# Patient Record
Sex: Male | Born: 1945
Health system: Southern US, Community
[De-identification: ages and names within clinical notes are randomized; demographics above are authoritative.]

## PROBLEM LIST (undated history)

## (undated) DIAGNOSIS — I10 Essential (primary) hypertension: Principal | ICD-10-CM

## (undated) DIAGNOSIS — E119 Type 2 diabetes mellitus without complications: Secondary | ICD-10-CM

## (undated) DIAGNOSIS — I2510 Atherosclerotic heart disease of native coronary artery without angina pectoris: Secondary | ICD-10-CM

## (undated) DIAGNOSIS — I208 Other forms of angina pectoris: Secondary | ICD-10-CM

## (undated) DIAGNOSIS — Z794 Long term (current) use of insulin: Secondary | ICD-10-CM

## (undated) DIAGNOSIS — Z87442 Personal history of urinary calculi: Secondary | ICD-10-CM

## (undated) DIAGNOSIS — M1990 Unspecified osteoarthritis, unspecified site: Secondary | ICD-10-CM

## (undated) DIAGNOSIS — E785 Hyperlipidemia, unspecified: Secondary | ICD-10-CM

## (undated) HISTORY — PX: CORONARY ANGIOPLASTY: SHX604

## (undated) HISTORY — DX: Long term (current) use of insulin: Z79.4

## (undated) HISTORY — DX: Type 2 diabetes mellitus without complications: E11.9

## (undated) HISTORY — DX: Essential (primary) hypertension: I10

## (undated) HISTORY — DX: Atherosclerotic heart disease of native coronary artery without angina pectoris: I25.10

## (undated) HISTORY — DX: Other forms of angina pectoris: I20.8

## (undated) HISTORY — PX: APPENDECTOMY: SHX54

## (undated) HISTORY — PX: TONSILLECTOMY: SUR1361

---

## 2004-06-29 ENCOUNTER — Encounter: Admission: RE | Admit: 2004-06-29 | Discharge: 2004-09-27 | Payer: Self-pay | Admitting: Family Medicine

## 2009-08-26 ENCOUNTER — Inpatient Hospital Stay (HOSPITAL_COMMUNITY): Admission: EM | Admit: 2009-08-26 | Discharge: 2009-08-29 | Payer: Self-pay | Admitting: Emergency Medicine

## 2010-06-05 LAB — CROSSMATCH: Antibody Screen: NEGATIVE

## 2010-06-05 LAB — DIFFERENTIAL
Basophils Absolute: 0 10*3/uL (ref 0.0–0.1)
Eosinophils Relative: 0 % (ref 0–5)
Lymphocytes Relative: 18 % (ref 12–46)
Lymphocytes Relative: 32 % (ref 12–46)
Lymphs Abs: 1.1 10*3/uL (ref 0.7–4.0)
Monocytes Absolute: 0.3 10*3/uL (ref 0.1–1.0)
Monocytes Absolute: 0.5 10*3/uL (ref 0.1–1.0)
Monocytes Relative: 9 % (ref 3–12)
Neutro Abs: 2 10*3/uL (ref 1.7–7.7)
Neutro Abs: 4.4 10*3/uL (ref 1.7–7.7)

## 2010-06-05 LAB — POCT I-STAT, CHEM 8
BUN: 37 mg/dL — ABNORMAL HIGH (ref 6–23)
BUN: 39 mg/dL — ABNORMAL HIGH (ref 6–23)
Calcium, Ion: 1.07 mmol/L — ABNORMAL LOW (ref 1.12–1.32)
Calcium, Ion: 1.14 mmol/L (ref 1.12–1.32)
Chloride: 106 mEq/L (ref 96–112)
Chloride: 111 mEq/L (ref 96–112)
Creatinine, Ser: 0.8 mg/dL (ref 0.4–1.5)
Glucose, Bld: 241 mg/dL — ABNORMAL HIGH (ref 70–99)
HCT: 20 % — ABNORMAL LOW (ref 39.0–52.0)
Potassium: 3.9 mEq/L (ref 3.5–5.1)
Sodium: 137 mEq/L (ref 135–145)

## 2010-06-05 LAB — PREPARE RBC (CROSSMATCH)

## 2010-06-05 LAB — HEMOGLOBIN A1C
Hgb A1c MFr Bld: 6.1 % — ABNORMAL HIGH (ref <5.7)
Mean Plasma Glucose: 128 mg/dL — ABNORMAL HIGH (ref <117)

## 2010-06-05 LAB — CBC
HCT: 21.5 % — ABNORMAL LOW (ref 39.0–52.0)
HCT: 30.8 % — ABNORMAL LOW (ref 39.0–52.0)
Hemoglobin: 11 g/dL — ABNORMAL LOW (ref 13.0–17.0)
Hemoglobin: 7.7 g/dL — ABNORMAL LOW (ref 13.0–17.0)
Hemoglobin: 7.9 g/dL — ABNORMAL LOW (ref 13.0–17.0)
MCHC: 35.1 g/dL (ref 30.0–36.0)
MCHC: 36 g/dL (ref 30.0–36.0)
MCV: 91.7 fL (ref 78.0–100.0)
RBC: 2.37 MIL/uL — ABNORMAL LOW (ref 4.22–5.81)
RBC: 2.4 MIL/uL — ABNORMAL LOW (ref 4.22–5.81)
RBC: 2.43 MIL/uL — ABNORMAL LOW (ref 4.22–5.81)
RDW: 13.9 % (ref 11.5–15.5)
RDW: 14.3 % (ref 11.5–15.5)
WBC: 6.1 10*3/uL (ref 4.0–10.5)

## 2010-06-05 LAB — TSH: TSH: 1.156 u[IU]/mL (ref 0.350–4.500)

## 2010-06-05 LAB — BASIC METABOLIC PANEL
BUN: 19 mg/dL (ref 6–23)
CO2: 24 mEq/L (ref 19–32)
Chloride: 112 mEq/L (ref 96–112)
Creatinine, Ser: 0.87 mg/dL (ref 0.4–1.5)
GFR calc Af Amer: >60 mL/min (ref >60)
GFR calc non Af Amer: >60 mL/min (ref >60)
Glucose, Bld: 147 mg/dL — ABNORMAL HIGH (ref 70–99)
Glucose, Bld: 164 mg/dL — ABNORMAL HIGH (ref 70–99)
Potassium: 3.5 mEq/L (ref 3.5–5.1)
Sodium: 139 mEq/L (ref 135–145)

## 2010-06-05 LAB — GLUCOSE, CAPILLARY
Glucose-Capillary: 168 mg/dL — ABNORMAL HIGH (ref 70–99)
Glucose-Capillary: 247 mg/dL — ABNORMAL HIGH (ref 70–99)

## 2010-06-05 LAB — TROPONIN I: Troponin I: 0.01 ng/mL (ref 0.00–0.06)

## 2012-09-01 DIAGNOSIS — L570 Actinic keratosis: Secondary | ICD-10-CM | POA: Diagnosis not present

## 2012-09-01 DIAGNOSIS — D239 Other benign neoplasm of skin, unspecified: Secondary | ICD-10-CM | POA: Diagnosis not present

## 2012-09-01 DIAGNOSIS — D485 Neoplasm of uncertain behavior of skin: Secondary | ICD-10-CM | POA: Diagnosis not present

## 2012-09-01 DIAGNOSIS — Z85828 Personal history of other malignant neoplasm of skin: Secondary | ICD-10-CM | POA: Diagnosis not present

## 2012-09-02 DIAGNOSIS — D235 Other benign neoplasm of skin of trunk: Secondary | ICD-10-CM | POA: Diagnosis not present

## 2012-09-02 DIAGNOSIS — D0439 Carcinoma in situ of skin of other parts of face: Secondary | ICD-10-CM | POA: Diagnosis not present

## 2012-12-01 DIAGNOSIS — L570 Actinic keratosis: Secondary | ICD-10-CM | POA: Diagnosis not present

## 2012-12-01 DIAGNOSIS — D0439 Carcinoma in situ of skin of other parts of face: Secondary | ICD-10-CM | POA: Diagnosis not present

## 2013-04-28 DIAGNOSIS — E11319 Type 2 diabetes mellitus with unspecified diabetic retinopathy without macular edema: Secondary | ICD-10-CM | POA: Diagnosis not present

## 2013-04-28 DIAGNOSIS — E119 Type 2 diabetes mellitus without complications: Secondary | ICD-10-CM | POA: Diagnosis not present

## 2013-07-27 DIAGNOSIS — L570 Actinic keratosis: Secondary | ICD-10-CM | POA: Diagnosis not present

## 2013-07-27 DIAGNOSIS — Z85828 Personal history of other malignant neoplasm of skin: Secondary | ICD-10-CM | POA: Diagnosis not present

## 2013-07-27 DIAGNOSIS — D239 Other benign neoplasm of skin, unspecified: Secondary | ICD-10-CM | POA: Diagnosis not present

## 2013-08-19 DIAGNOSIS — L570 Actinic keratosis: Secondary | ICD-10-CM | POA: Diagnosis not present

## 2013-10-26 DIAGNOSIS — L570 Actinic keratosis: Secondary | ICD-10-CM | POA: Diagnosis not present

## 2013-10-26 DIAGNOSIS — Z85828 Personal history of other malignant neoplasm of skin: Secondary | ICD-10-CM | POA: Diagnosis not present

## 2013-10-27 DIAGNOSIS — I10 Essential (primary) hypertension: Secondary | ICD-10-CM | POA: Diagnosis not present

## 2013-10-27 DIAGNOSIS — E119 Type 2 diabetes mellitus without complications: Secondary | ICD-10-CM | POA: Diagnosis not present

## 2013-10-30 DIAGNOSIS — M549 Dorsalgia, unspecified: Secondary | ICD-10-CM | POA: Diagnosis not present

## 2013-10-30 DIAGNOSIS — Z0000 Encounter for general adult medical examination without abnormal findings: Secondary | ICD-10-CM | POA: Diagnosis not present

## 2013-10-30 DIAGNOSIS — Z23 Encounter for immunization: Secondary | ICD-10-CM | POA: Diagnosis not present

## 2013-10-30 DIAGNOSIS — I10 Essential (primary) hypertension: Secondary | ICD-10-CM | POA: Diagnosis not present

## 2013-10-30 DIAGNOSIS — Z86010 Personal history of colonic polyps: Secondary | ICD-10-CM | POA: Diagnosis not present

## 2013-10-30 DIAGNOSIS — E118 Type 2 diabetes mellitus with unspecified complications: Secondary | ICD-10-CM | POA: Diagnosis not present

## 2013-10-30 DIAGNOSIS — R809 Proteinuria, unspecified: Secondary | ICD-10-CM | POA: Diagnosis not present

## 2013-10-30 DIAGNOSIS — E785 Hyperlipidemia, unspecified: Secondary | ICD-10-CM | POA: Diagnosis not present

## 2013-10-30 DIAGNOSIS — L570 Actinic keratosis: Secondary | ICD-10-CM | POA: Diagnosis not present

## 2013-11-12 DIAGNOSIS — L570 Actinic keratosis: Secondary | ICD-10-CM | POA: Diagnosis not present

## 2014-01-13 DIAGNOSIS — L570 Actinic keratosis: Secondary | ICD-10-CM | POA: Diagnosis not present

## 2014-01-13 DIAGNOSIS — E785 Hyperlipidemia, unspecified: Secondary | ICD-10-CM | POA: Diagnosis not present

## 2014-01-13 DIAGNOSIS — E118 Type 2 diabetes mellitus with unspecified complications: Secondary | ICD-10-CM | POA: Diagnosis not present

## 2014-01-13 DIAGNOSIS — R300 Dysuria: Secondary | ICD-10-CM | POA: Diagnosis not present

## 2014-01-13 DIAGNOSIS — Z79899 Other long term (current) drug therapy: Secondary | ICD-10-CM | POA: Diagnosis not present

## 2014-01-21 DIAGNOSIS — S0502XA Injury of conjunctiva and corneal abrasion without foreign body, left eye, initial encounter: Secondary | ICD-10-CM | POA: Diagnosis not present

## 2014-04-15 DIAGNOSIS — E118 Type 2 diabetes mellitus with unspecified complications: Secondary | ICD-10-CM | POA: Diagnosis not present

## 2014-04-15 DIAGNOSIS — E782 Mixed hyperlipidemia: Secondary | ICD-10-CM | POA: Diagnosis not present

## 2014-04-15 DIAGNOSIS — I10 Essential (primary) hypertension: Secondary | ICD-10-CM | POA: Diagnosis not present

## 2014-04-15 DIAGNOSIS — R809 Proteinuria, unspecified: Secondary | ICD-10-CM | POA: Diagnosis not present

## 2014-08-09 DIAGNOSIS — L821 Other seborrheic keratosis: Secondary | ICD-10-CM | POA: Diagnosis not present

## 2014-08-09 DIAGNOSIS — D225 Melanocytic nevi of trunk: Secondary | ICD-10-CM | POA: Diagnosis not present

## 2014-08-09 DIAGNOSIS — L570 Actinic keratosis: Secondary | ICD-10-CM | POA: Diagnosis not present

## 2014-08-09 DIAGNOSIS — Z85828 Personal history of other malignant neoplasm of skin: Secondary | ICD-10-CM | POA: Diagnosis not present

## 2015-01-11 DIAGNOSIS — S61459A Open bite of unspecified hand, initial encounter: Secondary | ICD-10-CM | POA: Diagnosis not present

## 2015-02-03 DIAGNOSIS — Z7984 Long term (current) use of oral hypoglycemic drugs: Secondary | ICD-10-CM | POA: Diagnosis not present

## 2015-02-03 DIAGNOSIS — E119 Type 2 diabetes mellitus without complications: Secondary | ICD-10-CM | POA: Diagnosis not present

## 2015-02-08 DIAGNOSIS — E119 Type 2 diabetes mellitus without complications: Secondary | ICD-10-CM | POA: Diagnosis not present

## 2015-02-08 DIAGNOSIS — Z0000 Encounter for general adult medical examination without abnormal findings: Secondary | ICD-10-CM | POA: Diagnosis not present

## 2015-02-08 DIAGNOSIS — Z125 Encounter for screening for malignant neoplasm of prostate: Secondary | ICD-10-CM | POA: Diagnosis not present

## 2015-02-08 DIAGNOSIS — E1121 Type 2 diabetes mellitus with diabetic nephropathy: Secondary | ICD-10-CM | POA: Diagnosis not present

## 2015-02-08 DIAGNOSIS — D126 Benign neoplasm of colon, unspecified: Secondary | ICD-10-CM | POA: Diagnosis not present

## 2015-02-08 DIAGNOSIS — E785 Hyperlipidemia, unspecified: Secondary | ICD-10-CM | POA: Diagnosis not present

## 2015-02-08 DIAGNOSIS — I10 Essential (primary) hypertension: Secondary | ICD-10-CM | POA: Diagnosis not present

## 2015-02-08 DIAGNOSIS — R809 Proteinuria, unspecified: Secondary | ICD-10-CM | POA: Diagnosis not present

## 2015-02-08 DIAGNOSIS — Z7984 Long term (current) use of oral hypoglycemic drugs: Secondary | ICD-10-CM | POA: Diagnosis not present

## 2015-02-08 DIAGNOSIS — E782 Mixed hyperlipidemia: Secondary | ICD-10-CM | POA: Diagnosis not present

## 2015-04-05 DIAGNOSIS — R972 Elevated prostate specific antigen [PSA]: Secondary | ICD-10-CM | POA: Diagnosis not present

## 2015-05-04 DIAGNOSIS — R972 Elevated prostate specific antigen [PSA]: Secondary | ICD-10-CM | POA: Diagnosis not present

## 2015-06-14 DIAGNOSIS — Z0000 Encounter for general adult medical examination without abnormal findings: Secondary | ICD-10-CM | POA: Diagnosis not present

## 2015-06-14 DIAGNOSIS — R972 Elevated prostate specific antigen [PSA]: Secondary | ICD-10-CM | POA: Diagnosis not present

## 2015-08-08 DIAGNOSIS — E785 Hyperlipidemia, unspecified: Secondary | ICD-10-CM | POA: Diagnosis not present

## 2015-08-08 DIAGNOSIS — Z7984 Long term (current) use of oral hypoglycemic drugs: Secondary | ICD-10-CM | POA: Diagnosis not present

## 2015-08-08 DIAGNOSIS — E118 Type 2 diabetes mellitus with unspecified complications: Secondary | ICD-10-CM | POA: Diagnosis not present

## 2015-08-08 DIAGNOSIS — R809 Proteinuria, unspecified: Secondary | ICD-10-CM | POA: Diagnosis not present

## 2015-08-08 DIAGNOSIS — I10 Essential (primary) hypertension: Secondary | ICD-10-CM | POA: Diagnosis not present

## 2015-08-08 DIAGNOSIS — R972 Elevated prostate specific antigen [PSA]: Secondary | ICD-10-CM | POA: Diagnosis not present

## 2015-08-11 DIAGNOSIS — Z85828 Personal history of other malignant neoplasm of skin: Secondary | ICD-10-CM | POA: Diagnosis not present

## 2015-08-11 DIAGNOSIS — L570 Actinic keratosis: Secondary | ICD-10-CM | POA: Diagnosis not present

## 2015-08-11 DIAGNOSIS — D225 Melanocytic nevi of trunk: Secondary | ICD-10-CM | POA: Diagnosis not present

## 2015-08-11 DIAGNOSIS — L821 Other seborrheic keratosis: Secondary | ICD-10-CM | POA: Diagnosis not present

## 2015-11-01 DIAGNOSIS — R350 Frequency of micturition: Secondary | ICD-10-CM | POA: Diagnosis not present

## 2015-11-01 DIAGNOSIS — N401 Enlarged prostate with lower urinary tract symptoms: Secondary | ICD-10-CM | POA: Diagnosis not present

## 2015-11-02 DIAGNOSIS — L820 Inflamed seborrheic keratosis: Secondary | ICD-10-CM | POA: Diagnosis not present

## 2015-11-02 DIAGNOSIS — L570 Actinic keratosis: Secondary | ICD-10-CM | POA: Diagnosis not present

## 2015-11-02 DIAGNOSIS — D692 Other nonthrombocytopenic purpura: Secondary | ICD-10-CM | POA: Diagnosis not present

## 2015-11-02 DIAGNOSIS — D485 Neoplasm of uncertain behavior of skin: Secondary | ICD-10-CM | POA: Diagnosis not present

## 2015-11-17 DIAGNOSIS — L570 Actinic keratosis: Secondary | ICD-10-CM | POA: Diagnosis not present

## 2015-12-12 DIAGNOSIS — R972 Elevated prostate specific antigen [PSA]: Secondary | ICD-10-CM | POA: Diagnosis not present

## 2016-01-10 DIAGNOSIS — L570 Actinic keratosis: Secondary | ICD-10-CM | POA: Diagnosis not present

## 2016-01-10 DIAGNOSIS — Z23 Encounter for immunization: Secondary | ICD-10-CM | POA: Diagnosis not present

## 2016-02-13 DIAGNOSIS — Z7984 Long term (current) use of oral hypoglycemic drugs: Secondary | ICD-10-CM | POA: Diagnosis not present

## 2016-02-13 DIAGNOSIS — N401 Enlarged prostate with lower urinary tract symptoms: Secondary | ICD-10-CM | POA: Diagnosis not present

## 2016-02-13 DIAGNOSIS — E782 Mixed hyperlipidemia: Secondary | ICD-10-CM | POA: Diagnosis not present

## 2016-02-13 DIAGNOSIS — Z0000 Encounter for general adult medical examination without abnormal findings: Secondary | ICD-10-CM | POA: Diagnosis not present

## 2016-02-13 DIAGNOSIS — Z23 Encounter for immunization: Secondary | ICD-10-CM | POA: Diagnosis not present

## 2016-02-13 DIAGNOSIS — D126 Benign neoplasm of colon, unspecified: Secondary | ICD-10-CM | POA: Diagnosis not present

## 2016-02-13 DIAGNOSIS — R972 Elevated prostate specific antigen [PSA]: Secondary | ICD-10-CM | POA: Diagnosis not present

## 2016-02-13 DIAGNOSIS — I10 Essential (primary) hypertension: Secondary | ICD-10-CM | POA: Diagnosis not present

## 2016-02-13 DIAGNOSIS — E1121 Type 2 diabetes mellitus with diabetic nephropathy: Secondary | ICD-10-CM | POA: Diagnosis not present

## 2016-02-13 DIAGNOSIS — R809 Proteinuria, unspecified: Secondary | ICD-10-CM | POA: Diagnosis not present

## 2016-04-02 DIAGNOSIS — N401 Enlarged prostate with lower urinary tract symptoms: Secondary | ICD-10-CM | POA: Diagnosis not present

## 2016-04-02 DIAGNOSIS — E1121 Type 2 diabetes mellitus with diabetic nephropathy: Secondary | ICD-10-CM | POA: Diagnosis not present

## 2016-04-02 DIAGNOSIS — Z125 Encounter for screening for malignant neoplasm of prostate: Secondary | ICD-10-CM | POA: Diagnosis not present

## 2016-04-02 DIAGNOSIS — I10 Essential (primary) hypertension: Secondary | ICD-10-CM | POA: Diagnosis not present

## 2016-04-02 DIAGNOSIS — E782 Mixed hyperlipidemia: Secondary | ICD-10-CM | POA: Diagnosis not present

## 2016-04-02 DIAGNOSIS — Z7984 Long term (current) use of oral hypoglycemic drugs: Secondary | ICD-10-CM | POA: Diagnosis not present

## 2016-04-02 DIAGNOSIS — R809 Proteinuria, unspecified: Secondary | ICD-10-CM | POA: Diagnosis not present

## 2016-07-05 DIAGNOSIS — Z7984 Long term (current) use of oral hypoglycemic drugs: Secondary | ICD-10-CM | POA: Diagnosis not present

## 2016-07-05 DIAGNOSIS — E119 Type 2 diabetes mellitus without complications: Secondary | ICD-10-CM | POA: Diagnosis not present

## 2016-07-05 DIAGNOSIS — Z125 Encounter for screening for malignant neoplasm of prostate: Secondary | ICD-10-CM | POA: Diagnosis not present

## 2016-07-05 DIAGNOSIS — E782 Mixed hyperlipidemia: Secondary | ICD-10-CM | POA: Diagnosis not present

## 2016-07-05 DIAGNOSIS — I10 Essential (primary) hypertension: Secondary | ICD-10-CM | POA: Diagnosis not present

## 2016-07-05 DIAGNOSIS — M542 Cervicalgia: Secondary | ICD-10-CM | POA: Diagnosis not present

## 2016-07-05 DIAGNOSIS — R809 Proteinuria, unspecified: Secondary | ICD-10-CM | POA: Diagnosis not present

## 2016-07-05 DIAGNOSIS — E1121 Type 2 diabetes mellitus with diabetic nephropathy: Secondary | ICD-10-CM | POA: Diagnosis not present

## 2016-07-05 DIAGNOSIS — M4692 Unspecified inflammatory spondylopathy, cervical region: Secondary | ICD-10-CM | POA: Diagnosis not present

## 2016-08-27 DIAGNOSIS — E1121 Type 2 diabetes mellitus with diabetic nephropathy: Secondary | ICD-10-CM | POA: Diagnosis not present

## 2016-08-27 DIAGNOSIS — Z7984 Long term (current) use of oral hypoglycemic drugs: Secondary | ICD-10-CM | POA: Diagnosis not present

## 2016-08-27 DIAGNOSIS — I10 Essential (primary) hypertension: Secondary | ICD-10-CM | POA: Diagnosis not present

## 2016-08-27 DIAGNOSIS — E782 Mixed hyperlipidemia: Secondary | ICD-10-CM | POA: Diagnosis not present

## 2016-08-27 DIAGNOSIS — M542 Cervicalgia: Secondary | ICD-10-CM | POA: Diagnosis not present

## 2016-08-27 DIAGNOSIS — M4692 Unspecified inflammatory spondylopathy, cervical region: Secondary | ICD-10-CM | POA: Diagnosis not present

## 2016-08-27 DIAGNOSIS — E119 Type 2 diabetes mellitus without complications: Secondary | ICD-10-CM | POA: Diagnosis not present

## 2016-08-28 DIAGNOSIS — L570 Actinic keratosis: Secondary | ICD-10-CM | POA: Diagnosis not present

## 2016-08-28 DIAGNOSIS — Z808 Family history of malignant neoplasm of other organs or systems: Secondary | ICD-10-CM | POA: Diagnosis not present

## 2016-08-28 DIAGNOSIS — R58 Hemorrhage, not elsewhere classified: Secondary | ICD-10-CM | POA: Diagnosis not present

## 2016-08-28 DIAGNOSIS — Z85828 Personal history of other malignant neoplasm of skin: Secondary | ICD-10-CM | POA: Diagnosis not present

## 2016-08-30 DIAGNOSIS — M7741 Metatarsalgia, right foot: Secondary | ICD-10-CM | POA: Diagnosis not present

## 2016-09-05 DIAGNOSIS — M5412 Radiculopathy, cervical region: Secondary | ICD-10-CM | POA: Diagnosis not present

## 2016-09-20 ENCOUNTER — Ambulatory Visit (INDEPENDENT_AMBULATORY_CARE_PROVIDER_SITE_OTHER): Payer: Medicare Other

## 2016-09-20 ENCOUNTER — Ambulatory Visit (INDEPENDENT_AMBULATORY_CARE_PROVIDER_SITE_OTHER): Payer: Medicare Other | Admitting: Podiatry

## 2016-09-20 VITALS — BP 148/91 | HR 73

## 2016-09-20 DIAGNOSIS — M779 Enthesopathy, unspecified: Principal | ICD-10-CM

## 2016-09-20 DIAGNOSIS — M2041 Other hammer toe(s) (acquired), right foot: Secondary | ICD-10-CM

## 2016-09-20 DIAGNOSIS — M7751 Other enthesopathy of right foot: Secondary | ICD-10-CM | POA: Diagnosis not present

## 2016-09-20 DIAGNOSIS — M79671 Pain in right foot: Secondary | ICD-10-CM

## 2016-09-20 DIAGNOSIS — M778 Other enthesopathies, not elsewhere classified: Secondary | ICD-10-CM

## 2016-09-20 NOTE — Progress Notes (Signed)
He presents today with a chief complaint of pain to the forefoot right. He states has been bothering for about a year states that recently he cut the bottom out of the second metatarsal area of his right shoe. He says the last of all kidneys have pain in the second metatarsophalangeal joint for quite some time.  Objective: I have reviewed his past medical history medications allergy surgery social history. Vital signs are stable slightly elevated diastolic blood pressure. Neurologic sensorium is intact. Deep tendon reflexes are intact. Muscle strength normal symmetrical bilateral. Orthopedic evaluation demonstrates mildly contracted second metatarsophalangeal joint with medial deviation of the toes bilaterally right greater than left. He has pain on palpation of the second metatarsophalangeal joint.  Assessment: Capsulitis hammertoe deformity second right.  Plan: Placed padding to the plantar aspect of his insoles. Discussed injection therapy he will follow-up with me on an as-needed basis.

## 2016-10-22 DIAGNOSIS — M5412 Radiculopathy, cervical region: Secondary | ICD-10-CM | POA: Diagnosis not present

## 2016-10-23 DIAGNOSIS — M7751 Other enthesopathy of right foot: Secondary | ICD-10-CM | POA: Diagnosis not present

## 2016-10-23 DIAGNOSIS — G5761 Lesion of plantar nerve, right lower limb: Secondary | ICD-10-CM | POA: Diagnosis not present

## 2016-10-30 DIAGNOSIS — M7751 Other enthesopathy of right foot: Secondary | ICD-10-CM | POA: Diagnosis not present

## 2016-10-30 DIAGNOSIS — M542 Cervicalgia: Secondary | ICD-10-CM | POA: Diagnosis not present

## 2016-10-30 DIAGNOSIS — M4722 Other spondylosis with radiculopathy, cervical region: Secondary | ICD-10-CM | POA: Diagnosis not present

## 2016-10-30 DIAGNOSIS — G5761 Lesion of plantar nerve, right lower limb: Secondary | ICD-10-CM | POA: Diagnosis not present

## 2016-11-02 DIAGNOSIS — M542 Cervicalgia: Secondary | ICD-10-CM | POA: Diagnosis not present

## 2016-11-02 DIAGNOSIS — M4722 Other spondylosis with radiculopathy, cervical region: Secondary | ICD-10-CM | POA: Diagnosis not present

## 2016-11-06 DIAGNOSIS — M542 Cervicalgia: Secondary | ICD-10-CM | POA: Diagnosis not present

## 2016-11-06 DIAGNOSIS — M4722 Other spondylosis with radiculopathy, cervical region: Secondary | ICD-10-CM | POA: Diagnosis not present

## 2016-11-07 DIAGNOSIS — M7751 Other enthesopathy of right foot: Secondary | ICD-10-CM | POA: Diagnosis not present

## 2016-11-07 DIAGNOSIS — G5761 Lesion of plantar nerve, right lower limb: Secondary | ICD-10-CM | POA: Diagnosis not present

## 2016-11-09 DIAGNOSIS — M542 Cervicalgia: Secondary | ICD-10-CM | POA: Diagnosis not present

## 2016-11-09 DIAGNOSIS — M4722 Other spondylosis with radiculopathy, cervical region: Secondary | ICD-10-CM | POA: Diagnosis not present

## 2016-11-26 DIAGNOSIS — M542 Cervicalgia: Secondary | ICD-10-CM | POA: Diagnosis not present

## 2016-11-26 DIAGNOSIS — M4722 Other spondylosis with radiculopathy, cervical region: Secondary | ICD-10-CM | POA: Diagnosis not present

## 2016-11-27 DIAGNOSIS — E782 Mixed hyperlipidemia: Secondary | ICD-10-CM | POA: Diagnosis not present

## 2016-11-27 DIAGNOSIS — M4692 Unspecified inflammatory spondylopathy, cervical region: Secondary | ICD-10-CM | POA: Diagnosis not present

## 2016-11-27 DIAGNOSIS — Z7984 Long term (current) use of oral hypoglycemic drugs: Secondary | ICD-10-CM | POA: Diagnosis not present

## 2016-11-27 DIAGNOSIS — E1121 Type 2 diabetes mellitus with diabetic nephropathy: Secondary | ICD-10-CM | POA: Diagnosis not present

## 2016-11-27 DIAGNOSIS — I10 Essential (primary) hypertension: Secondary | ICD-10-CM | POA: Diagnosis not present

## 2016-11-27 DIAGNOSIS — F439 Reaction to severe stress, unspecified: Secondary | ICD-10-CM | POA: Diagnosis not present

## 2016-12-03 DIAGNOSIS — R350 Frequency of micturition: Secondary | ICD-10-CM | POA: Diagnosis not present

## 2016-12-04 ENCOUNTER — Other Ambulatory Visit: Payer: Self-pay | Admitting: Orthopedic Surgery

## 2016-12-04 DIAGNOSIS — M542 Cervicalgia: Secondary | ICD-10-CM

## 2016-12-12 DIAGNOSIS — M7751 Other enthesopathy of right foot: Secondary | ICD-10-CM | POA: Diagnosis not present

## 2016-12-12 DIAGNOSIS — G5761 Lesion of plantar nerve, right lower limb: Secondary | ICD-10-CM | POA: Diagnosis not present

## 2016-12-13 DIAGNOSIS — E119 Type 2 diabetes mellitus without complications: Secondary | ICD-10-CM | POA: Diagnosis not present

## 2016-12-13 DIAGNOSIS — R972 Elevated prostate specific antigen [PSA]: Secondary | ICD-10-CM | POA: Diagnosis not present

## 2016-12-13 DIAGNOSIS — T733XXA Exhaustion due to excessive exertion, initial encounter: Secondary | ICD-10-CM | POA: Diagnosis not present

## 2016-12-13 DIAGNOSIS — Z7984 Long term (current) use of oral hypoglycemic drugs: Secondary | ICD-10-CM | POA: Diagnosis not present

## 2016-12-13 DIAGNOSIS — Z23 Encounter for immunization: Secondary | ICD-10-CM | POA: Diagnosis not present

## 2016-12-13 DIAGNOSIS — I10 Essential (primary) hypertension: Secondary | ICD-10-CM | POA: Diagnosis not present

## 2016-12-13 DIAGNOSIS — R42 Dizziness and giddiness: Secondary | ICD-10-CM | POA: Diagnosis not present

## 2016-12-13 DIAGNOSIS — E782 Mixed hyperlipidemia: Secondary | ICD-10-CM | POA: Diagnosis not present

## 2016-12-13 DIAGNOSIS — E1121 Type 2 diabetes mellitus with diabetic nephropathy: Secondary | ICD-10-CM | POA: Diagnosis not present

## 2016-12-16 ENCOUNTER — Other Ambulatory Visit: Payer: Self-pay

## 2016-12-16 ENCOUNTER — Ambulatory Visit
Admission: RE | Admit: 2016-12-16 | Discharge: 2016-12-16 | Disposition: A | Discharge status: Home / Self Care | Payer: Self-pay | Source: Ambulatory Visit | Attending: Orthopedic Surgery | Admitting: Orthopedic Surgery

## 2016-12-16 DIAGNOSIS — M4802 Spinal stenosis, cervical region: Secondary | ICD-10-CM | POA: Diagnosis not present

## 2016-12-16 DIAGNOSIS — M542 Cervicalgia: Secondary | ICD-10-CM

## 2016-12-24 DIAGNOSIS — R6889 Other general symptoms and signs: Secondary | ICD-10-CM | POA: Diagnosis not present

## 2016-12-24 DIAGNOSIS — I10 Essential (primary) hypertension: Secondary | ICD-10-CM | POA: Diagnosis not present

## 2016-12-24 DIAGNOSIS — E119 Type 2 diabetes mellitus without complications: Secondary | ICD-10-CM | POA: Diagnosis not present

## 2016-12-24 DIAGNOSIS — Z794 Long term (current) use of insulin: Secondary | ICD-10-CM | POA: Diagnosis not present

## 2016-12-25 DIAGNOSIS — I208 Other forms of angina pectoris: Secondary | ICD-10-CM | POA: Diagnosis not present

## 2016-12-31 DIAGNOSIS — I208 Other forms of angina pectoris: Secondary | ICD-10-CM | POA: Diagnosis not present

## 2017-01-16 DIAGNOSIS — M47812 Spondylosis without myelopathy or radiculopathy, cervical region: Secondary | ICD-10-CM | POA: Diagnosis not present

## 2017-01-16 DIAGNOSIS — E119 Type 2 diabetes mellitus without complications: Secondary | ICD-10-CM | POA: Diagnosis not present

## 2017-01-16 DIAGNOSIS — M542 Cervicalgia: Secondary | ICD-10-CM | POA: Diagnosis not present

## 2017-01-16 DIAGNOSIS — M4722 Other spondylosis with radiculopathy, cervical region: Secondary | ICD-10-CM | POA: Diagnosis not present

## 2017-01-16 DIAGNOSIS — Z794 Long term (current) use of insulin: Secondary | ICD-10-CM | POA: Diagnosis not present

## 2017-01-16 DIAGNOSIS — I208 Other forms of angina pectoris: Secondary | ICD-10-CM | POA: Diagnosis not present

## 2017-01-16 DIAGNOSIS — R6889 Other general symptoms and signs: Secondary | ICD-10-CM | POA: Diagnosis not present

## 2017-01-21 DIAGNOSIS — G5761 Lesion of plantar nerve, right lower limb: Secondary | ICD-10-CM | POA: Diagnosis not present

## 2017-01-21 DIAGNOSIS — M7751 Other enthesopathy of right foot: Secondary | ICD-10-CM | POA: Diagnosis not present

## 2017-01-21 DIAGNOSIS — N401 Enlarged prostate with lower urinary tract symptoms: Secondary | ICD-10-CM | POA: Diagnosis not present

## 2017-01-21 DIAGNOSIS — R972 Elevated prostate specific antigen [PSA]: Secondary | ICD-10-CM | POA: Diagnosis not present

## 2017-01-21 DIAGNOSIS — I208 Other forms of angina pectoris: Secondary | ICD-10-CM | POA: Diagnosis not present

## 2017-01-21 DIAGNOSIS — R3912 Poor urinary stream: Secondary | ICD-10-CM | POA: Diagnosis not present

## 2017-01-23 DIAGNOSIS — R9439 Abnormal result of other cardiovascular function study: Secondary | ICD-10-CM

## 2017-01-23 NOTE — Progress Notes (Signed)
Labs 01/21/2017: BUN/creatinine 21/1.09.  EGFR 69.  Glucose 278 elevated.  Sodium 142, potassium 4.4. H/H 13.6/39.8.  MCV 93.  Platelets 202.  INR 1.1.

## 2017-01-23 NOTE — Progress Notes (Signed)
[name] 2016-12-30 8:41 AM Location: [facility] Patient #: [id number] DOB: [birth year] Divorced / Language: Cleophus Molt / Race: White Male   History of Present Illness Joya Gaskins Patwardhan MD; 12/30/16 2:06 PM) Patient words: NP EVAL for fatigue due to excessive exertion.  The patient is a 71 year old male who presents with fatigue. Patient referred by Yaakov Guthrie, M.D. for evaluation of fatigue and decreased exercise tolerance.  Mr. Nouri is a delightful 72 year old Caucasian male with new diagnosis hypertension, controlled type II diabetes mellitus, hyperlipidemia. 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 For the last few months, he has noticed markedly decreased exercise tolerance. He is taking longer to the same amount of exercise on ellipticals, and is requiring to break his crunches and 2-3 sessions every day. Few weeks ago, he was in [place] lifting heavy cases of antiques something which is done for many years. He noticed that he was getting tired easily. He was recently started on lisinopril 20 mg by Dr. Jacelyn Grip. Since then his blood pressure is better control. He does notice decreased appetite and brief episodes of lightheadedness since starting on lisinopril but is getting better. He denies any angina since this. He does endorse flashes of chest pain as he describes lasting for a few seconds , but nothing that is brought on by exertion or relieved with rest. He does not have any obvious shortness of breath. He denies any leg swelling or thought, PND, syncope.    Problem List/Past Medical Anderson Malta Sergeant; December 30, 2016 1:00 PM) Laboratory examination (Z01.89)  Benign essential hypertension (I10)  Controlled type 2  diabetes mellitus without complication, with long-term current use of insulin (E11.9)  Hyperlipidemia (E78.5)   Allergies Anderson Malta Sergeant; 12-30-16 1:00 PM) No Known Drug Allergies [12-30-16]:  Family History Anderson Malta Sergeant; December 30, 2016 1:01 PM) Mother  Deceased. at age 59 from Bladder cancer, No heart issues Father  Deceased. at age 97, cancer, no heart issues Brother 2  Younger, no heart issues  Social History Anderson Malta Sergeant; 30-Dec-2016 1:03 PM) Current tobacco use  Never smoker. Alcohol Use  Occasional alcohol use. Marital status  Single. Living Situation  Lives alone. Number of Children  0.  Past Surgical History Anderson Malta Sergeant; Dec 30, 2016 1:04 PM) Appendectomy [2008]: Tonsillectomy [1960]:  Medication History Anderson Malta Sergeant; 12/30/16 1:07 PM) Lisinopril (20MG  Tablet, 1 Oral daily) Active. MetFORMIN HCl (1000MG  Tablet, 1 Oral two times daily) Active. Aspirin (325MG  Tablet DR, 1 Oral daily) Active. Medications Reconciled (meds present)  Diagnostic Studies History Anderson Malta Sergeant; 30-Dec-2016 1:07 PM) Colonoscopy [2016]: removed polyps Endoscopy [2014]: ulcer Prostate testing [2016]:    Review of Systems Joya Gaskins Patwardhan MD; 2016/12/30 2:07 PM) General Not Present- Appetite Loss and Weight Gain. Respiratory Not Present- Chronic Cough and Wakes up from Sleep Wheezing or Short of Breath. Cardiovascular Present- Elevated Blood Pressure. Not Present- Chest Pain, Claudications, Difficulty Breathing Lying Down, Difficulty Breathing On Exertion, Edema and Orthopnea. Note: markedly decreased exercise tolerance Gastrointestinal Not Present- Black, Tarry Stool and Difficulty Swallowing. Musculoskeletal Present- Back Pain. Not Present- Decreased Range of Motion and Muscle Atrophy. Neurological Not Present- Attention Deficit. Note: right foot pain, likely neuroma. Working with a podiatrist. Psychiatric Not Present- Personality Changes and  Suicidal Ideation. Endocrine Not Present- Cold Intolerance  and Heat Intolerance. Hematology Not Present- Abnormal Bleeding. All other systems negative  Vitals Anderson Malta Sergeant; 12/24/2016 1:15 PM) 12/24/2016 12:58 PM Weight: 244.25 lb Height: 77in Body Surface Area: 2.43 m Body Mass Index: 28.96 kg/m  Pulse: 84 (Regular)  P.OX: 98% (Room air) BP: 125/76 (Sitting, Left Arm, Standard)       Physical Exam Joya Gaskins Patwardhan MD; 12/24/2016 2:08 PM) General Mental Status-Alert. General Appearance-Cooperative and Appears stated age. Build & Nutrition-Well built and Morbidly obese(well-built, 6\' 5"  tall).  Head and Neck Thyroid Gland Characteristics - normal size and consistency and no palpable nodules.  Chest and Lung Exam Chest and lung exam reveals -quiet, even and easy respiratory effort with no use of accessory muscles, non-tender and on auscultation, normal breath sounds, no adventitious sounds.  Cardiovascular Cardiovascular examination reveals -normal heart sounds, regular rate and rhythm with no murmurs, carotid auscultation reveals no bruits, abdominal aorta auscultation reveals no bruits and no prominent pulsation and femoral artery auscultation bilaterally reveals normal pulses, no bruits, no thrills.  Abdomen Palpation/Percussion Palpation and Percussion of the abdomen reveal - Non Tender and No hepatosplenomegaly.  Peripheral Vascular Lower Extremity Inspection - Bilateral - Inspection Normal. Palpation - Dorsalis pedis pulse - Bilateral - 2+. Posterior tibial pulse - Bilateral - 2+. Carotid arteries - Bilateral-No Carotid bruit.  Neurologic Neurologic evaluation reveals -alert and oriented x 3 with no impairment of recent or remote memory. Motor-Grossly intact without any focal deficits.  Musculoskeletal Global Assessment Left Lower Extremity - no deformities, masses or tenderness, no known fractures. Right Lower Extremity - no  deformities, masses or tenderness, no known fractures.    Assessment & Plan Joya Gaskins Patwardhan MD; 12/24/2016 2:14 PM) Decreased exercise tolerance (R68.89) Story: Possible angina equivalent in diabetic patient Current Plans Started Aspirin 81 81MG , 2 (two) Tablet daily, #180, 90 days starting 12/24/2016, Ref. x2. Controlled type 2 diabetes mellitus without complication, with long-term current use of insulin (E11.9) Benign essential hypertension (I10) Story: EKG 12/25/2016: Sinus arrhythmia 72 bpm. Normal axis. Borderline first-degree AV block. Otherwise normal conduction. Current Plans Complete electrocardiogram (93000) Mixed hyperlipidemia (E78.2)  Note:. Recommendations:  Markedly decreased exercise tolerance and an otherwise active 71 year old man with hypertension, hyperlipidemia, and type II diabetes mellitus. I'm concerned that this may be an angina equivalent. Recommend echocardiogram and exercise stress test with nuclear imaging. His blood pressure is well controlled on lisinopril 20 mg. I have asked him to decrease aspirin to 162 mg set of 325 mg to reduce bleeding risk. As long as no liver dysfunction, he can take Tylenol for his arthritis pain. Continue metformin for diabetes. Given his diabetes, and hyperlipidemia, his 10 year ASCVD risk is 40%. I recommend starting high intensity statin, but he is reluctant to start new medications at this time. We have mutually agreed to wait for the stress test and readdress the question of lipid lowering therapy. He is going to try and make changes to his diet.  He is planning to go to [place] on her trip in 2 weeks from now. He will get the ball testing done after that. I will see him after the test.  Cc Yaakov Guthrie, MD  Signed electronically by Vernell Leep, MD (12/24/2016 2:15 PM)

## 2017-01-28 ENCOUNTER — Encounter (HOSPITAL_COMMUNITY)
Admission: RE | Disposition: A | Discharge status: Home / Self Care | Payer: Self-pay | Source: Ambulatory Visit | Attending: Cardiology

## 2017-01-28 ENCOUNTER — Observation Stay (HOSPITAL_COMMUNITY)
Admission: RE | Admit: 2017-01-28 | Discharge: 2017-01-29 | Disposition: A | Discharge status: Home / Self Care | Payer: Medicare Other | Source: Ambulatory Visit | Attending: Cardiology | Admitting: Cardiology

## 2017-01-28 ENCOUNTER — Encounter (HOSPITAL_COMMUNITY): Payer: Self-pay | Admitting: Cardiology

## 2017-01-28 DIAGNOSIS — R9439 Abnormal result of other cardiovascular function study: Secondary | ICD-10-CM | POA: Diagnosis not present

## 2017-01-28 DIAGNOSIS — E119 Type 2 diabetes mellitus without complications: Secondary | ICD-10-CM | POA: Diagnosis not present

## 2017-01-28 DIAGNOSIS — E782 Mixed hyperlipidemia: Secondary | ICD-10-CM | POA: Insufficient documentation

## 2017-01-28 DIAGNOSIS — Z7982 Long term (current) use of aspirin: Secondary | ICD-10-CM | POA: Insufficient documentation

## 2017-01-28 DIAGNOSIS — Z9861 Coronary angioplasty status: Secondary | ICD-10-CM

## 2017-01-28 DIAGNOSIS — I208 Other forms of angina pectoris: Secondary | ICD-10-CM | POA: Diagnosis present

## 2017-01-28 DIAGNOSIS — I10 Essential (primary) hypertension: Secondary | ICD-10-CM | POA: Insufficient documentation

## 2017-01-28 DIAGNOSIS — Z955 Presence of coronary angioplasty implant and graft: Secondary | ICD-10-CM

## 2017-01-28 DIAGNOSIS — Z794 Long term (current) use of insulin: Secondary | ICD-10-CM | POA: Insufficient documentation

## 2017-01-28 DIAGNOSIS — I25118 Atherosclerotic heart disease of native coronary artery with other forms of angina pectoris: Secondary | ICD-10-CM | POA: Diagnosis not present

## 2017-01-28 DIAGNOSIS — R0609 Other forms of dyspnea: Secondary | ICD-10-CM | POA: Diagnosis not present

## 2017-01-28 HISTORY — PX: ULTRASOUND GUIDANCE FOR VASCULAR ACCESS: SHX6516

## 2017-01-28 HISTORY — DX: Hyperlipidemia, unspecified: E78.5

## 2017-01-28 HISTORY — DX: Personal history of urinary calculi: Z87.442

## 2017-01-28 HISTORY — PX: CORONARY STENT INTERVENTION: CATH118234

## 2017-01-28 HISTORY — DX: Essential (primary) hypertension: I10

## 2017-01-28 HISTORY — DX: Atherosclerotic heart disease of native coronary artery without angina pectoris: I25.10

## 2017-01-28 HISTORY — DX: Unspecified osteoarthritis, unspecified site: M19.90

## 2017-01-28 HISTORY — DX: Type 2 diabetes mellitus without complications: E11.9

## 2017-01-28 HISTORY — PX: LEFT HEART CATH AND CORONARY ANGIOGRAPHY: CATH118249

## 2017-01-28 LAB — GLUCOSE, CAPILLARY
GLUCOSE-CAPILLARY: 162 mg/dL — AB (ref 65–99)
GLUCOSE-CAPILLARY: 205 mg/dL — AB (ref 65–99)
Glucose-Capillary: 155 mg/dL — ABNORMAL HIGH (ref 65–99)
Glucose-Capillary: 201 mg/dL — ABNORMAL HIGH (ref 65–99)

## 2017-01-28 LAB — POCT ACTIVATED CLOTTING TIME
Activated Clotting Time: 230 seconds
Activated Clotting Time: 488 seconds
Activated Clotting Time: 731 seconds

## 2017-01-28 SURGERY — LEFT HEART CATH AND CORONARY ANGIOGRAPHY
Anesthesia: LOCAL

## 2017-01-28 MED ORDER — LABETALOL HCL 5 MG/ML IV SOLN
INTRAVENOUS | Status: DC | PRN
  Administered 2017-01-28: 10 mg via INTRAVENOUS

## 2017-01-28 MED ORDER — HYDRALAZINE HCL 20 MG/ML IJ SOLN
INTRAMUSCULAR | Status: AC
  Administered 2017-01-28: 10 mg via INTRAVENOUS
  Filled 2017-01-28: qty 1

## 2017-01-28 MED ORDER — HEPARIN SODIUM (PORCINE) 1000 UNIT/ML IJ SOLN
INTRAMUSCULAR | Status: AC
  Filled 2017-01-28: qty 1

## 2017-01-28 MED ORDER — SODIUM BICARBONATE 650 MG PO TABS
325.0000 mg | ORAL_TABLET | Freq: Every day | ORAL | Status: DC | PRN
  Filled 2017-01-28: qty 0.5

## 2017-01-28 MED ORDER — MIDAZOLAM HCL 2 MG/2ML IJ SOLN
INTRAMUSCULAR | Status: AC
  Filled 2017-01-28: qty 2

## 2017-01-28 MED ORDER — LISINOPRIL 10 MG PO TABS
20.0000 mg | ORAL_TABLET | Freq: Every day | ORAL | Status: DC
  Administered 2017-01-28 – 2017-01-29 (×2): 20 mg via ORAL
  Filled 2017-01-28: qty 1
  Filled 2017-01-28: qty 2

## 2017-01-28 MED ORDER — HEPARIN (PORCINE) IN NACL 2-0.9 UNIT/ML-% IJ SOLN
INTRAMUSCULAR | Status: AC
  Filled 2017-01-28: qty 1000

## 2017-01-28 MED ORDER — HEPARIN (PORCINE) IN NACL 2-0.9 UNIT/ML-% IJ SOLN
INTRAMUSCULAR | Status: AC | PRN
  Administered 2017-01-28: 1000 mL

## 2017-01-28 MED ORDER — SODIUM CHLORIDE 0.9 % IV SOLN: 250.0000 mL | INTRAVENOUS | Status: DC | PRN

## 2017-01-28 MED ORDER — VERAPAMIL HCL 2.5 MG/ML IV SOLN
INTRAVENOUS | Status: DC | PRN
  Administered 2017-01-28: 5 mL via INTRA_ARTERIAL

## 2017-01-28 MED ORDER — ANGIOPLASTY BOOK
Freq: Once | Status: AC
  Administered 2017-01-28: 1
  Filled 2017-01-28: qty 1

## 2017-01-28 MED ORDER — SODIUM CHLORIDE 0.9% FLUSH: 3.0000 mL | Freq: Two times a day (BID) | INTRAVENOUS | Status: DC

## 2017-01-28 MED ORDER — IOPAMIDOL (ISOVUE-370) INJECTION 76%
INTRAVENOUS | Status: AC
  Filled 2017-01-28: qty 100

## 2017-01-28 MED ORDER — IOPAMIDOL (ISOVUE-370) INJECTION 76%
INTRAVENOUS | Status: AC
  Filled 2017-01-28: qty 50

## 2017-01-28 MED ORDER — LABETALOL HCL 5 MG/ML IV SOLN
INTRAVENOUS | Status: AC
  Filled 2017-01-28: qty 4

## 2017-01-28 MED ORDER — MIDAZOLAM HCL 2 MG/2ML IJ SOLN
INTRAMUSCULAR | Status: DC | PRN
  Administered 2017-01-28 (×3): 1 mg via INTRAVENOUS

## 2017-01-28 MED ORDER — HYDRALAZINE HCL 20 MG/ML IJ SOLN
10.0000 mg | Freq: Once | INTRAMUSCULAR | Status: AC
  Administered 2017-01-28: 10 mg via INTRAVENOUS

## 2017-01-28 MED ORDER — CLOPIDOGREL BISULFATE 300 MG PO TABS
ORAL_TABLET | ORAL | Status: AC
  Filled 2017-01-28: qty 2

## 2017-01-28 MED ORDER — INSULIN ASPART 100 UNIT/ML ~~LOC~~ SOLN: 0.0000 [IU] | Freq: Every day | SUBCUTANEOUS | Status: DC

## 2017-01-28 MED ORDER — IOPAMIDOL (ISOVUE-370) INJECTION 76%
INTRAVENOUS | Status: AC
Start: 2017-01-28 — End: 2017-01-28
  Filled 2017-01-28: qty 100

## 2017-01-28 MED ORDER — METOPROLOL SUCCINATE ER 25 MG PO TB24: 25.0000 mg | ORAL_TABLET | Freq: Every day | ORAL | 3 refills | Status: DC

## 2017-01-28 MED ORDER — CLOPIDOGREL BISULFATE 75 MG PO TABS
75.0000 mg | ORAL_TABLET | Freq: Every day | ORAL | Status: DC
  Administered 2017-01-29: 09:00:00 75 mg via ORAL
  Filled 2017-01-28: qty 1

## 2017-01-28 MED ORDER — AMLODIPINE BESYLATE 5 MG PO TABS
5.0000 mg | ORAL_TABLET | Freq: Once | ORAL | Status: AC
  Administered 2017-01-28: 5 mg via ORAL
  Filled 2017-01-28: qty 1

## 2017-01-28 MED ORDER — AMLODIPINE BESYLATE 10 MG PO TABS
10.0000 mg | ORAL_TABLET | Freq: Every day | ORAL | Status: DC
  Administered 2017-01-29: 07:00:00 10 mg via ORAL
  Filled 2017-01-28 (×2): qty 1

## 2017-01-28 MED ORDER — LIDOCAINE HCL (PF) 1 % IJ SOLN
INTRAMUSCULAR | Status: AC
  Filled 2017-01-28: qty 30

## 2017-01-28 MED ORDER — CLOPIDOGREL BISULFATE 75 MG PO TABS: 75.0000 mg | ORAL_TABLET | Freq: Every day | ORAL | 3 refills | Status: DC

## 2017-01-28 MED ORDER — INSULIN ASPART 100 UNIT/ML ~~LOC~~ SOLN
0.0000 [IU] | Freq: Three times a day (TID) | SUBCUTANEOUS | Status: DC
  Administered 2017-01-29: 2 [IU] via SUBCUTANEOUS

## 2017-01-28 MED ORDER — ASPIRIN 81 MG PO CHEW
81.0000 mg | CHEWABLE_TABLET | ORAL | Status: AC
  Administered 2017-01-28: 81 mg via ORAL

## 2017-01-28 MED ORDER — SODIUM CHLORIDE 0.9% FLUSH: 3.0000 mL | INTRAVENOUS | Status: DC | PRN

## 2017-01-28 MED ORDER — HEPARIN SODIUM (PORCINE) 1000 UNIT/ML IJ SOLN
INTRAMUSCULAR | Status: DC | PRN
  Administered 2017-01-28 (×2): 3000 [IU] via INTRAVENOUS
  Administered 2017-01-28: 6000 [IU] via INTRAVENOUS
  Administered 2017-01-28: 2000 [IU] via INTRAVENOUS

## 2017-01-28 MED ORDER — METOPROLOL SUCCINATE ER 25 MG PO TB24
25.0000 mg | ORAL_TABLET | Freq: Every day | ORAL | Status: DC
  Administered 2017-01-28 – 2017-01-29 (×2): 25 mg via ORAL
  Filled 2017-01-28 (×2): qty 1

## 2017-01-28 MED ORDER — ASPIRIN 81 MG PO CHEW
CHEWABLE_TABLET | ORAL | Status: AC
  Administered 2017-01-28: 81 mg via ORAL
  Filled 2017-01-28: qty 1

## 2017-01-28 MED ORDER — CLOPIDOGREL BISULFATE 300 MG PO TABS
ORAL_TABLET | ORAL | Status: DC | PRN
  Administered 2017-01-28: 600 mg via ORAL

## 2017-01-28 MED ORDER — LABETALOL HCL 5 MG/ML IV SOLN
10.0000 mg | INTRAVENOUS | Status: AC | PRN
  Filled 2017-01-28: qty 4

## 2017-01-28 MED ORDER — ACETAMINOPHEN 325 MG PO TABS: 650.0000 mg | ORAL_TABLET | ORAL | Status: DC | PRN

## 2017-01-28 MED ORDER — LIDOCAINE HCL (PF) 1 % IJ SOLN
INTRAMUSCULAR | Status: DC | PRN
  Administered 2017-01-28: 2 mL

## 2017-01-28 MED ORDER — SODIUM CHLORIDE 0.9% FLUSH
3.0000 mL | Freq: Two times a day (BID) | INTRAVENOUS | Status: DC
  Administered 2017-01-28: 18:00:00 3 mL via INTRAVENOUS

## 2017-01-28 MED ORDER — ONDANSETRON HCL 4 MG/2ML IJ SOLN: 4.0000 mg | Freq: Four times a day (QID) | INTRAMUSCULAR | Status: DC | PRN

## 2017-01-28 MED ORDER — TAMSULOSIN HCL 0.4 MG PO CAPS
0.4000 mg | ORAL_CAPSULE | Freq: Every day | ORAL | Status: DC
  Administered 2017-01-28: 0.4 mg via ORAL
  Filled 2017-01-28: qty 1

## 2017-01-28 MED ORDER — IOPAMIDOL (ISOVUE-370) INJECTION 76%
INTRAVENOUS | Status: DC | PRN
  Administered 2017-01-28: 310 mL via INTRAVENOUS

## 2017-01-28 MED ORDER — SODIUM CHLORIDE 0.9 % IV SOLN: INTRAVENOUS | Status: AC

## 2017-01-28 MED ORDER — NICARDIPINE HCL IN NACL 20-0.86 MG/200ML-% IV SOLN
5.0000 mg/h | INTRAVENOUS | Status: DC
  Administered 2017-01-28: 2.5 mg/h via INTRAVENOUS
  Administered 2017-01-28: 17:00:00 5 mg/h via INTRAVENOUS
  Filled 2017-01-28 (×2): qty 200

## 2017-01-28 MED ORDER — FENTANYL CITRATE (PF) 100 MCG/2ML IJ SOLN
INTRAMUSCULAR | Status: DC | PRN
  Administered 2017-01-28 (×3): 25 ug via INTRAVENOUS

## 2017-01-28 MED ORDER — SODIUM CHLORIDE 0.9 % IV SOLN
INTRAVENOUS | Status: DC
  Administered 2017-01-28: 06:00:00 via INTRAVENOUS

## 2017-01-28 MED ORDER — VERAPAMIL HCL 2.5 MG/ML IV SOLN
INTRAVENOUS | Status: AC
  Filled 2017-01-28: qty 2

## 2017-01-28 MED ORDER — FENTANYL CITRATE (PF) 100 MCG/2ML IJ SOLN
INTRAMUSCULAR | Status: AC
  Filled 2017-01-28: qty 2

## 2017-01-28 SURGICAL SUPPLY — 26 items
BALLN EUPHORA RX 2.5X12 (BALLOONS) ×3
BALLN MINITREK OTW 1.5X6 (BALLOONS) ×3
BALLN ~~LOC~~ EUPHORA RX 4.5X12 (BALLOONS) ×3
BALLOON EUPHORA RX 2.5X12 (BALLOONS) IMPLANT
BALLOON MINITREK OTW 1.5X6 (BALLOONS) IMPLANT
BALLOON ~~LOC~~ EUPHORA RX 4.5X12 (BALLOONS) IMPLANT
CATH EXPO 5F FL3.5 (CATHETERS) ×1 IMPLANT
CATH INFINITI 5F JL4 125CM (CATHETERS) ×1 IMPLANT
CATH INFINITI 5FR JR4 125CM (CATHETERS) ×1 IMPLANT
CATH VISTA GUIDE 6FR JR4 (CATHETERS) ×1 IMPLANT
COVER PRB 48X5XTLSCP FOLD TPE (BAG) IMPLANT
COVER PROBE 5X48 (BAG) ×3
DEVICE RAD COMP TR BAND LRG (VASCULAR PRODUCTS) ×1 IMPLANT
GLIDESHEATH SLEND A-KIT 6F 20G (SHEATH) ×1 IMPLANT
GUIDEWIRE INQWIRE 1.5J.035X260 (WIRE) IMPLANT
INQWIRE 1.5J .035X260CM (WIRE) ×3
KIT ENCORE 26 ADVANTAGE (KITS) ×1 IMPLANT
KIT HEART LEFT (KITS) ×3 IMPLANT
PACK CARDIAC CATHETERIZATION (CUSTOM PROCEDURE TRAY) ×3 IMPLANT
STENT RESOLUTE ONYX 2.25X12 (Permanent Stent) ×1 IMPLANT
STENT RESOLUTE ONYX 4.0X30 (Permanent Stent) ×1 IMPLANT
TRANSDUCER W/STOPCOCK (MISCELLANEOUS) ×3 IMPLANT
TUBING CIL FLEX 10 FLL-RA (TUBING) ×3 IMPLANT
VALVE GUARDIAN II ~~LOC~~ HEMO (MISCELLANEOUS) ×1 IMPLANT
WIRE RUNTHROUGH .014X180CM (WIRE) ×1 IMPLANT
WIRE RUNTHROUGH .014X300CM (WIRE) ×1 IMPLANT

## 2017-01-28 NOTE — Progress Notes (Signed)
Per Dr Virgina Jock discontinued IV fluids

## 2017-01-28 NOTE — Progress Notes (Signed)
Report called to [facility] for 6-c-07 and client transferred via w/c with cardiac monitor

## 2017-01-28 NOTE — H&P (Signed)
[name] 04-Jan-2017 8:41 AM Location: [facility] Patient #: [id number] DOB: Oct 22, 1945 Divorced / Language: [name] / Race: White Male   History of Present Illness [name] Brandon Roa MD; 01/04/2017 2:06 PM) Patient words: NP EVAL for fatigue due to excessive exertion.  The patient is a 71 year old male who presents with fatigue. Patient referred by [name], M.D. for evaluation of fatigue and decreased exercise tolerance.  [name] is a delightful 71 year old Caucasian male with new diagnosis hypertension, controlled type II diabetes mellitus, hyperlipidemia. 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 For the last few months, he has noticed markedly decreased exercise tolerance. He is taking longer to the same amount of exercise on ellipticals, and is requiring to break his crunches and 2-3 sessions every day. Few weeks ago, he was in [place] lifting heavy cases of antiques something which is done for many years. He noticed that he was getting tired easily. He was recently started on lisinopril 20 mg by Dr. Jacelyn Barajas. Since then his blood pressure is better control. He does notice decreased appetite and brief episodes of lightheadedness since starting on lisinopril but is getting better. He denies any angina since this. He does endorse flashes of chest pain as he describes lasting for a few seconds , but nothing that is brought on by exertion or relieved with rest. He does not have any obvious shortness of breath. He denies any leg swelling or thought, PND, syncope.    Problem List/Past Medical [name]; January 04, 2017 1:00 PM) Laboratory examination (Z01.89)  Benign essential hypertension (I10)  Controlled  type 2 diabetes mellitus without complication, with long-term current use of insulin (E11.9)  Hyperlipidemia (E78.5)   Allergies [name]; January 04, 2017 1:00 PM) No Known Drug Allergies [01/04/17]:  Family History [name]; Jan 04, 2017 1:01 PM) Mother  Deceased. at age 35 from Bladder cancer, No heart issues Father  Deceased. at age 43, cancer, no heart issues Brother 2  Younger, no heart issues  Social History [name]; 2017-01-04 1:03 PM) Current tobacco use  Never smoker. Alcohol Use  Occasional alcohol use. Marital status  Single. Living Situation  Lives alone. Number of Children  0.  Past Surgical History [name]; 2017-01-04 1:04 PM) Appendectomy [2008]: Tonsillectomy [1960]:  Medication History [name]; 01/04/2017 1:07 PM) Lisinopril (20MG  Tablet, 1 Oral daily) Active. MetFORMIN HCl (1000MG  Tablet, 1 Oral two times daily) Active. Aspirin (325MG  Tablet DR, 1 Oral daily) Active. Medications Reconciled (meds present)  Diagnostic Studies History [name]; 01-04-17 1:07 PM) Colonoscopy [2016]: removed polyps Endoscopy [2014]: ulcer Prostate testing [2016]:    Review of Systems [name] Brandon Cohill MD; January 04, 2017 2:07 PM) General Not Present- Appetite Loss and Weight Gain. Respiratory Not Present- Chronic Cough and Wakes up from Sleep Wheezing or Short of Breath. Cardiovascular Present- Elevated Blood Pressure. Not Present- Chest Pain, Claudications, Difficulty Breathing Lying Down, Difficulty Breathing On Exertion, Edema and Orthopnea. Note: markedly decreased exercise tolerance Gastrointestinal Not Present- Black, Tarry Stool and Difficulty Swallowing. Musculoskeletal Present- Back Pain. Not Present- Decreased Range of Motion and Muscle Atrophy. Neurological Not Present- Attention Deficit. Note: right foot pain, likely neuroma. Working with a podiatrist. Psychiatric Not Present- Personality  Changes and Suicidal Ideation. Endocrine Not Present- Cold Intolerance  and Heat Intolerance. Hematology Not Present- Abnormal Bleeding. All other systems negative  Vitals [name]; 12/24/2016 1:15 PM) 12/24/2016 12:58 PM Weight: 244.25 lb Height: 77in Body Surface Area: 2.43 m Body Mass Index: 28.96 kg/m  Pulse: 84 (Regular)  P.OX: 98% (Room air) BP: 125/76 (Sitting, Left Arm, Standard)       Physical Exam [name] Brandon Audi MD; 12/24/2016 2:08 PM) General Mental Status-Alert. General Appearance-Cooperative and Appears stated age. Build & Nutrition-Well built and Morbidly obese(well-built, 6\' 5"  tall).  Head and Neck Thyroid Gland Characteristics - normal size and consistency and no palpable nodules.  Chest and Lung Exam Chest and lung exam reveals -quiet, even and easy respiratory effort with no use of accessory muscles, non-tender and on auscultation, normal breath sounds, no adventitious sounds.  Cardiovascular Cardiovascular examination reveals -normal heart sounds, regular rate and rhythm with no murmurs, carotid auscultation reveals no bruits, abdominal aorta auscultation reveals no bruits and no prominent pulsation and femoral artery auscultation bilaterally reveals normal pulses, no bruits, no thrills.  Abdomen Palpation/Percussion Palpation and Percussion of the abdomen reveal - Non Tender and No hepatosplenomegaly.  Peripheral Vascular Lower Extremity Inspection - Bilateral - Inspection Normal. Palpation - Dorsalis pedis pulse - Bilateral - 2+. Posterior tibial pulse - Bilateral - 2+. Carotid arteries - Bilateral-No Carotid bruit.  Neurologic Neurologic evaluation reveals -alert and oriented x 3 with no impairment of recent or remote memory. Motor-Grossly intact without any focal deficits.  Musculoskeletal Global Assessment Left Lower Extremity - no deformities, masses or tenderness, no known fractures. Right  Lower Extremity - no deformities, masses or tenderness, no known fractures.    Assessment & Plan [name] Brandon Shearn MD; 12/24/2016 2:14 PM) Decreased exercise tolerance (R68.89) Story: Possible angina equivalent in diabetic patient Current Plans Started Aspirin 81 81MG , 2 (two) Tablet daily, #180, 90 days starting 12/24/2016, Ref. x2. Controlled type 2 diabetes mellitus without complication, with long-term current use of insulin (E11.9) Benign essential hypertension (I10) Story: EKG 12/25/2016: Sinus arrhythmia 72 bpm. Normal axis. Borderline first-degree AV block. Otherwise normal conduction. Current Plans Complete electrocardiogram (93000) Mixed hyperlipidemia (E78.2)  Note:. Recommendations:  Markedly decreased exercise tolerance and an otherwise active 71 year old man with hypertension, hyperlipidemia, and type II diabetes mellitus. I'm concerned that this may be an angina equivalent. Recommend echocardiogram and exercise stress test with nuclear imaging. His blood pressure is well controlled on lisinopril 20 mg. I have asked him to decrease aspirin to 162 mg set of 325 mg to reduce bleeding risk. As long as no liver dysfunction, he can take Tylenol for his arthritis pain. Continue metformin for diabetes. Given his diabetes, and hyperlipidemia, his 10 year ASCVD risk is 40%. I recommend starting high intensity statin, but he is reluctant to start new medications at this time. We have mutually agreed to wait for the stress test and readdress the question of lipid lowering therapy. He is going to try and make changes to his diet.  He is planning to go to [place] on her trip in 2 weeks from now. He will get the ball testing done after that. I will see him after the test.  Cc Brandon Guthrie, MD  Signed electronically by Vernell Leep, MD (12/24/2016 2:15 PM)

## 2017-01-28 NOTE — Interval H&P Note (Signed)
History and Physical Interval Note:  01/28/2017 7:29 AM  [name]  has presented today for surgery, with the diagnosis of unstable angina - abnormal stress  The various methods of treatment have been discussed with the patient and family. After consideration of risks, benefits and other options for treatment, the patient has consented to  Procedure(s): LEFT HEART CATH AND CORONARY ANGIOGRAPHY (N/A) as a surgical intervention .  The patient's history has been reviewed, patient examined, no change in status, stable for surgery.  I have reviewed the patient's chart and labs.  Questions were answered to the patient's satisfaction.    2016/2017 Appropriate Use Criteria for Coronary Revascularization Clinical Presentation: Diabetes Mellitus? Symptom Status? S/P CABG? Antianginal Therapy (# of long-acting drugs)? Results of Non-invasive testing? FFR/iFR results in all diseased vessels? Patient undergoing renal transplant? Patient undergoing percutaneous valve procedure (TAVR, MitraClip, Others)? Symptom Status:  Ischemic Symptoms  Non-invasive Testing:  Intermediate Risk  If no or indeterminate stress test, FFR/iFR results in all diseased vessels:  N/A  Diabetes Mellitus:  Yes  S/P CABG:  No  Antianginal therapy (number of long-acting drugs):  0  Patient undergoing renal transplant:  No  Patient undergoing percutaneous valve procedure:  No    newline 1 Vessel Disease PCI CABG  No proximal LAD involvement, No proximal left dominant LCX involvement M (5); Indication 2 M (4); Indication 2   Proximal left dominant LCX involvement M (6); Indication 5 M (6); Indication 5   Proximal LAD involvement M (6); Indication 5 M (6); Indication 5   newline 2 Vessel Disease  No proximal LAD involvement M (6); Indication 8 M (5); Indication 8   Proximal LAD involvement M (6); Indication 14 A (7); Indication 14   newline 3 Vessel Disease  Low disease complexity (e.g., focal stenoses, SYNTAX <=22) M  (6); Indication 19 A (8); Indication 19   Intermediate or high disease complexity (e.g., SYNTAX >=23) M (5); Indication 23 A (8); Indication 23   newline Left Main Disease  Isolated LMCA disease: ostial or midshaft A (7); Indication 24 A (8); Indication 24   Isolated LMCA disease: bifurcation involvement M (5); Indication 25 A (8); Indication 25   LMCA ostial or midshaft, concurrent low disease burden multivessel disease (e.g., 1-2 additional focal stenoses, SYNTAX <=22) M (6); Indication 26 A (9); Indication 26   LMCA ostial or midshaft, concurrent intermediate or high disease burden multivessel disease (e.g., 1-2 additional bifurcation stenoses, long stenoses, SYNTAX >=23) M (4); Indication 27 A (9); Indication 27   LMCA bifurcation involvement, concurrent low disease burden multivessel disease (e.g., 1-2 additional focal stenoses, SYNTAX <=22) M (5); Indication 28 A (8); Indication 28   LMCA bifurcation involvement, concurrent intermediate or high disease burden multivessel disease (e.g., 1-2 additional bifurcation stenoses, long stenoses, SYNTAX >=23) R (3); Indication 29 A (9); Indication [place]

## 2017-01-28 NOTE — Progress Notes (Signed)
CARDIAC REHAB PHASE I  Pt post PCI, for same day discharge. Completed PCI/stent education with pt and significant other at bedside.  Reviewed risk factors, anti-platelet therapy, stent card, activity restrictions, ntg, exercise, heart healthy and diabetes diet handouts and phase 2 cardiac rehab. Pt verbalized understanding, asked appropriate questions. Pt agrees to phase 2 cardiac rehab referral, very interested in program, will send referral to [facility]. Pt in bed, call bell within reach.    [phone] Lenna Sciara, RN, BSN 01/28/2017 12:09 PM

## 2017-01-28 NOTE — Progress Notes (Signed)
Dr Virgina Jock notified of B/P and order noted

## 2017-01-29 ENCOUNTER — Encounter (HOSPITAL_COMMUNITY): Payer: Self-pay | Admitting: General Practice

## 2017-01-29 ENCOUNTER — Other Ambulatory Visit: Payer: Self-pay

## 2017-01-29 DIAGNOSIS — I2510 Atherosclerotic heart disease of native coronary artery without angina pectoris: Secondary | ICD-10-CM | POA: Diagnosis not present

## 2017-01-29 DIAGNOSIS — E782 Mixed hyperlipidemia: Secondary | ICD-10-CM | POA: Diagnosis not present

## 2017-01-29 DIAGNOSIS — Z794 Long term (current) use of insulin: Secondary | ICD-10-CM | POA: Diagnosis not present

## 2017-01-29 DIAGNOSIS — I25118 Atherosclerotic heart disease of native coronary artery with other forms of angina pectoris: Secondary | ICD-10-CM | POA: Diagnosis not present

## 2017-01-29 DIAGNOSIS — Z7982 Long term (current) use of aspirin: Secondary | ICD-10-CM | POA: Diagnosis not present

## 2017-01-29 DIAGNOSIS — E119 Type 2 diabetes mellitus without complications: Secondary | ICD-10-CM | POA: Diagnosis not present

## 2017-01-29 DIAGNOSIS — I10 Essential (primary) hypertension: Secondary | ICD-10-CM | POA: Diagnosis not present

## 2017-01-29 LAB — BASIC METABOLIC PANEL
Anion gap: 9 (ref 5–15)
BUN: 13 mg/dL (ref 6–20)
CALCIUM: 8.9 mg/dL (ref 8.9–10.3)
CHLORIDE: 104 mmol/L (ref 101–111)
CO2: 24 mmol/L (ref 22–32)
CREATININE: 0.94 mg/dL (ref 0.61–1.24)
GFR calc Af Amer: >60 mL/min (ref >60)
GFR calc non Af Amer: >60 mL/min (ref >60)
GLUCOSE: 170 mg/dL — AB (ref 65–99)
Potassium: 3.6 mmol/L (ref 3.5–5.1)
Sodium: 137 mmol/L (ref 135–145)

## 2017-01-29 LAB — GLUCOSE, CAPILLARY: GLUCOSE-CAPILLARY: 157 mg/dL — AB (ref 65–99)

## 2017-01-29 LAB — CBC
HCT: 36.9 % — ABNORMAL LOW (ref 39.0–52.0)
Hemoglobin: 13 g/dL (ref 13.0–17.0)
MCH: 31.4 pg (ref 26.0–34.0)
MCHC: 35.2 g/dL (ref 30.0–36.0)
MCV: 89.1 fL (ref 78.0–100.0)
PLATELETS: 151 10*3/uL (ref 150–400)
RBC: 4.14 MIL/uL — AB (ref 4.22–5.81)
RDW: 13.7 % (ref 11.5–15.5)
WBC: 5.5 10*3/uL (ref 4.0–10.5)

## 2017-01-29 MED ORDER — AMLODIPINE BESYLATE 10 MG PO TABS: 10.0000 mg | ORAL_TABLET | Freq: Every day | ORAL | 3 refills | Status: DC

## 2017-01-29 MED ORDER — ASPIRIN 81 MG PO CHEW: 81.0000 mg | CHEWABLE_TABLET | Freq: Every day | ORAL | 3 refills | Status: DC

## 2017-01-29 MED ORDER — ASPIRIN 81 MG PO CHEW: 81.0000 mg | CHEWABLE_TABLET | Freq: Every day | ORAL | Status: DC

## 2017-01-29 NOTE — Care Management Obs Status (Signed)
[place] NOTIFICATION   Patient Details  Name: HERMON ZEA MRN: [id number] Date of Birth: 1945/11/13   Medicare Observation Status Notification Given:  Yes    Zenon Mayo, RN 01/29/2017, 9:42 AM

## 2017-01-29 NOTE — Discharge Summary (Addendum)
Physician Discharge Summary  Patient ID: [name] MRN: [id number] DOB/AGE: Sep 30, 1945 71 y.o.  Admit date: 01/28/2017 Discharge date: 01/29/2017  Admission Diagnoses: Exertional dyspnea/angina equivalent Abnormal stress test Hypertension Type 2 Diabetes melitus  Discharge Diagnoses:  CAD s/p PCI prox-mid RCA, PDA Hypertension Type 2 Diabetes melitus  Discharged Condition: good  Hospital Course:  [name] is a 71 y.o. Male With hypertension, type 2 diabetes mellitus, with exertional dyspnea, underwent exercise nuclear stress test. He achieved 7.5 minutes. Electrocardiographic and scintigraphic positive stress test with medium scar of inferolateral segment with mild residual ischemia. EF 54%.  Cath showed severe prox-mid RCA and PDA stenoses, consistent with abnormalities seen on stress test. Small vessel LCx/OM have moderate disease. He underwent successful PCI as below. During and after the procedure, his blood pressure was noted to be significantly elevated to 200/100 mmHg. This is unusual for him, as his blood pressure was 120s/70s on lisinopril 40 mg prior to this. He was started on nicardipine drip, which was weaned off 11/13 morning. He was started on amlodipine 10 mg and metoprolol succinate 25 mg. He was also started on plavix 75 mg. He should continue ASA/lisinopril. He should resume metformin on 01/31/17. He was seen by cardiac rehab. I will see him in clinic for f/u on 02/05/17.  Consults: Cardiac rehab  Significant Diagnostic Studies: labs:  Results for ADONNIS, SALCEDA (MRN [id number]) as of 01/29/2017 08:31  Ref. Range 01/29/2017 38:25  BASIC METABOLIC PANEL Unknown Rpt (A)  Sodium Latest Ref Range: 135 - 145 mmol/L 137  Potassium Latest Ref Range: 3.5 - 5.1 mmol/L 3.6  Chloride Latest Ref Range: 101 - 111 mmol/L 104  CO2 Latest Ref Range: 22 - 32 mmol/L 24  Glucose Latest Ref Range: 65 - 99 mg/dL 170 (H)  BUN Latest Ref Range: 6 - 20 mg/dL 13  Creatinine  Latest Ref Range: 0.61 - 1.24 mg/dL 0.94  Calcium Latest Ref Range: 8.9 - 10.3 mg/dL 8.9  Anion gap Latest Ref Range: 5 - 15  9  GFR, Est Non African American Latest Ref Range: >60 mL/min >60  GFR, Est African American Latest Ref Range: >60 mL/min >60   Results for ESLI, JERNIGAN (MRN [id number]) as of 01/29/2017 08:31  Ref. Range 01/29/2017 03:08  WBC Latest Ref Range: 4.0 - 10.5 K/uL 5.5  RBC Latest Ref Range: 4.22 - 5.81 MIL/uL 4.14 (L)  Hemoglobin Latest Ref Range: 13.0 - 17.0 g/dL 13.0  HCT Latest Ref Range: 39.0 - 52.0 % 36.9 (L)  MCV Latest Ref Range: 78.0 - 100.0 fL 89.1  MCH Latest Ref Range: 26.0 - 34.0 pg 31.4  MCHC Latest Ref Range: 30.0 - 36.0 g/dL 35.2  RDW Latest Ref Range: 11.5 - 15.5 % 13.7  Platelets Latest Ref Range: 150 - 400 K/uL 151    Treatments: cardiac meds: As above Procedures: 1. Ultrasound guided radial artery access 2. Selective left and right coronary angiography 3. Left heart catheterization 4. Successful percutaneous coronary intervention  Direct stenting Resolute 2.25 x 12 mm PDA PTCA and drug-eluting stent stent placement Resolute 4.0 x 30 mm proximal RCA 5. Conscious sedation monitoring 111 min  Discharge Exam: Blood pressure (!) 146/78, pulse (!) 54, temperature 97.7 F (36.5 C), temperature source Oral, resp. rate 10, height 6\' 5"  (1.956 m), weight 107.8 kg (237 lb 10.5 oz), SpO2 98 %. General Mental Status-Alert. General Appearance-Cooperative and Appears stated age. Build & Nutrition-Well built and Morbidly obese(well-built, 6\' 5"  tall).  Head and  Neck Thyroid Gland Characteristics - normal size and consistency and no palpable nodules.  Chest and Lung Exam Chest and lung exam reveals -quiet, even and easy respiratory effort with no use of accessory muscles, non-tender and on auscultation, normal breath sounds, no adventitious sounds.  Cardiovascular Cardiovascular examination reveals -normal heart sounds, regular rate  and rhythm with no murmurs, carotid auscultation reveals no bruits, abdominal aorta auscultation reveals no bruits and no prominent pulsation and femoral artery auscultation bilaterally reveals normal pulses, no bruits, no thrills. Right radial site with no bleeding, hematoma. Good capillary refill.   Abdomen Palpation/Percussion Palpation and Percussion of the abdomen reveal - Non Tender and No hepatosplenomegaly.  Peripheral Vascular Lower Extremity Inspection - Bilateral - Inspection Normal. Palpation - Dorsalis pedis pulse - Bilateral - 2+. Posterior tibial pulse - Bilateral - 2+. Carotid arteries - Bilateral-No Carotid bruit.  Neurologic Neurologic evaluation reveals -alert and oriented x 3 with no impairment of recent or remote memory. Motor-Grossly intact without any focal deficits.  Musculoskeletal Global Assessment Left Lower Extremity - no deformities, masses or tenderness, no known fractures. Right Lower Extremity - no deformities, masses or tenderness, no known fractures.    Disposition:   Discharge Instructions    AMB Referral to Cardiac Rehabilitation - Phase II   Complete by:  As directed    Diagnosis:  Coronary Stents   Amb Referral to Cardiac Rehabilitation   Complete by:  As directed    Diagnosis:  Coronary Stents   Diet - low sodium heart healthy   Complete by:  As directed    Increase activity slowly   Complete by:  As directed      Allergies as of 01/29/2017   No Known Allergies     Medication List    TAKE these medications   amLODipine 10 MG tablet Commonly known as:  NORVASC Take 1 tablet (10 mg total) daily by mouth.   aspirin 81 MG chewable tablet Chew 1 tablet (81 mg total) daily by mouth.   clopidogrel 75 MG tablet Commonly known as:  PLAVIX Take 1 tablet (75 mg total) daily with breakfast by mouth.   lisinopril 20 MG tablet Commonly known as:  PRINIVIL,ZESTRIL Take 20 mg daily by mouth.   metFORMIN 1000 MG  tablet Commonly known as:  GLUCOPHAGE Take 1,000 mg 2 (two) times daily by mouth.   metoprolol succinate 25 MG 24 hr tablet Commonly known as:  TOPROL-XL Take 1 tablet (25 mg total) daily by mouth.   SODIUM BICARBONATE PO Take 1 tablet daily as needed by mouth (acid reflux).      Follow-up Information    Nigel Mormon, MD Follow up on 02/05/2017.   Specialty:  Cardiology Why:  4:00 PM Contact information: [address] [place] [address] [phone]           Signed: Nigel Mormon 01/29/2017, 8:27 AM   Nigel Mormon, MD [facility] Pager: [phone] Office: [phone] If no answer Cell [phone]

## 2017-01-29 NOTE — Care Management Note (Signed)
Case Management Note  Patient Details  Name: [name] MRN: [id number] Date of Birth: 15-May-1945  Subjective/Objective:    From home, presents with abn stress test, s/p coronary stent intervention, will be on plavix.                 Action/Plan: NCM will follow for dc needs.  Expected Discharge Date:  01/29/17               Expected Discharge Plan:  Home/Self Care  In-House Referral:     Discharge planning Services  CM Consult  Post Acute Care Choice:    Choice offered to:     DME Arranged:    DME Agency:     HH Arranged:    HH Agency:     Status of Service:  Completed, signed off  If discussed at [employer] of Stay Meetings, dates discussed:    Additional Comments:  Zenon Mayo, RN 01/29/2017, 9:17 AM

## 2017-01-29 NOTE — Progress Notes (Signed)
CARDIAC REHAB PHASE I   PRE:  Rate/Rhythm: 68 SR  BP:  Sitting: 131/80        SaO2: 98 RA  MODE:  Ambulation: 1000 ft   POST:  Rate/Rhythm: 93 SR  BP:  Sitting: 130/65         SaO2: 100 RA  Pt ambulated 1000 ft on RA, independent, steady gait, tolerated well. Pt c/o mild "lightheadedness," denies any other complaints, declined rest stop. BP better controlled. Pt states he has no questions regarding education at this time. Pt to edge of bed per pt request after walk, call bell within reach.   [place], RN, BSN 01/29/2017 8:42 AM

## 2017-01-31 ENCOUNTER — Telehealth (HOSPITAL_COMMUNITY): Payer: Self-pay

## 2017-01-31 NOTE — Telephone Encounter (Signed)
Patients insurance is active and benefits verified through Medicare Part A & B - No co-pay, deductible amount of $183.00/$183.00 has been met, 20% co-insurance, and no pre-authorization is required. Passport/reference [phone]  Patients insurance is active and benefits verified through [facility] - No co-pay, no deductible, no out of pocket, no co-insurance, and no pre-authorization is required. Passport/reference [phone]

## 2017-02-04 DIAGNOSIS — Z794 Long term (current) use of insulin: Secondary | ICD-10-CM | POA: Diagnosis not present

## 2017-02-04 DIAGNOSIS — E119 Type 2 diabetes mellitus without complications: Secondary | ICD-10-CM | POA: Diagnosis not present

## 2017-02-04 DIAGNOSIS — I10 Essential (primary) hypertension: Secondary | ICD-10-CM | POA: Diagnosis not present

## 2017-02-04 DIAGNOSIS — E782 Mixed hyperlipidemia: Secondary | ICD-10-CM | POA: Diagnosis not present

## 2017-02-05 ENCOUNTER — Telehealth (HOSPITAL_COMMUNITY): Payer: Self-pay

## 2017-02-05 NOTE — Telephone Encounter (Signed)
Called and spoke with patient in regards to Cardiac Rehab - Patient is interested. Scheduled orientation on 03/05/2017 at 1:30pm. Patient will attend the 2:45pm exc class.

## 2017-02-06 DIAGNOSIS — G5761 Lesion of plantar nerve, right lower limb: Secondary | ICD-10-CM | POA: Diagnosis not present

## 2017-02-18 DIAGNOSIS — Z0000 Encounter for general adult medical examination without abnormal findings: Secondary | ICD-10-CM | POA: Diagnosis not present

## 2017-02-18 DIAGNOSIS — R972 Elevated prostate specific antigen [PSA]: Secondary | ICD-10-CM | POA: Diagnosis not present

## 2017-02-18 DIAGNOSIS — M4692 Unspecified inflammatory spondylopathy, cervical region: Secondary | ICD-10-CM | POA: Diagnosis not present

## 2017-02-18 DIAGNOSIS — E785 Hyperlipidemia, unspecified: Secondary | ICD-10-CM | POA: Diagnosis not present

## 2017-02-18 DIAGNOSIS — N401 Enlarged prostate with lower urinary tract symptoms: Secondary | ICD-10-CM | POA: Diagnosis not present

## 2017-02-18 DIAGNOSIS — Z7984 Long term (current) use of oral hypoglycemic drugs: Secondary | ICD-10-CM | POA: Diagnosis not present

## 2017-02-18 DIAGNOSIS — R809 Proteinuria, unspecified: Secondary | ICD-10-CM | POA: Diagnosis not present

## 2017-02-18 DIAGNOSIS — I10 Essential (primary) hypertension: Secondary | ICD-10-CM | POA: Diagnosis not present

## 2017-02-18 DIAGNOSIS — E118 Type 2 diabetes mellitus with unspecified complications: Secondary | ICD-10-CM | POA: Diagnosis not present

## 2017-02-27 DIAGNOSIS — L309 Dermatitis, unspecified: Secondary | ICD-10-CM | POA: Diagnosis not present

## 2017-02-28 ENCOUNTER — Telehealth (HOSPITAL_COMMUNITY): Payer: Self-pay | Admitting: Pharmacist

## 2017-03-01 NOTE — Telephone Encounter (Signed)
Cardiac Rehab Medication Review by a Pharmacist  Does the patient feel that his/her medications are working for him/her?  yes  Has the patient been experiencing any side effects to the medications prescribed?  yes; muscle cramps, low energy, weakness, needs more sleep than usual   Does the patient measure his/her own blood pressure or blood glucose at home?  no   Does the patient have any problems obtaining medications due to transportation or finances?   no  Understanding of regimen: fair Understanding of indications: fair Potential of compliance: fair  Pharmacist comments: Patient presents for cardiac rehab orientation. He collected his bottles in order to confirm or deny each medication when prompted. Of note, he is not taking lisinopril and says he only took this medication for 1 or 2 weeks. However, he did bring all of the medications he was taking to his doctor and was told that he is on everything he needs to be - unsure if discontinuation of lisinopril was intentional or an oversight. Patient reports having several side effects. He is most concerned with his low energy level and thought this would be improved by now. He wants to be able to come off of many of these medications in the future.  Mila Merry Gerarda Fraction, PharmD PGY1 Pharmacy Resident Pager: [phone] 03/01/2017 2:48 PM

## 2017-03-05 ENCOUNTER — Encounter (HOSPITAL_COMMUNITY): Payer: Self-pay

## 2017-03-05 ENCOUNTER — Encounter (HOSPITAL_COMMUNITY)
Admission: RE | Admit: 2017-03-05 | Discharge: 2017-03-05 | Disposition: A | Discharge status: Home / Self Care | Payer: Medicare Other | Source: Ambulatory Visit | Attending: Cardiology | Admitting: Cardiology

## 2017-03-05 VITALS — BP 142/74 | HR 78 | Ht 77.0 in | Wt 242.9 lb

## 2017-03-05 DIAGNOSIS — Z955 Presence of coronary angioplasty implant and graft: Secondary | ICD-10-CM | POA: Insufficient documentation

## 2017-03-05 NOTE — Progress Notes (Signed)
Cardiac Individual Treatment Plan  Patient Details  Name: [name] MRN: [id number] Date of Birth: [birth year] Referring Provider:     CARDIAC REHAB PHASE II ORIENTATION from 03/05/2017 in [facility]  Referring Provider  Patwardhan, Reynold Bowen, MD      Initial Encounter Date:    CARDIAC REHAB PHASE II ORIENTATION from 03/05/2017 in [facility]  Date  03/05/17  Referring Provider  Nigel Mormon, MD      Visit Diagnosis: Status post coronary artery stent placement 01/28/17 DES RCA, PDA  Patient's Home Medications on Admission:  Current Outpatient Medications:  .  amLODipine (NORVASC) 10 MG tablet, Take 1 tablet (10 mg total) daily by mouth., Disp: 30 tablet, Rfl: 3 .  aspirin 81 MG chewable tablet, Chew 1 tablet (81 mg total) daily by mouth., Disp: 90 tablet, Rfl: 3 .  clopidogrel (PLAVIX) 75 MG tablet, Take 1 tablet (75 mg total) daily with breakfast by mouth., Disp: 60 tablet, Rfl: 3 .  lisinopril (PRINIVIL,ZESTRIL) 20 MG tablet, Take 20 mg daily by mouth., Disp: , Rfl: 1 .  metFORMIN (GLUCOPHAGE) 1000 MG tablet, Take 1,000 mg 2 (two) times daily by mouth. , Disp: , Rfl: 0 .  metoprolol succinate (TOPROL-XL) 25 MG 24 hr tablet, Take 1 tablet (25 mg total) daily by mouth., Disp: 30 tablet, Rfl: 3  Past Medical History: Past Medical History:  Diagnosis Date  . Arthritis   . Coronary artery disease   . Diabetes mellitus without complication ([place])    type2  . History of kidney stones   . HLD (hyperlipidemia)   . Hypertension     Tobacco Use: Social History   Tobacco Use  Smoking Status Never Smoker  Smokeless Tobacco Never Used    Labs: Recent Review Scientist, physiological    Labs for ITP Cardiac and Pulmonary Rehab Latest Ref Rng & Units 08/26/2009 08/26/2009 08/26/2009   Hemoglobin A1c <5.7 % - - 6.1 (NOTE)                                                                       According to the ADA Clinical  Practice Recommendations for 2011, when HbA1c is used as a screening test:   >=6.5%   Diagnostic of Diabetes Mellitus           (if abnormal result is confirmed)  5.7-6.4%   Increased risk of developing Diabetes Mellitus  References:Diagnosis and Classification of Diabetes Mellitus,Diabetes KWIO,9735,32(DJMEQ 1):S62-S69 and Standards of Medical Care in         Diabetes - 2011,Diabetes Care,2011,34 (Suppl 1):S11-S61.(H)   TCO2 0 - 100 mmol/L 21 20 -      Capillary Blood Glucose: Lab Results  Component Value Date   GLUCAP 157 (H) 01/29/2017   GLUCAP 205 (H) 01/28/2017   GLUCAP 201 (H) 01/28/2017   GLUCAP 162 (H) 01/28/2017   GLUCAP 155 (H) 01/28/2017     Exercise Target Goals: Date: 03/05/17  Exercise Program Goal: Individual exercise prescription set with THRR, safety & activity barriers. Participant demonstrates ability to understand and report RPE using BORG scale, to self-measure pulse accurately, and to acknowledge the importance of the exercise prescription.  Exercise Prescription Goal: Starting with  aerobic activity 30 plus minutes a day, 3 days per week for initial exercise prescription. Provide home exercise prescription and guidelines that participant acknowledges understanding prior to discharge.  Activity Barriers & Risk Stratification: Activity Barriers & Cardiac Risk Stratification - 03/05/17 1602      Activity Barriers & Cardiac Risk Stratification   Activity Barriers  Arthritis;History of Falls;Back Problems;Neck/Spine Problems    Cardiac Risk Stratification  Moderate       6 Minute Walk: 6 Minute Walk    Row Name 03/05/17 1556         6 Minute Walk   Phase  Initial     Distance  2000 feet     Walk Time  6 minutes     # of Rest Breaks  0     MPH  3.79     METS  4.42     RPE  12     VO2 Peak  15.49     Symptoms  No     Resting HR  78 bpm     Resting BP  142/74     Resting Oxygen Saturation   100 %     Exercise Oxygen Saturation  during 6 min walk   100 %     Max Ex. HR  112 bpm     Max Ex. BP  142/82     2 Minute Post BP  138/64        Oxygen Initial Assessment:   Oxygen Re-Evaluation:   Oxygen Discharge (Final Oxygen Re-Evaluation):   Initial Exercise Prescription: Initial Exercise Prescription - 03/05/17 1500      Date of Initial Exercise RX and Referring Provider   Date  03/05/17    Referring Provider  Patwardhan, Reynold Bowen, MD      Recumbant Bike   Level  2    Watts  35    Minutes  3    METs  3      Track   Laps  13    Minutes  10    METs  3.26      Prescription Details   Frequency (times per week)  3    Duration  Progress to 45 minutes of aerobic exercise without signs/symptoms of physical distress      Intensity   THRR 40-80% of Max Heartrate  60-121    Ratings of Perceived Exertion  11-13    Perceived Dyspnea  0-4      Progression   Progression  Continue to progress workloads to maintain intensity without signs/symptoms of physical distress.      Resistance Training   Training Prescription  Yes    Weight  4lbs.    Reps  10-15       Perform Capillary Blood Glucose checks as needed.  Exercise Prescription Changes:   Exercise Comments:   Exercise Goals and Review:  Exercise Goals    Row Name 03/05/17 1601             Exercise Goals   Increase Physical Activity  Yes       Intervention  Provide advice, education, support and counseling about physical activity/exercise needs.;Develop an individualized exercise prescription for aerobic and resistive training based on initial evaluation findings, risk stratification, comorbidities and participant's personal goals.       Expected Outcomes  Achievement of increased cardiorespiratory fitness and enhanced flexibility, muscular endurance and strength shown through measurements of functional capacity and personal statement of participant.  Increase Strength and Stamina  Yes       Intervention  Provide advice, education, support and  counseling about physical activity/exercise needs.;Develop an individualized exercise prescription for aerobic and resistive training based on initial evaluation findings, risk stratification, comorbidities and participant's personal goals.       Expected Outcomes  Achievement of increased cardiorespiratory fitness and enhanced flexibility, muscular endurance and strength shown through measurements of functional capacity and personal statement of participant.       Able to understand and use rate of perceived exertion (RPE) scale  Yes       Intervention  Provide education and explanation on how to use RPE scale       Expected Outcomes  Short Term: Able to use RPE daily in rehab to express subjective intensity level;Long Term:  Able to use RPE to guide intensity level when exercising independently       Knowledge and understanding of Target Heart Rate Range (THRR)  Yes       Intervention  Provide education and explanation of THRR including how the numbers were predicted and where they are located for reference       Expected Outcomes  Short Term: Able to state/look up THRR;Long Term: Able to use THRR to govern intensity when exercising independently;Short Term: Able to use daily as guideline for intensity in rehab       Able to check pulse independently  Yes       Intervention  Provide education and demonstration on how to check pulse in carotid and radial arteries.;Review the importance of being able to check your own pulse for safety during independent exercise       Expected Outcomes  Short Term: Able to explain why pulse checking is important during independent exercise;Long Term: Able to check pulse independently and accurately       Understanding of Exercise Prescription  Yes       Intervention  Provide education, explanation, and written materials on patient's individual exercise prescription       Expected Outcomes  Short Term: Able to explain program exercise prescription;Long Term: Able to  explain home exercise prescription to exercise independently          Exercise Goals Re-Evaluation :    Discharge Exercise Prescription (Final Exercise Prescription Changes):   Nutrition:  Target Goals: Understanding of nutrition guidelines, daily intake of sodium 1500mg , cholesterol 200mg , calories 30% from fat and 7% or less from saturated fats, daily to have 5 or more servings of fruits and vegetables.  Biometrics: Pre Biometrics - 03/05/17 1602      Pre Biometrics   Height  6\' 5"  (1.956 m)    Weight  242 lb 15.2 oz (110.2 kg)    Waist Circumference  43 inches    Hip Circumference  41.5 inches    Waist to Hip Ratio  1.04 %    BMI (Calculated)  28.8    Triceps Skinfold  6 mm    % Body Fat  24.9 %    Grip Strength  45.5 kg    Flexibility  12 in    Single Leg Stand  12.9 seconds        Nutrition Therapy Plan and Nutrition Goals:   Nutrition Discharge: Nutrition Scores:   Nutrition Goals Re-Evaluation:   Nutrition Goals Re-Evaluation:   Nutrition Goals Discharge (Final Nutrition Goals Re-Evaluation):   Psychosocial: Target Goals: Acknowledge presence or absence of significant depression and/or stress, maximize coping skills, provide positive support system.  Participant is able to verbalize types and ability to use techniques and skills needed for reducing stress and depression.  Initial Review & Psychosocial Screening: Initial Psych Review & Screening - 03/05/17 1405      Initial Review   Current issues with  Current Anxiety/Panic      Family Dynamics   Good Support System?  Yes spouse, friend     Comments  pt with health related anxiety fropm recent cardaic event.  pt concerned about new medications.        Barriers   Psychosocial barriers to participate in program  The patient should benefit from training in stress management and relaxation.      Screening Interventions   Interventions  Encouraged to exercise;Provide feedback about the scores to  participant       Quality of Life Scores: Quality of Life - 03/05/17 1406      Quality of Life Scores   Health/Function Pre  23.13 %    Socioeconomic Pre  22.69 %    Psych/Spiritual Pre  22.4 %    Family Pre  26.4 %    GLOBAL Pre  23.36 %       PHQ-9: Recent Review Flowsheet Data    There is no flowsheet data to display.     Interpretation of Total Score  Total Score Depression Severity:  1-4 = Minimal depression, 5-9 = Mild depression, 10-14 = Moderate depression, 15-19 = Moderately severe depression, 20-27 = Severe depression   Psychosocial Evaluation and Intervention:   Psychosocial Re-Evaluation:   Psychosocial Discharge (Final Psychosocial Re-Evaluation):   Vocational Rehabilitation: Provide vocational rehab assistance to qualifying candidates.   Vocational Rehab Evaluation & Intervention: Vocational Rehab - 03/05/17 1404      Initial Vocational Rehab Evaluation & Intervention   Assessment shows need for Vocational Rehabilitation  No self employed        Education: Education Goals: Education classes will be provided on a weekly basis, covering required topics. Participant will state understanding/return demonstration of topics presented.  Learning Barriers/Preferences: Learning Barriers/Preferences - 03/05/17 1604      Learning Barriers/Preferences   Learning Barriers  Sight    Learning Preferences  Written Material;Video       Education Topics: Count Your Pulse:  -Group instruction provided by verbal instruction, demonstration, patient participation and written materials to support subject.  Instructors address importance of being able to find your pulse and how to count your pulse when at home without a heart monitor.  Patients get hands on experience counting their pulse with staff help and individually.   Heart Attack, Angina, and Risk Factor Modification:  -Group instruction provided by verbal instruction, video, and written materials to  support subject.  Instructors address signs and symptoms of angina and heart attacks.    Also discuss risk factors for heart disease and how to make changes to improve heart health risk factors.   Functional Fitness:  -Group instruction provided by verbal instruction, demonstration, patient participation, and written materials to support subject.  Instructors address safety measures for doing things around the house.  Discuss how to get up and down off the floor, how to pick things up properly, how to safely get out of a chair without assistance, and balance training.   Meditation and Mindfulness:  -Group instruction provided by verbal instruction, patient participation, and written materials to support subject.  Instructor addresses importance of mindfulness and meditation practice to help reduce stress and improve awareness.  Instructor also leads participants through  a meditation exercise.    Stretching for Flexibility and Mobility:  -Group instruction provided by verbal instruction, patient participation, and written materials to support subject.  Instructors lead participants through series of stretches that are designed to increase flexibility thus improving mobility.  These stretches are additional exercise for major muscle groups that are typically performed during regular warm up and cool down.   Hands Only CPR:  -Group verbal, video, and participation provides a basic overview of AHA guidelines for community CPR. Role-play of emergencies allow participants the opportunity to practice calling for help and chest compression technique with discussion of AED use.   Hypertension: -Group verbal and written instruction that provides a basic overview of hypertension including the most recent diagnostic guidelines, risk factor reduction with self-care instructions and medication management.    Nutrition I class: Heart Healthy Eating:  -Group instruction provided by PowerPoint slides, verbal  discussion, and written materials to support subject matter. The instructor gives an explanation and review of the Therapeutic Lifestyle Changes diet recommendations, which includes a discussion on lipid goals, dietary fat, sodium, fiber, plant stanol/sterol esters, sugar, and the components of a well-balanced, healthy diet.   Nutrition II class: Lifestyle Skills:  -Group instruction provided by PowerPoint slides, verbal discussion, and written materials to support subject matter. The instructor gives an explanation and review of label reading, grocery shopping for heart health, heart healthy recipe modifications, and ways to make healthier choices when eating out.   Diabetes Question & Answer:  -Group instruction provided by PowerPoint slides, verbal discussion, and written materials to support subject matter. The instructor gives an explanation and review of diabetes co-morbidities, pre- and post-prandial blood glucose goals, pre-exercise blood glucose goals, signs, symptoms, and treatment of hypoglycemia and hyperglycemia, and foot care basics.   Diabetes Blitz:  -Group instruction provided by PowerPoint slides, verbal discussion, and written materials to support subject matter. The instructor gives an explanation and review of the physiology behind type 1 and type 2 diabetes, diabetes medications and rational behind using different medications, pre- and post-prandial blood glucose recommendations and Hemoglobin A1c goals, diabetes diet, and exercise including blood glucose guidelines for exercising safely.    Portion Distortion:  -Group instruction provided by PowerPoint slides, verbal discussion, written materials, and food models to support subject matter. The instructor gives an explanation of serving size versus portion size, changes in portions sizes over the last 20 years, and what consists of a serving from each food group.   Stress Management:  -Group instruction provided by verbal  instruction, video, and written materials to support subject matter.  Instructors review role of stress in heart disease and how to cope with stress positively.     Exercising on Your Own:  -Group instruction provided by verbal instruction, power point, and written materials to support subject.  Instructors discuss benefits of exercise, components of exercise, frequency and intensity of exercise, and end points for exercise.  Also discuss use of nitroglycerin and activating EMS.  Review options of places to exercise outside of rehab.  Review guidelines for sex with heart disease.   Cardiac Drugs I:  -Group instruction provided by verbal instruction and written materials to support subject.  Instructor reviews cardiac drug classes: antiplatelets, anticoagulants, beta blockers, and statins.  Instructor discusses reasons, side effects, and lifestyle considerations for each drug class.   Cardiac Drugs II:  -Group instruction provided by verbal instruction and written materials to support subject.  Instructor reviews cardiac drug classes: angiotensin converting enzyme inhibitors (ACE-I),  angiotensin II receptor blockers (ARBs), nitrates, and calcium channel blockers.  Instructor discusses reasons, side effects, and lifestyle considerations for each drug class.   Anatomy and Physiology of the Circulatory System:  Group verbal and written instruction and models provide basic cardiac anatomy and physiology, with the coronary electrical and arterial systems. Review of: AMI, Angina, Valve disease, Heart Failure, Peripheral Artery Disease, Cardiac Arrhythmia, Pacemakers, and the ICD.   Other Education:  -Group or individual verbal, written, or video instructions that support the educational goals of the cardiac rehab program.   Knowledge Questionnaire Score: Knowledge Questionnaire Score - 03/05/17 1405      Knowledge Questionnaire Score   Pre Score  18/24       Core Components/Risk  Factors/Patient Goals at Admission: Personal Goals and Risk Factors at Admission - 03/05/17 1554      Core Components/Risk Factors/Patient Goals on Admission   Diabetes  Yes    Intervention  Provide education about signs/symptoms and action to take for hypo/hyperglycemia.;Provide education about proper nutrition, including hydration, and aerobic/resistive exercise prescription along with prescribed medications to achieve blood glucose in normal ranges: Fasting glucose 65-99 mg/dL    Expected Outcomes  Short Term: Participant verbalizes understanding of the signs/symptoms and immediate care of hyper/hypoglycemia, proper foot care and importance of medication, aerobic/resistive exercise and nutrition plan for blood glucose control.;Long Term: Attainment of HbA1C < 7%.    Hypertension  Yes    Intervention  Provide education on lifestyle modifcations including regular physical activity/exercise, weight management, moderate sodium restriction and increased consumption of fresh fruit, vegetables, and low fat dairy, alcohol moderation, and smoking cessation.;Monitor prescription use compliance.    Expected Outcomes  Long Term: Maintenance of blood pressure at goal levels.;Short Term: Continued assessment and intervention until BP is < 140/61mm HG in hypertensive participants. < 130/50mm HG in hypertensive participants with diabetes, heart failure or chronic kidney disease.    Lipids  Yes    Intervention  Provide education and support for participant on nutrition & aerobic/resistive exercise along with prescribed medications to achieve LDL 70mg , HDL >40mg .    Expected Outcomes  Short Term: Participant states understanding of desired cholesterol values and is compliant with medications prescribed. Participant is following exercise prescription and nutrition guidelines.;Long Term: Cholesterol controlled with medications as prescribed, with individualized exercise RX and with personalized nutrition plan. Value  goals: LDL < 70mg , HDL > 40 mg.       Core Components/Risk Factors/Patient Goals Review:    Core Components/Risk Factors/Patient Goals at Discharge (Final Review):    ITP Comments: ITP Comments    Row Name 03/05/17 1343           ITP Comments  Dr. Fransico Him, Medical Director           Comments:Fred attended orientation from 1327 to 1525 to review rules and guidelines for program. Completed 6 minute walk test, Intitial ITP, and exercise prescription.  VSS. Telemetry-Sinus Rhtyhm.  Asymptomatic Avory says he may not participate for the full 3 months. Barnet Pall, RN,BSN 03/05/2017 4:46 PM

## 2017-03-05 NOTE — Progress Notes (Signed)
[name] 71 y.o. male DOB: [birth year] MRN: [id number]      Nutrition Note Dx: DES RCA, PDA  Past Medical History:  Diagnosis Date  . Arthritis   . Coronary artery disease   . Diabetes mellitus without complication ([place])    type2  . History of kidney stones   . HLD (hyperlipidemia)   . Hypertension    Meds reviewed. Metformin noted  HT: Ht Readings from Last 1 Encounters:  01/28/17 6\' 5"  (1.956 m)    WT: Wt Readings from Last 3 Encounters:  01/29/17 237 lb 10.5 oz (107.8 kg)     BMI 28.8  Current tobacco use? No  Labs:  Lipid Panel  No results found for: CHOL, TRIG, HDL, CHOLHDL, VLDL, LDLCALC, LDLDIRECT  CBG (last 3)  No results for input(s): GLUCAP in the last 72 hours.  Nutrition Note Spoke with pt. Nutrition plan and goals reviewed with pt. Pt is not currently following a heart healthy diet. Pt states "I know what to do, I just don't do it."  Pt is diabetic. No recent A1c noted. Pt reports his last A1c was " 5 or 6." Pt does not check his CBG's "because I can tell my the smell of my urine or how I feel." Pt is not interested in checking his CBG's at this time despite being educated re: his approach versus recommended approach to glycemic management.  Pt expressed understanding of the information reviewed. Pt aware of nutrition education classes offered.  Nutrition Diagnosis ? Food-and nutrition-related knowledge deficit related to lack of exposure to information as related to diagnosis of: ? CVD ? DM ? Overweight related to excessive energy intake as evidenced by a BMI of 28.8  Nutrition Intervention ? Pt's individual nutrition plan and goals reviewed with pt. ? Pt given handouts for: ? Nutrition I class ? Nutrition II class  ? Diabetes Blitz Class  Nutrition Goal(s):  ? Pt to identify and limit food sources of saturated fat, trans fat, and sodium ? CBG concentrations in the normal range or as close to normal as is safely possible.  Plan:  Pt to attend  nutrition classes ? Nutrition I ? Nutrition II ? Portion Distortion ? Diabetes Blitz ? Diabetes Q & A Will provide client-centered nutrition education as part of interdisciplinary care.   Monitor and evaluate progress toward nutrition goal with team.  Derek Mound, M.Ed, RD, LDN, CDE 03/05/2017 3:29 PM

## 2017-03-06 DIAGNOSIS — L309 Dermatitis, unspecified: Secondary | ICD-10-CM | POA: Diagnosis not present

## 2017-03-13 ENCOUNTER — Encounter (HOSPITAL_COMMUNITY): Payer: Medicare Other

## 2017-03-13 DIAGNOSIS — E119 Type 2 diabetes mellitus without complications: Secondary | ICD-10-CM | POA: Diagnosis not present

## 2017-03-15 ENCOUNTER — Encounter (HOSPITAL_COMMUNITY)
Admission: RE | Admit: 2017-03-15 | Discharge: 2017-03-15 | Disposition: A | Discharge status: Home / Self Care | Payer: Medicare Other | Source: Ambulatory Visit | Attending: Cardiology | Admitting: Cardiology

## 2017-03-15 DIAGNOSIS — Z955 Presence of coronary angioplasty implant and graft: Secondary | ICD-10-CM

## 2017-03-15 LAB — GLUCOSE, CAPILLARY
GLUCOSE-CAPILLARY: 189 mg/dL — AB (ref 65–99)
GLUCOSE-CAPILLARY: 124 mg/dL — AB (ref 65–99)

## 2017-03-15 NOTE — Progress Notes (Signed)
Cardiac Individual Treatment Plan  Patient Details  Name: [name] MRN: [id number] Date of Birth: Jul 21, 1945 Referring Provider:     CARDIAC REHAB PHASE II ORIENTATION from 03/05/2017 in [facility]  Referring Provider  Patwardhan, Reynold Bowen, MD      Initial Encounter Date:    CARDIAC REHAB PHASE II ORIENTATION from 03/05/2017 in [facility]  Date  03/05/17  Referring Provider  Nigel Mormon, MD      Visit Diagnosis: Status post coronary artery stent placement 01/28/17 DES RCA, PDA  Patient's Home Medications on Admission:  Current Outpatient Medications:  .  amLODipine (NORVASC) 10 MG tablet, Take 1 tablet (10 mg total) daily by mouth., Disp: 30 tablet, Rfl: 3 .  aspirin 81 MG chewable tablet, Chew 1 tablet (81 mg total) daily by mouth., Disp: 90 tablet, Rfl: 3 .  clopidogrel (PLAVIX) 75 MG tablet, Take 1 tablet (75 mg total) daily with breakfast by mouth., Disp: 60 tablet, Rfl: 3 .  lisinopril (PRINIVIL,ZESTRIL) 20 MG tablet, Take 20 mg daily by mouth., Disp: , Rfl: 1 .  metFORMIN (GLUCOPHAGE) 1000 MG tablet, Take 1,000 mg 2 (two) times daily by mouth. , Disp: , Rfl: 0 .  metoprolol succinate (TOPROL-XL) 25 MG 24 hr tablet, Take 1 tablet (25 mg total) daily by mouth., Disp: 30 tablet, Rfl: 3  Past Medical History: Past Medical History:  Diagnosis Date  . Arthritis   . Coronary artery disease   . Diabetes mellitus without complication ([place])    type2  . History of kidney stones   . HLD (hyperlipidemia)   . Hypertension     Tobacco Use: Social History   Tobacco Use  Smoking Status Never Smoker  Smokeless Tobacco Never Used    Labs: Recent Review Scientist, physiological    Labs for ITP Cardiac and Pulmonary Rehab Latest Ref Rng & Units 08/26/2009 08/26/2009 08/26/2009   Hemoglobin A1c <5.7 % - - 6.1 (NOTE)                                                                       According to the ADA Clinical  Practice Recommendations for 2011, when HbA1c is used as a screening test:   >=6.5%   Diagnostic of Diabetes Mellitus           (if abnormal result is confirmed)  5.7-6.4%   Increased risk of developing Diabetes Mellitus  References:Diagnosis and Classification of Diabetes Mellitus,Diabetes TKWI,0973,53(GDJME 1):S62-S69 and Standards of Medical Care in         Diabetes - 2011,Diabetes Care,2011,34 (Suppl 1):S11-S61.(H)   TCO2 0 - 100 mmol/L 21 20 -      Capillary Blood Glucose: Lab Results  Component Value Date   GLUCAP 157 (H) 01/29/2017   GLUCAP 205 (H) 01/28/2017   GLUCAP 201 (H) 01/28/2017   GLUCAP 162 (H) 01/28/2017   GLUCAP 155 (H) 01/28/2017     Exercise Target Goals:    Exercise Program Goal: Individual exercise prescription set with THRR, safety & activity barriers. Participant demonstrates ability to understand and report RPE using BORG scale, to self-measure pulse accurately, and to acknowledge the importance of the exercise prescription.  Exercise Prescription Goal: Starting with  aerobic activity 30 plus minutes a day, 3 days per week for initial exercise prescription. Provide home exercise prescription and guidelines that participant acknowledges understanding prior to discharge.  Activity Barriers & Risk Stratification: Activity Barriers & Cardiac Risk Stratification - 03/05/17 1602      Activity Barriers & Cardiac Risk Stratification   Activity Barriers  Arthritis;History of Falls;Back Problems;Neck/Spine Problems    Cardiac Risk Stratification  Moderate       6 Minute Walk: 6 Minute Walk    Row Name 03/05/17 1556         6 Minute Walk   Phase  Initial     Distance  2000 feet     Walk Time  6 minutes     # of Rest Breaks  0     MPH  3.79     METS  4.42     RPE  12     VO2 Peak  15.49     Symptoms  No     Resting HR  78 bpm     Resting BP  142/74     Resting Oxygen Saturation   100 %     Exercise Oxygen Saturation  during 6 min walk  100 %     Max  Ex. HR  112 bpm     Max Ex. BP  142/82     2 Minute Post BP  138/64        Oxygen Initial Assessment:   Oxygen Re-Evaluation:   Oxygen Discharge (Final Oxygen Re-Evaluation):   Initial Exercise Prescription: Initial Exercise Prescription - 03/05/17 1500      Date of Initial Exercise RX and Referring Provider   Date  03/05/17    Referring Provider  Patwardhan, Reynold Bowen, MD      Recumbant Bike   Level  2    Watts  35    Minutes  3    METs  3      Track   Laps  13    Minutes  10    METs  3.26      Prescription Details   Frequency (times per week)  3    Duration  Progress to 45 minutes of aerobic exercise without signs/symptoms of physical distress      Intensity   THRR 40-80% of Max Heartrate  60-121    Ratings of Perceived Exertion  11-13    Perceived Dyspnea  0-4      Progression   Progression  Continue to progress workloads to maintain intensity without signs/symptoms of physical distress.      Resistance Training   Training Prescription  Yes    Weight  4lbs.    Reps  10-15       Perform Capillary Blood Glucose checks as needed.  Exercise Prescription Changes:   Exercise Comments:   Exercise Goals and Review:  Exercise Goals    Row Name 03/05/17 1601             Exercise Goals   Increase Physical Activity  Yes       Intervention  Provide advice, education, support and counseling about physical activity/exercise needs.;Develop an individualized exercise prescription for aerobic and resistive training based on initial evaluation findings, risk stratification, comorbidities and participant's personal goals.       Expected Outcomes  Achievement of increased cardiorespiratory fitness and enhanced flexibility, muscular endurance and strength shown through measurements of functional capacity and personal statement of participant.  Increase Strength and Stamina  Yes       Intervention  Provide advice, education, support and counseling about  physical activity/exercise needs.;Develop an individualized exercise prescription for aerobic and resistive training based on initial evaluation findings, risk stratification, comorbidities and participant's personal goals.       Expected Outcomes  Achievement of increased cardiorespiratory fitness and enhanced flexibility, muscular endurance and strength shown through measurements of functional capacity and personal statement of participant.       Able to understand and use rate of perceived exertion (RPE) scale  Yes       Intervention  Provide education and explanation on how to use RPE scale       Expected Outcomes  Short Term: Able to use RPE daily in rehab to express subjective intensity level;Long Term:  Able to use RPE to guide intensity level when exercising independently       Knowledge and understanding of Target Heart Rate Range (THRR)  Yes       Intervention  Provide education and explanation of THRR including how the numbers were predicted and where they are located for reference       Expected Outcomes  Short Term: Able to state/look up THRR;Long Term: Able to use THRR to govern intensity when exercising independently;Short Term: Able to use daily as guideline for intensity in rehab       Able to check pulse independently  Yes       Intervention  Provide education and demonstration on how to check pulse in carotid and radial arteries.;Review the importance of being able to check your own pulse for safety during independent exercise       Expected Outcomes  Short Term: Able to explain why pulse checking is important during independent exercise;Long Term: Able to check pulse independently and accurately       Understanding of Exercise Prescription  Yes       Intervention  Provide education, explanation, and written materials on patient's individual exercise prescription       Expected Outcomes  Short Term: Able to explain program exercise prescription;Long Term: Able to explain home exercise  prescription to exercise independently          Exercise Goals Re-Evaluation :    Discharge Exercise Prescription (Final Exercise Prescription Changes):   Nutrition:  Target Goals: Understanding of nutrition guidelines, daily intake of sodium 1500mg , cholesterol 200mg , calories 30% from fat and 7% or less from saturated fats, daily to have 5 or more servings of fruits and vegetables.  Biometrics: Pre Biometrics - 03/05/17 1602      Pre Biometrics   Height  6\' 5"  (1.956 m)    Weight  242 lb 15.2 oz (110.2 kg)    Waist Circumference  43 inches    Hip Circumference  41.5 inches    Waist to Hip Ratio  1.04 %    BMI (Calculated)  28.8    Triceps Skinfold  6 mm    % Body Fat  24.9 %    Grip Strength  45.5 kg    Flexibility  12 in    Single Leg Stand  12.9 seconds        Nutrition Therapy Plan and Nutrition Goals: Nutrition Therapy & Goals - 03/06/17 1405      Nutrition Therapy   Diet  Carb Modified, Heart Healthy      Personal Nutrition Goals   Nutrition Goal  Pt to identify and limit food sources of saturated fat, trans  fat, and sodium    Personal Goal #2  CBG concentrations in the normal range or as close to normal as is safely possible.      Intervention Plan   Intervention  Prescribe, educate and counsel regarding individualized specific dietary modifications aiming towards targeted core components such as weight, hypertension, lipid management, diabetes, heart failure and other comorbidities.    Expected Outcomes  Short Term Goal: Understand basic principles of dietary content, such as calories, fat, sodium, cholesterol and nutrients.;Long Term Goal: Adherence to prescribed nutrition plan.       Nutrition Discharge: Nutrition Scores: Nutrition Assessments - 03/06/17 1405      MEDFICTS Scores   Pre Score  78       Nutrition Goals Re-Evaluation:   Nutrition Goals Re-Evaluation:   Nutrition Goals Discharge (Final Nutrition Goals  Re-Evaluation):   Psychosocial: Target Goals: Acknowledge presence or absence of significant depression and/or stress, maximize coping skills, provide positive support system. Participant is able to verbalize types and ability to use techniques and skills needed for reducing stress and depression.  Initial Review & Psychosocial Screening: Initial Psych Review & Screening - 03/05/17 1405      Initial Review   Current issues with  Current Anxiety/Panic      Family Dynamics   Good Support System?  Yes spouse, friend     Comments  pt with health related anxiety fropm recent cardaic event.  pt concerned about new medications.        Barriers   Psychosocial barriers to participate in program  The patient should benefit from training in stress management and relaxation.      Screening Interventions   Interventions  Encouraged to exercise;Provide feedback about the scores to participant       Quality of Life Scores: Quality of Life - 03/05/17 1406      Quality of Life Scores   Health/Function Pre  23.13 %    Socioeconomic Pre  22.69 %    Psych/Spiritual Pre  22.4 %    Family Pre  26.4 %    GLOBAL Pre  23.36 %       PHQ-9: Recent Review Flowsheet Data    Depression screen [facility] 2/9 03/15/2017   Decreased Interest 0   Down, Depressed, Hopeless 0   PHQ - 2 Score 0     Interpretation of Total Score  Total Score Depression Severity:  1-4 = Minimal depression, 5-9 = Mild depression, 10-14 = Moderate depression, 15-19 = Moderately severe depression, 20-27 = Severe depression   Psychosocial Evaluation and Intervention:   Psychosocial Re-Evaluation:   Psychosocial Discharge (Final Psychosocial Re-Evaluation):   Vocational Rehabilitation: Provide vocational rehab assistance to qualifying candidates.   Vocational Rehab Evaluation & Intervention: Vocational Rehab - 03/05/17 1404      Initial Vocational Rehab Evaluation & Intervention   Assessment shows need for Vocational  Rehabilitation  No self employed        Education: Education Goals: Education classes will be provided on a weekly basis, covering required topics. Participant will state understanding/return demonstration of topics presented.  Learning Barriers/Preferences: Learning Barriers/Preferences - 03/05/17 1604      Learning Barriers/Preferences   Learning Barriers  Sight    Learning Preferences  Written Material;Video       Education Topics: Count Your Pulse:  -Group instruction provided by verbal instruction, demonstration, patient participation and written materials to support subject.  Instructors address importance of being able to find your pulse and how to count your  pulse when at home without a heart monitor.  Patients get hands on experience counting their pulse with staff help and individually.   Heart Attack, Angina, and Risk Factor Modification:  -Group instruction provided by verbal instruction, video, and written materials to support subject.  Instructors address signs and symptoms of angina and heart attacks.    Also discuss risk factors for heart disease and how to make changes to improve heart health risk factors.   Functional Fitness:  -Group instruction provided by verbal instruction, demonstration, patient participation, and written materials to support subject.  Instructors address safety measures for doing things around the house.  Discuss how to get up and down off the floor, how to pick things up properly, how to safely get out of a chair without assistance, and balance training.   Meditation and Mindfulness:  -Group instruction provided by verbal instruction, patient participation, and written materials to support subject.  Instructor addresses importance of mindfulness and meditation practice to help reduce stress and improve awareness.  Instructor also leads participants through a meditation exercise.    Stretching for Flexibility and Mobility:  -Group instruction  provided by verbal instruction, patient participation, and written materials to support subject.  Instructors lead participants through series of stretches that are designed to increase flexibility thus improving mobility.  These stretches are additional exercise for major muscle groups that are typically performed during regular warm up and cool down.   Hands Only CPR:  -Group verbal, video, and participation provides a basic overview of AHA guidelines for community CPR. Role-play of emergencies allow participants the opportunity to practice calling for help and chest compression technique with discussion of AED use.   Hypertension: -Group verbal and written instruction that provides a basic overview of hypertension including the most recent diagnostic guidelines, risk factor reduction with self-care instructions and medication management.    Nutrition I class: Heart Healthy Eating:  -Group instruction provided by PowerPoint slides, verbal discussion, and written materials to support subject matter. The instructor gives an explanation and review of the Therapeutic Lifestyle Changes diet recommendations, which includes a discussion on lipid goals, dietary fat, sodium, fiber, plant stanol/sterol esters, sugar, and the components of a well-balanced, healthy diet.   CARDIAC REHAB PHASE II ORIENTATION from 03/05/2017 in [facility]  Date  03/05/17  Educator  RD  Instruction Review Code  Not applicable      Nutrition II class: Lifestyle Skills:  -Group instruction provided by PowerPoint slides, verbal discussion, and written materials to support subject matter. The instructor gives an explanation and review of label reading, grocery shopping for heart health, heart healthy recipe modifications, and ways to make healthier choices when eating out.   CARDIAC REHAB PHASE II ORIENTATION from 03/05/2017 in [facility]  Date  03/05/17   Educator  RD  Instruction Review Code  Not applicable      Diabetes Question & Answer:  -Group instruction provided by PowerPoint slides, verbal discussion, and written materials to support subject matter. The instructor gives an explanation and review of diabetes co-morbidities, pre- and post-prandial blood glucose goals, pre-exercise blood glucose goals, signs, symptoms, and treatment of hypoglycemia and hyperglycemia, and foot care basics.   Diabetes Blitz:  -Group instruction provided by PowerPoint slides, verbal discussion, and written materials to support subject matter. The instructor gives an explanation and review of the physiology behind type 1 and type 2 diabetes, diabetes medications and rational behind using different medications, pre- and  post-prandial blood glucose recommendations and Hemoglobin A1c goals, diabetes diet, and exercise including blood glucose guidelines for exercising safely.    CARDIAC REHAB PHASE II ORIENTATION from 03/05/2017 in [facility]  Date  03/05/17  Educator  RD  Instruction Review Code  Not applicable      Portion Distortion:  -Group instruction provided by PowerPoint slides, verbal discussion, written materials, and food models to support subject matter. The instructor gives an explanation of serving size versus portion size, changes in portions sizes over the last 20 years, and what consists of a serving from each food group.   Stress Management:  -Group instruction provided by verbal instruction, video, and written materials to support subject matter.  Instructors review role of stress in heart disease and how to cope with stress positively.     Exercising on Your Own:  -Group instruction provided by verbal instruction, power point, and written materials to support subject.  Instructors discuss benefits of exercise, components of exercise, frequency and intensity of exercise, and end points for exercise.  Also  discuss use of nitroglycerin and activating EMS.  Review options of places to exercise outside of rehab.  Review guidelines for sex with heart disease.   Cardiac Drugs I:  -Group instruction provided by verbal instruction and written materials to support subject.  Instructor reviews cardiac drug classes: antiplatelets, anticoagulants, beta blockers, and statins.  Instructor discusses reasons, side effects, and lifestyle considerations for each drug class.   Cardiac Drugs II:  -Group instruction provided by verbal instruction and written materials to support subject.  Instructor reviews cardiac drug classes: angiotensin converting enzyme inhibitors (ACE-I), angiotensin II receptor blockers (ARBs), nitrates, and calcium channel blockers.  Instructor discusses reasons, side effects, and lifestyle considerations for each drug class.   Anatomy and Physiology of the Circulatory System:  Group verbal and written instruction and models provide basic cardiac anatomy and physiology, with the coronary electrical and arterial systems. Review of: AMI, Angina, Valve disease, Heart Failure, Peripheral Artery Disease, Cardiac Arrhythmia, Pacemakers, and the ICD.   Other Education:  -Group or individual verbal, written, or video instructions that support the educational goals of the cardiac rehab program.   Knowledge Questionnaire Score: Knowledge Questionnaire Score - 03/05/17 1405      Knowledge Questionnaire Score   Pre Score  18/24       Core Components/Risk Factors/Patient Goals at Admission: Personal Goals and Risk Factors at Admission - 03/05/17 1554      Core Components/Risk Factors/Patient Goals on Admission   Diabetes  Yes    Intervention  Provide education about signs/symptoms and action to take for hypo/hyperglycemia.;Provide education about proper nutrition, including hydration, and aerobic/resistive exercise prescription along with prescribed medications to achieve blood glucose in  normal ranges: Fasting glucose 65-99 mg/dL    Expected Outcomes  Short Term: Participant verbalizes understanding of the signs/symptoms and immediate care of hyper/hypoglycemia, proper foot care and importance of medication, aerobic/resistive exercise and nutrition plan for blood glucose control.;Long Term: Attainment of HbA1C < 7%.    Hypertension  Yes    Intervention  Provide education on lifestyle modifcations including regular physical activity/exercise, weight management, moderate sodium restriction and increased consumption of fresh fruit, vegetables, and low fat dairy, alcohol moderation, and smoking cessation.;Monitor prescription use compliance.    Expected Outcomes  Long Term: Maintenance of blood pressure at goal levels.;Short Term: Continued assessment and intervention until BP is < 140/74mm HG in hypertensive participants. < 130/12mm HG in hypertensive participants  with diabetes, heart failure or chronic kidney disease.    Lipids  Yes    Intervention  Provide education and support for participant on nutrition & aerobic/resistive exercise along with prescribed medications to achieve LDL 70mg , HDL >40mg .    Expected Outcomes  Short Term: Participant states understanding of desired cholesterol values and is compliant with medications prescribed. Participant is following exercise prescription and nutrition guidelines.;Long Term: Cholesterol controlled with medications as prescribed, with individualized exercise RX and with personalized nutrition plan. Value goals: LDL < 70mg , HDL > 40 mg.       Core Components/Risk Factors/Patient Goals Review:    Core Components/Risk Factors/Patient Goals at Discharge (Final Review):    ITP Comments: ITP Comments    Row Name 03/05/17 1343           ITP Comments  Dr. Fransico Him, Medical Director           Comments: Pt started cardiac rehab today.  Pt tolerated light exercise without difficulty. VSS, telemetry-S[facility] Rhtyhm, asymptomatic.   Medication list reconciled. Pt denies barriers to medicaiton compliance.  PSYCHOSOCIAL ASSESSMENT:  PHQ-0. Pt exhibits positive coping skills, hopeful outlook with supportive family. No psychosocial needs identified at this time, no psychosocial interventions necessary.    Pt enjoys making money and playing cards.   Pt oriented to exercise equipment and routine.    Understanding verbalized.Barnet Pall, RN,BSN 03/15/2017 3:07 PM

## 2017-03-18 ENCOUNTER — Encounter (HOSPITAL_COMMUNITY)
Admission: RE | Admit: 2017-03-18 | Discharge: 2017-03-18 | Disposition: A | Discharge status: Home / Self Care | Payer: Medicare Other | Source: Ambulatory Visit | Attending: Cardiology | Admitting: Cardiology

## 2017-03-18 DIAGNOSIS — Z955 Presence of coronary angioplasty implant and graft: Secondary | ICD-10-CM | POA: Diagnosis not present

## 2017-03-18 LAB — GLUCOSE, CAPILLARY
GLUCOSE-CAPILLARY: 227 mg/dL — AB (ref 65–99)
GLUCOSE-CAPILLARY: 196 mg/dL — AB (ref 65–99)

## 2017-03-20 ENCOUNTER — Encounter (HOSPITAL_COMMUNITY)
Admission: RE | Admit: 2017-03-20 | Discharge: 2017-03-20 | Disposition: A | Discharge status: Home / Self Care | Payer: Medicare Other | Source: Ambulatory Visit | Attending: Cardiology | Admitting: Cardiology

## 2017-03-20 DIAGNOSIS — Z955 Presence of coronary angioplasty implant and graft: Secondary | ICD-10-CM | POA: Insufficient documentation

## 2017-03-20 LAB — GLUCOSE, CAPILLARY
GLUCOSE-CAPILLARY: 119 mg/dL — AB (ref 65–99)
Glucose-Capillary: 140 mg/dL — ABNORMAL HIGH (ref 65–99)

## 2017-03-20 NOTE — Progress Notes (Signed)
I have reviewed a Home Exercise Prescription with [name] . Darrold is currently exercising at home. He is exercising 5 days week using the elliptical at home for 30-45 minutes. The patient was advised he could also walk on days he does not feel like doing the elliptical for 30-45 minutes.  Altamese Dilling and I discussed how to progress their exercise prescription. The patient stated that they understand the exercise prescription.  We reviewed exercise guidelines, target heart rate during exercise, weather and endpoints for exercise.  Patient is encouraged to come to me with any questions. I will continue to follow up with the patient to assist them with progression and safety.    Carma Lair MS, ACSM CEP 03/20/2017 4:12 PM

## 2017-03-22 ENCOUNTER — Encounter (HOSPITAL_COMMUNITY)
Admission: RE | Admit: 2017-03-22 | Discharge: 2017-03-22 | Disposition: A | Discharge status: Home / Self Care | Payer: Medicare Other | Source: Ambulatory Visit | Attending: Cardiology | Admitting: Cardiology

## 2017-03-22 DIAGNOSIS — Z955 Presence of coronary angioplasty implant and graft: Secondary | ICD-10-CM | POA: Diagnosis not present

## 2017-03-22 LAB — GLUCOSE, CAPILLARY: Glucose-Capillary: 84 mg/dL (ref 65–99)

## 2017-03-22 NOTE — Progress Notes (Signed)
Incomplete Session Note  Patient Details  Name: [name] MRN: [id number] Date of Birth: 1946-01-13 Referring Provider:     CARDIAC REHAB PHASE II ORIENTATION from 03/05/2017 in [place]  Referring Provider  Patwardhan, Reynold Bowen, MD      [name] did not complete his rehab session.  [name] CBG this afternoon was 84. No exercise this afternoon per protocol. [name] said he ate last around 8:30. Patient was given a banana lemonade and some graham crackers. I reminded [name] to eat something at least an hour prior to coming to exercise. I will ask the dietitian talk with [name]. [name] does not check his CBG's at home. Patient plans to return to exercise on Monday.Barnet Pall, RN,BSN 03/22/2017 3:26 PM

## 2017-03-25 ENCOUNTER — Encounter (HOSPITAL_COMMUNITY)
Admission: RE | Admit: 2017-03-25 | Discharge: 2017-03-25 | Disposition: A | Discharge status: Home / Self Care | Payer: Medicare Other | Source: Ambulatory Visit | Attending: Cardiology | Admitting: Cardiology

## 2017-03-25 DIAGNOSIS — M7751 Other enthesopathy of right foot: Secondary | ICD-10-CM | POA: Diagnosis not present

## 2017-03-25 DIAGNOSIS — Z955 Presence of coronary angioplasty implant and graft: Secondary | ICD-10-CM | POA: Diagnosis not present

## 2017-03-25 DIAGNOSIS — G5761 Lesion of plantar nerve, right lower limb: Secondary | ICD-10-CM | POA: Diagnosis not present

## 2017-03-25 LAB — GLUCOSE, CAPILLARY: Glucose-Capillary: 163 mg/dL — ABNORMAL HIGH (ref 65–99)

## 2017-03-27 ENCOUNTER — Encounter (HOSPITAL_COMMUNITY): Payer: Medicare Other

## 2017-03-29 ENCOUNTER — Encounter (HOSPITAL_COMMUNITY): Payer: Medicare Other

## 2017-04-01 ENCOUNTER — Encounter (HOSPITAL_COMMUNITY)
Admission: RE | Admit: 2017-04-01 | Discharge: 2017-04-01 | Disposition: A | Discharge status: Home / Self Care | Payer: Medicare Other | Source: Ambulatory Visit | Attending: Cardiology | Admitting: Cardiology

## 2017-04-01 DIAGNOSIS — Z955 Presence of coronary angioplasty implant and graft: Secondary | ICD-10-CM

## 2017-04-01 LAB — GLUCOSE, CAPILLARY: Glucose-Capillary: 172 mg/dL — ABNORMAL HIGH (ref 65–99)

## 2017-04-03 ENCOUNTER — Encounter (HOSPITAL_COMMUNITY)
Admission: RE | Admit: 2017-04-03 | Discharge: 2017-04-03 | Disposition: A | Discharge status: Home / Self Care | Payer: Medicare Other | Source: Ambulatory Visit | Attending: Cardiology | Admitting: Cardiology

## 2017-04-03 DIAGNOSIS — Z955 Presence of coronary angioplasty implant and graft: Secondary | ICD-10-CM

## 2017-04-03 LAB — GLUCOSE, CAPILLARY: Glucose-Capillary: 131 mg/dL — ABNORMAL HIGH (ref 65–99)

## 2017-04-05 ENCOUNTER — Encounter (HOSPITAL_COMMUNITY)
Admission: RE | Admit: 2017-04-05 | Discharge: 2017-04-05 | Disposition: A | Discharge status: Home / Self Care | Payer: Medicare Other | Source: Ambulatory Visit | Attending: Cardiology | Admitting: Cardiology

## 2017-04-05 DIAGNOSIS — Z955 Presence of coronary angioplasty implant and graft: Secondary | ICD-10-CM | POA: Diagnosis not present

## 2017-04-05 LAB — GLUCOSE, CAPILLARY: GLUCOSE-CAPILLARY: 190 mg/dL — AB (ref 65–99)

## 2017-04-08 ENCOUNTER — Encounter (HOSPITAL_COMMUNITY)
Admission: RE | Admit: 2017-04-08 | Discharge: 2017-04-08 | Disposition: A | Discharge status: Home / Self Care | Payer: Medicare Other | Source: Ambulatory Visit | Attending: Cardiology | Admitting: Cardiology

## 2017-04-08 DIAGNOSIS — Z955 Presence of coronary angioplasty implant and graft: Secondary | ICD-10-CM

## 2017-04-08 LAB — GLUCOSE, CAPILLARY: GLUCOSE-CAPILLARY: 153 mg/dL — AB (ref 65–99)

## 2017-04-10 ENCOUNTER — Encounter (HOSPITAL_COMMUNITY)
Admission: RE | Admit: 2017-04-10 | Discharge: 2017-04-10 | Disposition: A | Discharge status: Home / Self Care | Payer: Medicare Other | Source: Ambulatory Visit | Attending: Cardiology | Admitting: Cardiology

## 2017-04-10 DIAGNOSIS — Z955 Presence of coronary angioplasty implant and graft: Secondary | ICD-10-CM | POA: Diagnosis not present

## 2017-04-10 LAB — GLUCOSE, CAPILLARY: Glucose-Capillary: 156 mg/dL — ABNORMAL HIGH (ref 65–99)

## 2017-04-10 NOTE — Progress Notes (Signed)
Cardiac Individual Treatment Plan  Patient Details  Name: [name] MRN: [id number] Date of Birth: 04-02-1945 Referring Provider:     CARDIAC REHAB PHASE II ORIENTATION from 03/05/2017 in [place]  Referring Provider  Patwardhan, Reynold Bowen, MD      Initial Encounter Date:    CARDIAC REHAB PHASE II ORIENTATION from 03/05/2017 in [place]  Date  03/05/17  Referring Provider  Nigel Mormon, MD      Visit Diagnosis: Status post coronary artery stent placement 01/28/17 DES RCA, PDA  Patient's Home Medications on Admission:  Current Outpatient Medications:  .  amLODipine (NORVASC) 10 MG tablet, Take 1 tablet (10 mg total) daily by mouth., Disp: 30 tablet, Rfl: 3 .  aspirin 81 MG chewable tablet, Chew 1 tablet (81 mg total) daily by mouth., Disp: 90 tablet, Rfl: 3 .  clopidogrel (PLAVIX) 75 MG tablet, Take 1 tablet (75 mg total) daily with breakfast by mouth., Disp: 60 tablet, Rfl: 3 .  lisinopril (PRINIVIL,ZESTRIL) 20 MG tablet, Take 20 mg daily by mouth., Disp: , Rfl: 1 .  metFORMIN (GLUCOPHAGE) 1000 MG tablet, Take 1,000 mg 2 (two) times daily by mouth. , Disp: , Rfl: 0 .  metoprolol succinate (TOPROL-XL) 25 MG 24 hr tablet, Take 1 tablet (25 mg total) daily by mouth., Disp: 30 tablet, Rfl: 3  Past Medical History: Past Medical History:  Diagnosis Date  . Arthritis   . Coronary artery disease   . Diabetes mellitus without complication ([place])    type2  . History of kidney stones   . HLD (hyperlipidemia)   . Hypertension     Tobacco Use: Social History   Tobacco Use  Smoking Status Never Smoker  Smokeless Tobacco Never Used    Labs: Recent Review Scientist, physiological    Labs for ITP Cardiac and Pulmonary Rehab Latest Ref Rng & Units 08/26/2009 08/26/2009 08/26/2009   Hemoglobin A1c <5.7 % - - 6.1 (NOTE)                                                                       According to the ADA Clinical  Practice Recommendations for 2011, when HbA1c is used as a screening test:   >=6.5%   Diagnostic of Diabetes Mellitus           (if abnormal result is confirmed)  5.7-6.4%   Increased risk of developing Diabetes Mellitus  References:Diagnosis and Classification of Diabetes Mellitus,Diabetes XNAT,5573,22(GURKY 1):S62-S69 and Standards of Medical Care in         Diabetes - 2011,Diabetes Care,2011,34 (Suppl 1):S11-S61.(H)   TCO2 0 - 100 mmol/L 21 20 -      Capillary Blood Glucose: Lab Results  Component Value Date   GLUCAP 156 (H) 04/10/2017   GLUCAP 153 (H) 04/08/2017   GLUCAP 190 (H) 04/05/2017   GLUCAP 131 (H) 04/03/2017   GLUCAP 172 (H) 04/01/2017     Exercise Target Goals:    Exercise Program Goal: Individual exercise prescription set using results from initial 6 min walk test and THRR while considering  patient's activity barriers and safety.   Exercise Prescription Goal: Initial exercise prescription builds to 30-45 minutes a day of aerobic activity, 2-3  days per week.  Home exercise guidelines will be given to patient during program as part of exercise prescription that the participant will acknowledge.  Activity Barriers & Risk Stratification: Activity Barriers & Cardiac Risk Stratification - 03/05/17 1602      Activity Barriers & Cardiac Risk Stratification   Activity Barriers  Arthritis;History of Falls;Back Problems;Neck/Spine Problems    Cardiac Risk Stratification  Moderate       6 Minute Walk: 6 Minute Walk    Row Name 03/05/17 1556         6 Minute Walk   Phase  Initial     Distance  2000 feet     Walk Time  6 minutes     # of Rest Breaks  0     MPH  3.79     METS  4.42     RPE  12     VO2 Peak  15.49     Symptoms  No     Resting HR  78 bpm     Resting BP  142/74     Resting Oxygen Saturation   100 %     Exercise Oxygen Saturation  during 6 min walk  100 %     Max Ex. HR  112 bpm     Max Ex. BP  142/82     2 Minute Post BP  138/64         Oxygen Initial Assessment:   Oxygen Re-Evaluation:   Oxygen Discharge (Final Oxygen Re-Evaluation):   Initial Exercise Prescription: Initial Exercise Prescription - 03/05/17 1500      Date of Initial Exercise RX and Referring Provider   Date  03/05/17    Referring Provider  Patwardhan, Reynold Bowen, MD      Recumbant Bike   Level  2    Watts  35    Minutes  3    METs  3      Track   Laps  13    Minutes  10    METs  3.26      Prescription Details   Frequency (times per week)  3    Duration  Progress to 45 minutes of aerobic exercise without signs/symptoms of physical distress      Intensity   THRR 40-80% of Max Heartrate  60-121    Ratings of Perceived Exertion  11-13    Perceived Dyspnea  0-4      Progression   Progression  Continue to progress workloads to maintain intensity without signs/symptoms of physical distress.      Resistance Training   Training Prescription  Yes    Weight  4lbs.    Reps  10-15       Perform Capillary Blood Glucose checks as needed.  Exercise Prescription Changes: Exercise Prescription Changes    Row Name 04/01/17 1453             Response to Exercise   Blood Pressure (Admit)  132/80       Blood Pressure (Exercise)  124/82       Blood Pressure (Exit)  102/60       Heart Rate (Admit)  85 bpm       Heart Rate (Exercise)  93 bpm       Heart Rate (Exit)  85 bpm       Rating of Perceived Exertion (Exercise)  11.5       Symptoms  none       Duration  Continue  with 30 min of aerobic exercise without signs/symptoms of physical distress.       Intensity  THRR unchanged         Progression   Progression  Continue to progress workloads to maintain intensity without signs/symptoms of physical distress.       Average METs  3.5         Resistance Training   Training Prescription  Yes       Weight  4lbs.       Reps  10-15       Time  10 Minutes         Interval Training   Interval Training  No         Recumbant Bike    Level  -       Watts  -       Minutes  -       METs  -         Track   Laps  43       Minutes  30       METs  3.49         Home Exercise Plan   Plans to continue exercise at  Home (comment)       Frequency  Add 4 additional days to program exercise sessions.       Initial Home Exercises Provided  03/20/17          Exercise Comments: Exercise Comments    Row Name 03/20/17 1613 04/03/17 1545         Exercise Comments  Reviewed HEP with pt. Pt voiced understanding. Will continue to monitor and follow patient.   Reviewed METs and goals with patient.         Exercise Goals and Review: Exercise Goals    Row Name 03/05/17 1601             Exercise Goals   Increase Physical Activity  Yes       Intervention  Provide advice, education, support and counseling about physical activity/exercise needs.;Develop an individualized exercise prescription for aerobic and resistive training based on initial evaluation findings, risk stratification, comorbidities and participant's personal goals.       Expected Outcomes  Achievement of increased cardiorespiratory fitness and enhanced flexibility, muscular endurance and strength shown through measurements of functional capacity and personal statement of participant.       Increase Strength and Stamina  Yes       Intervention  Provide advice, education, support and counseling about physical activity/exercise needs.;Develop an individualized exercise prescription for aerobic and resistive training based on initial evaluation findings, risk stratification, comorbidities and participant's personal goals.       Expected Outcomes  Achievement of increased cardiorespiratory fitness and enhanced flexibility, muscular endurance and strength shown through measurements of functional capacity and personal statement of participant.       Able to understand and use rate of perceived exertion (RPE) scale  Yes       Intervention  Provide education and explanation  on how to use RPE scale       Expected Outcomes  Short Term: Able to use RPE daily in rehab to express subjective intensity level;Long Term:  Able to use RPE to guide intensity level when exercising independently       Knowledge and understanding of Target Heart Rate Range (THRR)  Yes       Intervention  Provide education and explanation of THRR including how the numbers  were predicted and where they are located for reference       Expected Outcomes  Short Term: Able to state/look up THRR;Long Term: Able to use THRR to govern intensity when exercising independently;Short Term: Able to use daily as guideline for intensity in rehab       Able to check pulse independently  Yes       Intervention  Provide education and demonstration on how to check pulse in carotid and radial arteries.;Review the importance of being able to check your own pulse for safety during independent exercise       Expected Outcomes  Short Term: Able to explain why pulse checking is important during independent exercise;Long Term: Able to check pulse independently and accurately       Understanding of Exercise Prescription  Yes       Intervention  Provide education, explanation, and written materials on patient's individual exercise prescription       Expected Outcomes  Short Term: Able to explain program exercise prescription;Long Term: Able to explain home exercise prescription to exercise independently          Exercise Goals Re-Evaluation : Exercise Goals Re-Evaluation    Row Name 03/20/17 1614 04/03/17 1545           Exercise Goal Re-Evaluation   Exercise Goals Review  Increase Physical Activity;Able to understand and use rate of perceived exertion (RPE) scale;Knowledge and understanding of Target Heart Rate Range (THRR);Understanding of Exercise Prescription;Increase Strength and Stamina  Increase Physical Activity      Comments  Reviewed Home Exercise Program with pt. Reviewed RPE scale, weather conditions, endpoints  of exericse, warmup and cool down and THRR. Pt encouraged to have rest days to allow body to recover form exercise at home and cardiac rehab. Will continue to monitor pt.   Patient is using his elliptical machine at home 35 minutes, 5-6 days/week as his mode of home exercise and states he burns 500 calories when using his elliptical.       Expected Outcomes  Pt will continue to exercise 5 days a week using his elliptical at home for 30-45 minutes.   Continue exercise at home and CR to help achieve health and fitness benefits.          Discharge Exercise Prescription (Final Exercise Prescription Changes): Exercise Prescription Changes - 04/01/17 1453      Response to Exercise   Blood Pressure (Admit)  132/80    Blood Pressure (Exercise)  124/82    Blood Pressure (Exit)  102/60    Heart Rate (Admit)  85 bpm    Heart Rate (Exercise)  93 bpm    Heart Rate (Exit)  85 bpm    Rating of Perceived Exertion (Exercise)  11.5    Symptoms  none    Duration  Continue with 30 min of aerobic exercise without signs/symptoms of physical distress.    Intensity  THRR unchanged      Progression   Progression  Continue to progress workloads to maintain intensity without signs/symptoms of physical distress.    Average METs  3.5      Resistance Training   Training Prescription  Yes    Weight  4lbs.    Reps  10-15    Time  10 Minutes      Interval Training   Interval Training  No      Recumbant Bike   Level  --    Watts  --    Minutes  --  METs  --      Track   Laps  43    Minutes  30    METs  3.49      Home Exercise Plan   Plans to continue exercise at  Home (comment)    Frequency  Add 4 additional days to program exercise sessions.    Initial Home Exercises Provided  03/20/17       Nutrition:  Target Goals: Understanding of nutrition guidelines, daily intake of sodium 1500mg , cholesterol 200mg , calories 30% from fat and 7% or less from saturated fats, daily to have 5 or more  servings of fruits and vegetables.  Biometrics: Pre Biometrics - 03/05/17 1602      Pre Biometrics   Height  6\' 5"  (1.956 m)    Weight  242 lb 15.2 oz (110.2 kg)    Waist Circumference  43 inches    Hip Circumference  41.5 inches    Waist to Hip Ratio  1.04 %    BMI (Calculated)  28.8    Triceps Skinfold  6 mm    % Body Fat  24.9 %    Grip Strength  45.5 kg    Flexibility  12 in    Single Leg Stand  12.9 seconds        Nutrition Therapy Plan and Nutrition Goals: Nutrition Therapy & Goals - 03/06/17 1405      Nutrition Therapy   Diet  Carb Modified, Heart Healthy      Personal Nutrition Goals   Nutrition Goal  Pt to identify and limit food sources of saturated fat, trans fat, and sodium    Personal Goal #2  CBG concentrations in the normal range or as close to normal as is safely possible.      Intervention Plan   Intervention  Prescribe, educate and counsel regarding individualized specific dietary modifications aiming towards targeted core components such as weight, hypertension, lipid management, diabetes, heart failure and other comorbidities.    Expected Outcomes  Short Term Goal: Understand basic principles of dietary content, such as calories, fat, sodium, cholesterol and nutrients.;Long Term Goal: Adherence to prescribed nutrition plan.       Nutrition Assessments: Nutrition Assessments - 03/06/17 1405      MEDFICTS Scores   Pre Score  78       Nutrition Goals Re-Evaluation:   Nutrition Goals Re-Evaluation:   Nutrition Goals Discharge (Final Nutrition Goals Re-Evaluation):   Psychosocial: Target Goals: Acknowledge presence or absence of significant depression and/or stress, maximize coping skills, provide positive support system. Participant is able to verbalize types and ability to use techniques and skills needed for reducing stress and depression.  Initial Review & Psychosocial Screening: Initial Psych Review & Screening - 03/05/17 1405       Initial Review   Current issues with  Current Anxiety/Panic      Family Dynamics   Good Support System?  Yes spouse, friend     Comments  pt with health related anxiety fropm recent cardaic event.  pt concerned about new medications.        Barriers   Psychosocial barriers to participate in program  The patient should benefit from training in stress management and relaxation.      Screening Interventions   Interventions  Encouraged to exercise;Provide feedback about the scores to participant       Quality of Life Scores: Quality of Life - 03/05/17 1406      Quality of Life Scores   Health/Function  Pre  23.13 %    Socioeconomic Pre  22.69 %    Psych/Spiritual Pre  22.4 %    Family Pre  26.4 %    GLOBAL Pre  23.36 %      Scores of 19 and below usually indicate a poorer quality of life in these areas.  A difference of  2-3 points is a clinically meaningful difference.  A difference of 2-3 points in the total score of the Quality of Life Index has been associated with significant improvement in overall quality of life, self-image, physical symptoms, and general health in studies assessing change in quality of life.  PHQ-9: Recent Review Flowsheet Data    Depression screen [facility] 2/9 03/15/2017   Decreased Interest 0   Down, Depressed, Hopeless 0   PHQ - 2 Score 0     Interpretation of Total Score  Total Score Depression Severity:  1-4 = Minimal depression, 5-9 = Mild depression, 10-14 = Moderate depression, 15-19 = Moderately severe depression, 20-27 = Severe depression   Psychosocial Evaluation and Intervention:   Psychosocial Re-Evaluation: Psychosocial Re-Evaluation    [place] Name 04/10/17 1734             Psychosocial Re-Evaluation   Current issues with  None Identified       Comments  [name] has not voiced any concerns about feeling anxious at cardiac rehab.       Interventions  Stress management education;Encouraged to attend Cardiac Rehabilitation for the exercise        Continue Psychosocial Services   No Follow up required          Psychosocial Discharge (Final Psychosocial Re-Evaluation): Psychosocial Re-Evaluation - 04/10/17 1734      Psychosocial Re-Evaluation   Current issues with  None Identified    Comments  [name] has not voiced any concerns about feeling anxious at cardiac rehab.    Interventions  Stress management education;Encouraged to attend Cardiac Rehabilitation for the exercise    Continue Psychosocial Services   No Follow up required       Vocational Rehabilitation: Provide vocational rehab assistance to qualifying candidates.   Vocational Rehab Evaluation & Intervention: Vocational Rehab - 03/05/17 1404      Initial Vocational Rehab Evaluation & Intervention   Assessment shows need for Vocational Rehabilitation  No self employed        Education: Education Goals: Education classes will be provided on a weekly basis, covering required topics. Participant will state understanding/return demonstration of topics presented.  Learning Barriers/Preferences: Learning Barriers/Preferences - 03/05/17 1604      Learning Barriers/Preferences   Learning Barriers  Sight    Learning Preferences  Written Material;Video       Education Topics: Count Your Pulse:  -Group instruction provided by verbal instruction, demonstration, patient participation and written materials to support subject.  Instructors address importance of being able to find your pulse and how to count your pulse when at home without a heart monitor.  Patients get hands on experience counting their pulse with staff help and individually.   Heart Attack, Angina, and Risk Factor Modification:  -Group instruction provided by verbal instruction, video, and written materials to support subject.  Instructors address signs and symptoms of angina and heart attacks.    Also discuss risk factors for heart disease and how to make changes to improve heart health risk  factors.   Functional Fitness:  -Group instruction provided by verbal instruction, demonstration, patient participation, and written materials to support subject.  Instructors address safety measures for doing things around the house.  Discuss how to get up and down off the floor, how to pick things up properly, how to safely get out of a chair without assistance, and balance training.   Meditation and Mindfulness:  -Group instruction provided by verbal instruction, patient participation, and written materials to support subject.  Instructor addresses importance of mindfulness and meditation practice to help reduce stress and improve awareness.  Instructor also leads participants through a meditation exercise.    Stretching for Flexibility and Mobility:  -Group instruction provided by verbal instruction, patient participation, and written materials to support subject.  Instructors lead participants through series of stretches that are designed to increase flexibility thus improving mobility.  These stretches are additional exercise for major muscle groups that are typically performed during regular warm up and cool down.   CARDIAC REHAB PHASE II EXERCISE from 03/22/2017 in [place]  Date  03/22/17  Educator  EP  Instruction Review Code  2- meets goals/outcomes      Hands Only CPR:  -Group verbal, video, and participation provides a basic overview of AHA guidelines for community CPR. Role-play of emergencies allow participants the opportunity to practice calling for help and chest compression technique with discussion of AED use.   Hypertension: -Group verbal and written instruction that provides a basic overview of hypertension including the most recent diagnostic guidelines, risk factor reduction with self-care instructions and medication management.    Nutrition I class: Heart Healthy Eating:  -Group instruction provided by PowerPoint slides, verbal  discussion, and written materials to support subject matter. The instructor gives an explanation and review of the Therapeutic Lifestyle Changes diet recommendations, which includes a discussion on lipid goals, dietary fat, sodium, fiber, plant stanol/sterol esters, sugar, and the components of a well-balanced, healthy diet.   CARDIAC REHAB PHASE II EXERCISE from 03/22/2017 in [place]  Date  03/05/17  Educator  RD  Instruction Review Code  Not applicable      Nutrition II class: Lifestyle Skills:  -Group instruction provided by PowerPoint slides, verbal discussion, and written materials to support subject matter. The instructor gives an explanation and review of label reading, grocery shopping for heart health, heart healthy recipe modifications, and ways to make healthier choices when eating out.   CARDIAC REHAB PHASE II EXERCISE from 03/22/2017 in [place]  Date  03/05/17  Educator  RD  Instruction Review Code  Not applicable      Diabetes Question & Answer:  -Group instruction provided by PowerPoint slides, verbal discussion, and written materials to support subject matter. The instructor gives an explanation and review of diabetes co-morbidities, pre- and post-prandial blood glucose goals, pre-exercise blood glucose goals, signs, symptoms, and treatment of hypoglycemia and hyperglycemia, and foot care basics.   Diabetes Blitz:  -Group instruction provided by PowerPoint slides, verbal discussion, and written materials to support subject matter. The instructor gives an explanation and review of the physiology behind type 1 and type 2 diabetes, diabetes medications and rational behind using different medications, pre- and post-prandial blood glucose recommendations and Hemoglobin A1c goals, diabetes diet, and exercise including blood glucose guidelines for exercising safely.    CARDIAC REHAB PHASE II EXERCISE from 03/22/2017 in  [place]  Date  03/05/17  Educator  RD  Instruction Review Code  Not applicable      Portion Distortion:  -Group instruction provided by  PowerPoint slides, verbal discussion, written materials, and food models to support subject matter. The instructor gives an explanation of serving size versus portion size, changes in portions sizes over the last 20 years, and what consists of a serving from each food group.   Stress Management:  -Group instruction provided by verbal instruction, video, and written materials to support subject matter.  Instructors review role of stress in heart disease and how to cope with stress positively.     Exercising on Your Own:  -Group instruction provided by verbal instruction, power point, and written materials to support subject.  Instructors discuss benefits of exercise, components of exercise, frequency and intensity of exercise, and end points for exercise.  Also discuss use of nitroglycerin and activating EMS.  Review options of places to exercise outside of rehab.  Review guidelines for sex with heart disease.   Cardiac Drugs I:  -Group instruction provided by verbal instruction and written materials to support subject.  Instructor reviews cardiac drug classes: antiplatelets, anticoagulants, beta blockers, and statins.  Instructor discusses reasons, side effects, and lifestyle considerations for each drug class.   Cardiac Drugs II:  -Group instruction provided by verbal instruction and written materials to support subject.  Instructor reviews cardiac drug classes: angiotensin converting enzyme inhibitors (ACE-I), angiotensin II receptor blockers (ARBs), nitrates, and calcium channel blockers.  Instructor discusses reasons, side effects, and lifestyle considerations for each drug class.   Anatomy and Physiology of the Circulatory System:  Group verbal and written instruction and models provide basic cardiac anatomy and  physiology, with the coronary electrical and arterial systems. Review of: AMI, Angina, Valve disease, Heart Failure, Peripheral Artery Disease, Cardiac Arrhythmia, Pacemakers, and the ICD.   Other Education:  -Group or individual verbal, written, or video instructions that support the educational goals of the cardiac rehab program.   Knowledge Questionnaire Score: Knowledge Questionnaire Score - 03/05/17 1405      Knowledge Questionnaire Score   Pre Score  18/24       Core Components/Risk Factors/Patient Goals at Admission: Personal Goals and Risk Factors at Admission - 03/05/17 1554      Core Components/Risk Factors/Patient Goals on Admission   Diabetes  Yes    Intervention  Provide education about signs/symptoms and action to take for hypo/hyperglycemia.;Provide education about proper nutrition, including hydration, and aerobic/resistive exercise prescription along with prescribed medications to achieve blood glucose in normal ranges: Fasting glucose 65-99 mg/dL    Expected Outcomes  Short Term: Participant verbalizes understanding of the signs/symptoms and immediate care of hyper/hypoglycemia, proper foot care and importance of medication, aerobic/resistive exercise and nutrition plan for blood glucose control.;Long Term: Attainment of HbA1C < 7%.    Hypertension  Yes    Intervention  Provide education on lifestyle modifcations including regular physical activity/exercise, weight management, moderate sodium restriction and increased consumption of fresh fruit, vegetables, and low fat dairy, alcohol moderation, and smoking cessation.;Monitor prescription use compliance.    Expected Outcomes  Long Term: Maintenance of blood pressure at goal levels.;Short Term: Continued assessment and intervention until BP is < 140/48mm HG in hypertensive participants. < 130/29mm HG in hypertensive participants with diabetes, heart failure or chronic kidney disease.    Lipids  Yes    Intervention  Provide  education and support for participant on nutrition & aerobic/resistive exercise along with prescribed medications to achieve LDL 70mg , HDL >40mg .    Expected Outcomes  Short Term: Participant states understanding of desired cholesterol values and is compliant with medications prescribed. Participant is  following exercise prescription and nutrition guidelines.;Long Term: Cholesterol controlled with medications as prescribed, with individualized exercise RX and with personalized nutrition plan. Value goals: LDL < 70mg , HDL > 40 mg.       Core Components/Risk Factors/Patient Goals Review:  Goals and Risk Factor Review    Row Name 04/10/17 1732             Core Components/Risk Factors/Patient Goals Review   Personal Goals Review  Diabetes;Lipids       Review  [name]'s CBG's have been stable at cardiac rehab as he does not check his CBG's at home. Vital signs ahve been stable.       Expected Outcomes  [name] will continue to take his medications as prescribed and come to cardiac rehab to exercise.          Core Components/Risk Factors/Patient Goals at Discharge (Final Review):  Goals and Risk Factor Review - 04/10/17 1732      Core Components/Risk Factors/Patient Goals Review   Personal Goals Review  Diabetes;Lipids    Review  [name]'s CBG's have been stable at cardiac rehab as he does not check his CBG's at home. Vital signs ahve been stable.    Expected Outcomes  [name] will continue to take his medications as prescribed and come to cardiac rehab to exercise.       ITP Comments: ITP Comments    Row Name 03/05/17 1343 04/10/17 1731         ITP Comments  Dr. Fransico Him, Medical Director   30 day ITP review. Patient with good attendance and participation in phase 2 cardiac rehab.         Comments: See ITP comments.Barnet Pall, RN,BSN 04/10/2017 5:38 PM

## 2017-04-12 ENCOUNTER — Encounter (HOSPITAL_COMMUNITY)
Admission: RE | Admit: 2017-04-12 | Discharge: 2017-04-12 | Disposition: A | Discharge status: Home / Self Care | Payer: Medicare Other | Source: Ambulatory Visit | Attending: Cardiology | Admitting: Cardiology

## 2017-04-12 DIAGNOSIS — Z955 Presence of coronary angioplasty implant and graft: Secondary | ICD-10-CM

## 2017-04-12 LAB — GLUCOSE, CAPILLARY: GLUCOSE-CAPILLARY: 133 mg/dL — AB (ref 65–99)

## 2017-04-12 NOTE — Progress Notes (Signed)
[name] 72 y.o. male DOB: 1946/01/14 MRN: [id number]      Nutrition Note Dx: DES RCA, PDA  Meds reviewed. Metformin noted  Nutrition Note Spoke with pt. Nutrition plan and survey reviewed with pt. Pt is not currently following a heart healthy diet. Pt is in the pre-contemplative state of change. Pt eats out frequently and does not desire to change his eating out habit at this time. Pt is diabetic and does not check his CBG's at home. Pt expressed understanding of the information reviewed. Pt has received nutrition class handouts. Pt denies any questions re: class handouts at this time.  Nutrition Diagnosis ? Food-and nutrition-related knowledge deficit related to lack of exposure to information as related to diagnosis of: ? CVD ? DM ? Overweight related to excessive energy intake as evidenced by a BMI of 28.8  Nutrition Intervention Pt's individual nutrition plan and goals reviewed with pt. Benefits of adopting Heart Healthy diet discussed when Medficts reviewed  Nutrition Goal(s):  ? Pt to identify and limit food sources of saturated fat, trans fat, and sodium ? CBG concentrations in the normal range or as close to normal as is safely possible.  Plan:  Pt to attend nutrition classes ? Nutrition I ? Nutrition II ? Portion Distortion ? Diabetes Blitz ? Diabetes Q & A Will provide client-centered nutrition education as part of interdisciplinary care.   Monitor and evaluate progress toward nutrition goal with team.  Derek Mound, M.Ed, RD, LDN, CDE 04/12/2017 3:38 PM

## 2017-04-15 ENCOUNTER — Encounter (HOSPITAL_COMMUNITY)
Admission: RE | Admit: 2017-04-15 | Discharge: 2017-04-15 | Disposition: A | Discharge status: Home / Self Care | Payer: Medicare Other | Source: Ambulatory Visit | Attending: Cardiology | Admitting: Cardiology

## 2017-04-15 VITALS — BP 112/62 | HR 91 | Ht 77.0 in | Wt 240.7 lb

## 2017-04-15 DIAGNOSIS — Z955 Presence of coronary angioplasty implant and graft: Secondary | ICD-10-CM

## 2017-04-15 LAB — GLUCOSE, CAPILLARY: Glucose-Capillary: 275 mg/dL — ABNORMAL HIGH (ref 65–99)

## 2017-04-17 ENCOUNTER — Encounter (HOSPITAL_COMMUNITY): Payer: Self-pay | Admitting: *Deleted

## 2017-04-17 ENCOUNTER — Encounter (HOSPITAL_COMMUNITY): Payer: Medicare Other

## 2017-04-17 NOTE — Progress Notes (Signed)
Discharge Progress Report  Patient Details  Name: [name] MRN: [id number] Date of Birth: 12-22-1945 Referring Provider:     CARDIAC REHAB PHASE II ORIENTATION from 03/05/2017 in [place]  Referring Provider  Barajas, Brandon Bowen, MD       Number of Visits: 12  Reason for Discharge:  Early Exit:  Personal  Smoking History:  Social History   Tobacco Use  Smoking Status Never Smoker  Smokeless Tobacco Never Used    Diagnosis:  S/P DES RCA and PDA 01/28/17  ADL UCSD:   Initial Exercise Prescription: Initial Exercise Prescription - 03/05/17 1500      Date of Initial Exercise RX and Referring Provider   Date  03/05/17    Referring Provider  Barajas, Brandon Bowen, MD      Recumbant Bike   Level  2    Watts  35    Minutes  3    METs  3      Track   Laps  13    Minutes  10    METs  3.26      Prescription Details   Frequency (times per week)  3    Duration  Progress to 45 minutes of aerobic exercise without signs/symptoms of physical distress      Intensity   THRR 40-80% of Max Heartrate  60-121    Ratings of Perceived Exertion  11-13    Perceived Dyspnea  0-4      Progression   Progression  Continue to progress workloads to maintain intensity without signs/symptoms of physical distress.      Resistance Training   Training Prescription  Yes    Weight  4lbs.    Reps  10-15       Discharge Exercise Prescription (Final Exercise Prescription Changes): Exercise Prescription Changes - 04/01/17 1453      Response to Exercise   Blood Pressure (Admit)  132/80    Blood Pressure (Exercise)  124/82    Blood Pressure (Exit)  102/60    Heart Rate (Admit)  85 bpm    Heart Rate (Exercise)  93 bpm    Heart Rate (Exit)  85 bpm    Rating of Perceived Exertion (Exercise)  11.5    Symptoms  none    Duration  Continue with 30 min of aerobic exercise without signs/symptoms of physical distress.    Intensity  THRR unchanged      Progression   Progression  Continue to progress workloads to maintain intensity without signs/symptoms of physical distress.    Average METs  3.5      Resistance Training   Training Prescription  Yes    Weight  4lbs.    Reps  10-15    Time  10 Minutes      Interval Training   Interval Training  No      Recumbant Bike   Level  --    Watts  --    Minutes  --    METs  --      Track   Laps  43    Minutes  30    METs  3.49      Home Exercise Plan   Plans to continue exercise at  Home (comment)    Frequency  Add 4 additional days to program exercise sessions.    Initial Home Exercises Provided  03/20/17       Functional Capacity: 6 Minute Walk    Row Name  03/05/17 1556         6 Minute Walk   Phase  Initial     Distance  2000 feet     Walk Time  6 minutes     # of Rest Breaks  0     MPH  3.79     METS  4.42     RPE  12     VO2 Peak  15.49     Symptoms  No     Resting HR  78 bpm     Resting BP  142/74     Resting Oxygen Saturation   100 %     Exercise Oxygen Saturation  during 6 min walk  100 %     Max Ex. HR  112 bpm     Max Ex. BP  142/82     2 Minute Post BP  138/64        Psychological, QOL, Others - Outcomes: PHQ 2/9: Depression screen PHQ 2/9 03/15/2017  Decreased Interest 0  Down, Depressed, Hopeless 0  PHQ - 2 Score 0    Quality of Life: Quality of Life - 03/05/17 1406      Quality of Life Scores   Health/Function Pre  23.13 %    Socioeconomic Pre  22.69 %    Psych/Spiritual Pre  22.4 %    Family Pre  26.4 %    GLOBAL Pre  23.36 %       Personal Goals: Goals established at orientation with interventions provided to work toward goal. Personal Goals and Risk Factors at Admission - 03/05/17 1554      Core Components/Risk Factors/Patient Goals on Admission   Diabetes  Yes    Intervention  Provide education about signs/symptoms and action to take for hypo/hyperglycemia.;Provide education about proper nutrition, including hydration, and  aerobic/resistive exercise prescription along with prescribed medications to achieve blood glucose in normal ranges: Fasting glucose 65-99 mg/dL    Expected Outcomes  Short Term: Participant verbalizes understanding of the signs/symptoms and immediate care of hyper/hypoglycemia, proper foot care and importance of medication, aerobic/resistive exercise and nutrition plan for blood glucose control.;Long Term: Attainment of HbA1C < 7%.    Hypertension  Yes    Intervention  Provide education on lifestyle modifcations including regular physical activity/exercise, weight management, moderate sodium restriction and increased consumption of fresh fruit, vegetables, and low fat dairy, alcohol moderation, and smoking cessation.;Monitor prescription use compliance.    Expected Outcomes  Long Term: Maintenance of blood pressure at goal levels.;Short Term: Continued assessment and intervention until BP is < 140/14mm HG in hypertensive participants. < 130/83mm HG in hypertensive participants with diabetes, heart failure or chronic kidney disease.    Lipids  Yes    Intervention  Provide education and support for participant on nutrition & aerobic/resistive exercise along with prescribed medications to achieve LDL 70mg , HDL >40mg .    Expected Outcomes  Short Term: Participant states understanding of desired cholesterol values and is compliant with medications prescribed. Participant is following exercise prescription and nutrition guidelines.;Long Term: Cholesterol controlled with medications as prescribed, with individualized exercise RX and with personalized nutrition plan. Value goals: LDL < 70mg , HDL > 40 mg.        Personal Goals Discharge: Goals and Risk Factor Review    Row Name 04/10/17 1732             Core Components/Risk Factors/Patient Goals Review   Personal Goals Review  Diabetes;Lipids       Review  [name]'s CBG's have been stable at cardiac rehab as he does not check his CBG's at home. Vital  signs ahve been stable.       Expected Outcomes  [name] will continue to take his medications as prescribed and come to cardiac rehab to exercise.          Exercise Goals and Review: Exercise Goals    Row Name 03/05/17 1601             Exercise Goals   Increase Physical Activity  Yes       Intervention  Provide advice, education, support and counseling about physical activity/exercise needs.;Develop an individualized exercise prescription for aerobic and resistive training based on initial evaluation findings, risk stratification, comorbidities and participant's personal goals.       Expected Outcomes  Achievement of increased cardiorespiratory fitness and enhanced flexibility, muscular endurance and strength shown through measurements of functional capacity and personal statement of participant.       Increase Strength and Stamina  Yes       Intervention  Provide advice, education, support and counseling about physical activity/exercise needs.;Develop an individualized exercise prescription for aerobic and resistive training based on initial evaluation findings, risk stratification, comorbidities and participant's personal goals.       Expected Outcomes  Achievement of increased cardiorespiratory fitness and enhanced flexibility, muscular endurance and strength shown through measurements of functional capacity and personal statement of participant.       Able to understand and use rate of perceived exertion (RPE) scale  Yes       Intervention  Provide education and explanation on how to use RPE scale       Expected Outcomes  Short Term: Able to use RPE daily in rehab to express subjective intensity level;Long Term:  Able to use RPE to guide intensity level when exercising independently       Knowledge and understanding of Target Heart Rate Range (THRR)  Yes       Intervention  Provide education and explanation of THRR including how the numbers were predicted and where they are located for  reference       Expected Outcomes  Short Term: Able to state/look up THRR;Long Term: Able to use THRR to govern intensity when exercising independently;Short Term: Able to use daily as guideline for intensity in rehab       Able to check pulse independently  Yes       Intervention  Provide education and demonstration on how to check pulse in carotid and radial arteries.;Review the importance of being able to check your own pulse for safety during independent exercise       Expected Outcomes  Short Term: Able to explain why pulse checking is important during independent exercise;Long Term: Able to check pulse independently and accurately       Understanding of Exercise Prescription  Yes       Intervention  Provide education, explanation, and written materials on patient's individual exercise prescription       Expected Outcomes  Short Term: Able to explain program exercise prescription;Long Term: Able to explain home exercise prescription to exercise independently          Nutrition & Weight - Outcomes: Pre Biometrics - 03/05/17 1602      Pre Biometrics   Height  6\' 5"  (1.956 m)    Weight  242 lb 15.2 oz (110.2 kg)    Waist Circumference  43 inches    Hip Circumference  41.5 inches  Waist to Hip Ratio  1.04 %    BMI (Calculated)  28.8    Triceps Skinfold  6 mm    % Body Fat  24.9 %    Grip Strength  45.5 kg    Flexibility  12 in    Single Leg Stand  12.9 seconds      Post Biometrics - 04/15/17 1524       Post  Biometrics   Height  6\' 5"  (1.956 m)    Weight  240 lb 11.9 oz (109.2 kg)    BMI (Calculated)  28.54       Nutrition: Nutrition Therapy & Goals - 03/06/17 1405      Nutrition Therapy   Diet  Carb Modified, Heart Healthy      Personal Nutrition Goals   Nutrition Goal  Pt to identify and limit food sources of saturated fat, trans fat, and sodium    Personal Goal #2  CBG concentrations in the normal range or as close to normal as is safely possible.       Intervention Plan   Intervention  Prescribe, educate and counsel regarding individualized specific dietary modifications aiming towards targeted core components such as weight, hypertension, lipid management, diabetes, heart failure and other comorbidities.    Expected Outcomes  Short Term Goal: Understand basic principles of dietary content, such as calories, fat, sodium, cholesterol and nutrients.;Long Term Goal: Adherence to prescribed nutrition plan.       Nutrition Discharge: Nutrition Assessments - 03/06/17 1405      MEDFICTS Scores   Pre Score  78       Education Questionnaire Score: Knowledge Questionnaire Score - 03/05/17 1405      Knowledge Questionnaire Score   Pre Score  18/24       Mark attended 12 exercise sessions between 03/05/17- 04/15/17.  Mark's attendance was good but the group exercise was not for him. [name] only liked to walk the track and did not participate in group cool down exercise post exercise. Mr Leverich plans to continue exercise on his own.Barnet Pall, RN,BSN 05/07/2017 12:19 PM

## 2017-04-19 ENCOUNTER — Encounter (HOSPITAL_COMMUNITY): Payer: Medicare Other

## 2017-04-22 ENCOUNTER — Encounter (HOSPITAL_COMMUNITY): Payer: Medicare Other

## 2017-04-24 ENCOUNTER — Encounter (HOSPITAL_COMMUNITY): Payer: Medicare Other

## 2017-04-26 ENCOUNTER — Encounter (HOSPITAL_COMMUNITY): Payer: Medicare Other

## 2017-04-29 ENCOUNTER — Encounter (HOSPITAL_COMMUNITY): Payer: Medicare Other

## 2017-04-30 DIAGNOSIS — G5761 Lesion of plantar nerve, right lower limb: Secondary | ICD-10-CM | POA: Diagnosis not present

## 2017-04-30 DIAGNOSIS — M7751 Other enthesopathy of right foot: Secondary | ICD-10-CM | POA: Diagnosis not present

## 2017-05-01 ENCOUNTER — Encounter (HOSPITAL_COMMUNITY): Payer: Medicare Other

## 2017-05-03 ENCOUNTER — Encounter (HOSPITAL_COMMUNITY): Payer: Medicare Other

## 2017-05-06 ENCOUNTER — Encounter (HOSPITAL_COMMUNITY): Payer: Medicare Other

## 2017-05-08 ENCOUNTER — Encounter (HOSPITAL_COMMUNITY): Payer: Medicare Other

## 2017-05-08 DIAGNOSIS — E119 Type 2 diabetes mellitus without complications: Secondary | ICD-10-CM | POA: Diagnosis not present

## 2017-05-08 DIAGNOSIS — I10 Essential (primary) hypertension: Secondary | ICD-10-CM | POA: Diagnosis not present

## 2017-05-08 DIAGNOSIS — Z794 Long term (current) use of insulin: Secondary | ICD-10-CM | POA: Diagnosis not present

## 2017-05-08 DIAGNOSIS — I2510 Atherosclerotic heart disease of native coronary artery without angina pectoris: Secondary | ICD-10-CM | POA: Diagnosis not present

## 2017-05-10 ENCOUNTER — Encounter (HOSPITAL_COMMUNITY): Payer: Medicare Other

## 2017-05-13 ENCOUNTER — Encounter (HOSPITAL_COMMUNITY): Payer: Medicare Other

## 2017-05-15 ENCOUNTER — Encounter (HOSPITAL_COMMUNITY): Payer: Medicare Other

## 2017-05-15 DIAGNOSIS — R972 Elevated prostate specific antigen [PSA]: Secondary | ICD-10-CM | POA: Diagnosis not present

## 2017-05-17 ENCOUNTER — Encounter (HOSPITAL_COMMUNITY): Payer: Medicare Other

## 2017-05-20 ENCOUNTER — Encounter (HOSPITAL_COMMUNITY): Payer: Medicare Other

## 2017-05-21 DIAGNOSIS — M7751 Other enthesopathy of right foot: Secondary | ICD-10-CM | POA: Diagnosis not present

## 2017-05-21 DIAGNOSIS — G5761 Lesion of plantar nerve, right lower limb: Secondary | ICD-10-CM | POA: Diagnosis not present

## 2017-05-22 ENCOUNTER — Encounter (HOSPITAL_COMMUNITY): Payer: Medicare Other

## 2017-05-24 ENCOUNTER — Encounter (HOSPITAL_COMMUNITY): Payer: Medicare Other

## 2017-05-27 ENCOUNTER — Encounter (HOSPITAL_COMMUNITY): Payer: Medicare Other

## 2017-05-27 DIAGNOSIS — R972 Elevated prostate specific antigen [PSA]: Secondary | ICD-10-CM | POA: Diagnosis not present

## 2017-05-27 DIAGNOSIS — R3912 Poor urinary stream: Secondary | ICD-10-CM | POA: Diagnosis not present

## 2017-05-27 DIAGNOSIS — N401 Enlarged prostate with lower urinary tract symptoms: Secondary | ICD-10-CM | POA: Diagnosis not present

## 2017-05-29 ENCOUNTER — Encounter (HOSPITAL_COMMUNITY): Payer: Medicare Other

## 2017-05-29 DIAGNOSIS — I10 Essential (primary) hypertension: Secondary | ICD-10-CM | POA: Diagnosis not present

## 2017-05-31 ENCOUNTER — Encounter (HOSPITAL_COMMUNITY): Payer: Medicare Other

## 2017-06-03 ENCOUNTER — Encounter (HOSPITAL_COMMUNITY): Payer: Medicare Other

## 2017-06-20 DIAGNOSIS — E119 Type 2 diabetes mellitus without complications: Secondary | ICD-10-CM | POA: Diagnosis not present

## 2017-06-20 DIAGNOSIS — R809 Proteinuria, unspecified: Secondary | ICD-10-CM | POA: Diagnosis not present

## 2017-06-20 DIAGNOSIS — E118 Type 2 diabetes mellitus with unspecified complications: Secondary | ICD-10-CM | POA: Diagnosis not present

## 2017-06-20 DIAGNOSIS — E1165 Type 2 diabetes mellitus with hyperglycemia: Secondary | ICD-10-CM | POA: Diagnosis not present

## 2017-06-20 DIAGNOSIS — E782 Mixed hyperlipidemia: Secondary | ICD-10-CM | POA: Diagnosis not present

## 2017-06-20 DIAGNOSIS — Z7984 Long term (current) use of oral hypoglycemic drugs: Secondary | ICD-10-CM | POA: Diagnosis not present

## 2017-06-20 DIAGNOSIS — N401 Enlarged prostate with lower urinary tract symptoms: Secondary | ICD-10-CM | POA: Diagnosis not present

## 2017-07-09 DIAGNOSIS — M7751 Other enthesopathy of right foot: Secondary | ICD-10-CM | POA: Diagnosis not present

## 2017-07-09 DIAGNOSIS — G5761 Lesion of plantar nerve, right lower limb: Secondary | ICD-10-CM | POA: Diagnosis not present

## 2017-08-07 DIAGNOSIS — E782 Mixed hyperlipidemia: Secondary | ICD-10-CM | POA: Diagnosis not present

## 2017-08-07 DIAGNOSIS — I2510 Atherosclerotic heart disease of native coronary artery without angina pectoris: Secondary | ICD-10-CM | POA: Diagnosis not present

## 2017-08-07 DIAGNOSIS — Z794 Long term (current) use of insulin: Secondary | ICD-10-CM | POA: Diagnosis not present

## 2017-08-07 DIAGNOSIS — E119 Type 2 diabetes mellitus without complications: Secondary | ICD-10-CM | POA: Diagnosis not present

## 2017-08-07 DIAGNOSIS — I10 Essential (primary) hypertension: Secondary | ICD-10-CM | POA: Diagnosis not present

## 2017-08-16 DIAGNOSIS — M7751 Other enthesopathy of right foot: Secondary | ICD-10-CM | POA: Diagnosis not present

## 2017-08-16 DIAGNOSIS — G5761 Lesion of plantar nerve, right lower limb: Secondary | ICD-10-CM | POA: Diagnosis not present

## 2017-09-09 ENCOUNTER — Ambulatory Visit: Payer: Medicare Other | Admitting: Podiatry

## 2017-09-09 DIAGNOSIS — G5761 Lesion of plantar nerve, right lower limb: Secondary | ICD-10-CM | POA: Diagnosis not present

## 2017-09-16 ENCOUNTER — Ambulatory Visit (INDEPENDENT_AMBULATORY_CARE_PROVIDER_SITE_OTHER): Payer: Medicare Other

## 2017-09-16 ENCOUNTER — Encounter: Payer: Self-pay | Admitting: Podiatry

## 2017-09-16 ENCOUNTER — Ambulatory Visit (INDEPENDENT_AMBULATORY_CARE_PROVIDER_SITE_OTHER): Payer: Medicare Other | Admitting: Podiatry

## 2017-09-16 VITALS — BP 145/85 | HR 76

## 2017-09-16 DIAGNOSIS — D3613 Benign neoplasm of peripheral nerves and autonomic nervous system of lower limb, including hip: Secondary | ICD-10-CM | POA: Diagnosis not present

## 2017-09-16 DIAGNOSIS — D3610 Benign neoplasm of peripheral nerves and autonomic nervous system, unspecified: Secondary | ICD-10-CM

## 2017-09-16 NOTE — Patient Instructions (Signed)
Pre-Operative Instructions  Congratulations, you have decided to take an important step towards improving your quality of life.  You can be assured that the doctors and staff at [facility] will be with you every step of the way.  Here are some important things you should know:  1. Plan to be at the surgery center/hospital at least 1 (one) hour prior to your scheduled time, unless otherwise directed by the surgical center/hospital staff.  You must have a responsible adult accompany you, remain during the surgery and drive you home.  Make sure you have directions to the surgical center/hospital to ensure you arrive on time. 2. If you are having surgery at [facility] or [facility], you will need a copy of your medical history and physical form from your family physician within one month prior to the date of surgery. We will give you a form for your primary physician to complete.  3. We make every effort to accommodate the date you request for surgery.  However, there are times where surgery dates or times have to be moved.  We will contact you as soon as possible if a change in schedule is required.   4. No aspirin/ibuprofen for one week before surgery.  If you are on aspirin, any non-steroidal anti-inflammatory medications (Mobic, Aleve, Ibuprofen) should not be taken seven (7) days prior to your surgery.  You make take Tylenol for pain prior to surgery.  5. Medications - If you are taking daily heart and blood pressure medications, seizure, reflux, allergy, asthma, anxiety, pain or diabetes medications, make sure you notify the surgery center/hospital before the day of surgery so they can tell you which medications you should take or avoid the day of surgery. 6. No food or drink after midnight the night before surgery unless directed otherwise by surgical center/hospital staff. 7. No alcoholic beverages 24-hours prior to surgery.  No smoking 24-hours prior or 24-hours after  surgery. 8. Wear loose pants or shorts. They should be loose enough to fit over bandages, boots, and casts. 9. Don't wear slip-on shoes. Sneakers are preferred. 10. Bring your boot with you to the surgery center/hospital.  Also bring crutches or a walker if your physician has prescribed it for you.  If you do not have this equipment, it will be provided for you after surgery. 11. If you have not been contacted by the surgery center/hospital by the day before your surgery, call to confirm the date and time of your surgery. 12. Leave-time from work may vary depending on the type of surgery you have.  Appropriate arrangements should be made prior to surgery with your employer. 13. Prescriptions will be provided immediately following surgery by your doctor.  Fill these as soon as possible after surgery and take the medication as directed. Pain medications will not be refilled on weekends and must be approved by the doctor. 14. Remove nail polish on the operative foot and avoid getting pedicures prior to surgery. 15. Wash the night before surgery.  The night before surgery wash the foot and leg well with water and the antibacterial soap provided. Be sure to pay special attention to beneath the toenails and in between the toes.  Wash for at least three (3) minutes. Rinse thoroughly with water and dry well with a towel.  Perform this wash unless told not to do so by your physician.  Enclosed: 1 Ice pack (please put in freezer the night before surgery)   1 Hibiclens skin cleaner     Pre-op instructions  If you have any questions regarding the instructions, please do not hesitate to call our office.  [place]: [address], [place], [place] [address] -- [phone]  [place]: [address] [place], [place] [address] -- [phone]  [place]: [address]  [place], [place] [address] -- [phone]  [place]: [address], [address], [place], [place] [address] -- [phone]  Website:  [email or web address] 

## 2017-09-22 NOTE — Progress Notes (Signed)
   HPI: 72 year old male presenting today as a new patient with a chief complaint of pain to the ball of the right foot that began about 18 months ago. He states the pain is constant and walking makes it worse. He has received injections from Dr. Gershon Mussel and has tried inserts from the [employer] with no significant relief. Patient is here for further evaluation and treatment.   Past Medical History:  Diagnosis Date  . Arthritis   . Coronary artery disease   . Diabetes mellitus without complication ([place])    type2  . History of kidney stones   . HLD (hyperlipidemia)   . Hypertension      Physical Exam: General: The patient is alert and oriented x3 in no acute distress.  Dermatology: Skin is warm, dry and supple bilateral lower extremities. Negative for open lesions or macerations.  Vascular: Palpable pedal pulses bilaterally. No edema or erythema noted. Capillary refill within normal limits.  Neurological: Epicritic and protective threshold grossly intact bilaterally.   Musculoskeletal Exam: Sharp pain with palpation of the 2nd interspace and lateral compression of the metatarsal heads consistent with neuroma.  Positive [place] Canal sign with loadbearing of the forefoot.  Radiographic Exam:  Normal osseous mineralization. Joint spaces preserved. No fracture/dislocation/boney destruction.    Assessment: 1.  Morton's neuroma 2nd interspace right foot   Plan of Care:  1. Patient was evaluated. X-Rays reviewed.  2. Today we discussed the conservative versus surgical management of the presenting pathology. The patient opts for surgical management. All possible complications and details of the procedure were explained. All patient questions were answered. No guarantees were expressed or implied. 3. Authorization for surgery was initiated today. Surgery will consist of excision of the Morton's neuroma of the 2nd interspace of the right foot.  4. Post op shoe dispensed.  5. Return to clinic  one week post op.    Edrick Kins, DPM [facility]  Dr. Edrick Kins, [address]                                        [name], [place] [address]                Office [phone]  Fax [phone]

## 2017-09-26 ENCOUNTER — Encounter: Payer: Self-pay | Admitting: *Deleted

## 2017-10-01 DIAGNOSIS — J0101 Acute recurrent maxillary sinusitis: Secondary | ICD-10-CM | POA: Diagnosis not present

## 2017-10-01 DIAGNOSIS — Z6825 Body mass index (BMI) 25.0-25.9, adult: Secondary | ICD-10-CM | POA: Diagnosis not present

## 2017-10-04 DIAGNOSIS — J069 Acute upper respiratory infection, unspecified: Secondary | ICD-10-CM | POA: Diagnosis not present

## 2017-10-15 DIAGNOSIS — J029 Acute pharyngitis, unspecified: Secondary | ICD-10-CM | POA: Diagnosis not present

## 2017-10-15 DIAGNOSIS — E119 Type 2 diabetes mellitus without complications: Secondary | ICD-10-CM | POA: Diagnosis not present

## 2017-10-15 DIAGNOSIS — E785 Hyperlipidemia, unspecified: Secondary | ICD-10-CM | POA: Diagnosis not present

## 2017-10-15 DIAGNOSIS — R809 Proteinuria, unspecified: Secondary | ICD-10-CM | POA: Diagnosis not present

## 2017-10-15 DIAGNOSIS — J320 Chronic maxillary sinusitis: Secondary | ICD-10-CM | POA: Diagnosis not present

## 2017-10-15 DIAGNOSIS — R5383 Other fatigue: Secondary | ICD-10-CM | POA: Diagnosis not present

## 2017-10-15 DIAGNOSIS — E118 Type 2 diabetes mellitus with unspecified complications: Secondary | ICD-10-CM | POA: Diagnosis not present

## 2017-10-16 DIAGNOSIS — R3912 Poor urinary stream: Secondary | ICD-10-CM | POA: Diagnosis not present

## 2017-10-16 DIAGNOSIS — N401 Enlarged prostate with lower urinary tract symptoms: Secondary | ICD-10-CM | POA: Diagnosis not present

## 2017-10-16 DIAGNOSIS — R3914 Feeling of incomplete bladder emptying: Secondary | ICD-10-CM | POA: Diagnosis not present

## 2017-10-22 DIAGNOSIS — G5761 Lesion of plantar nerve, right lower limb: Secondary | ICD-10-CM | POA: Diagnosis not present

## 2017-10-23 ENCOUNTER — Telehealth: Payer: Self-pay | Admitting: Podiatry

## 2017-10-23 NOTE — Telephone Encounter (Signed)
[facility] called and stated that they received a request for clearance for the patient and she stated that it was already  sent 09/27/2017. Wanted to make sure we received the initial fax or does she need to re-fax it.   Call [place] at [phone]

## 2017-10-23 NOTE — Telephone Encounter (Signed)
I asked [name] to please refax the medical clearance from 09/27/2017, and she agreed.

## 2017-10-31 DIAGNOSIS — E7800 Pure hypercholesterolemia, unspecified: Secondary | ICD-10-CM | POA: Diagnosis not present

## 2017-10-31 DIAGNOSIS — L852 Keratosis punctata (palmaris et plantaris): Secondary | ICD-10-CM | POA: Diagnosis not present

## 2017-10-31 DIAGNOSIS — G5761 Lesion of plantar nerve, right lower limb: Secondary | ICD-10-CM | POA: Diagnosis not present

## 2017-11-01 ENCOUNTER — Telehealth: Payer: Self-pay | Admitting: Podiatry

## 2017-11-01 NOTE — Telephone Encounter (Signed)
I just had my neuroma taken care of yesterday by Dr. Amalia Hailey. I've got minor pain which doesn't seem appropriate for opioids. So I was wondering if Asprin would work as well for pain? You can give me a call back at [phone]. Thank you thank you.

## 2017-11-01 NOTE — Telephone Encounter (Signed)
Spoke with patient regarding postoperative status.  He states overall that he is doing well, his pain is manageable, he denies fever, chills, nausea and vomiting.  He is worried about taking opioid medication he states he does not feel like his pain is strong enough to take that medication.  He was asking if he could take aspirin, because is what he usually takes for pain.  I told him if he had no problems taking aspirin that he could take aspirin as needed for pain, but not to exceed recommended dosage.  I did advise him to remain in his boot at all times ice and elevate his foot.  He is to follow-up with any questions or concerns he may have.

## 2017-11-06 ENCOUNTER — Ambulatory Visit (INDEPENDENT_AMBULATORY_CARE_PROVIDER_SITE_OTHER): Payer: Medicare Other | Admitting: Podiatry

## 2017-11-06 ENCOUNTER — Ambulatory Visit (INDEPENDENT_AMBULATORY_CARE_PROVIDER_SITE_OTHER): Payer: Medicare Other

## 2017-11-06 ENCOUNTER — Encounter: Payer: Self-pay | Admitting: Podiatry

## 2017-11-06 ENCOUNTER — Other Ambulatory Visit: Payer: Self-pay | Admitting: Podiatry

## 2017-11-06 DIAGNOSIS — G5761 Lesion of plantar nerve, right lower limb: Secondary | ICD-10-CM

## 2017-11-06 DIAGNOSIS — Z9889 Other specified postprocedural states: Secondary | ICD-10-CM | POA: Diagnosis not present

## 2017-11-07 ENCOUNTER — Encounter: Payer: Self-pay | Admitting: Podiatry

## 2017-11-07 DIAGNOSIS — Z794 Long term (current) use of insulin: Secondary | ICD-10-CM | POA: Diagnosis not present

## 2017-11-07 DIAGNOSIS — I2510 Atherosclerotic heart disease of native coronary artery without angina pectoris: Secondary | ICD-10-CM | POA: Diagnosis not present

## 2017-11-07 DIAGNOSIS — I10 Essential (primary) hypertension: Secondary | ICD-10-CM | POA: Diagnosis not present

## 2017-11-07 DIAGNOSIS — E119 Type 2 diabetes mellitus without complications: Secondary | ICD-10-CM | POA: Diagnosis not present

## 2017-11-10 NOTE — Progress Notes (Signed)
   Subjective:  Patient presents today status post excision of neuroma 2nd interspace right foot. DOS: 10/31/17. He states he is doing well. He denies any pain or new complaints at this time. Patient is here for further evaluation and treatment.    Past Medical History:  Diagnosis Date  . Arthritis   . Coronary artery disease   . Diabetes mellitus without complication ([place])    type2  . History of kidney stones   . HLD (hyperlipidemia)   . Hypertension       Objective/Physical Exam Neurovascular status intact.  Skin incisions appear to be well coapted with sutures and staples intact. No sign of infectious process noted. No dehiscence. No active bleeding noted. Moderate edema noted to the surgical extremity.  Radiographic Exam:  Normal osseous mineralization. Joint spaces preserved. No fracture/dislocation/boney destruction.    Assessment: 1. s/p excision of neuroma of the 2nd interspace right foot. DOS: 10/31/17   Plan of Care:  1. Patient was evaluated. X-rays reviewed 2. Dressing changed. Keep clean, dry and intact for one week.  3. Weightbearing in post op shoe.  4. Return to clinic in one week.    Edrick Kins, DPM [facility]  Dr. Edrick Kins, [address]                                        [place], [place] [address]                Office [phone]  Fax [phone]

## 2017-11-13 ENCOUNTER — Telehealth: Payer: Self-pay | Admitting: Podiatry

## 2017-11-13 ENCOUNTER — Ambulatory Visit (INDEPENDENT_AMBULATORY_CARE_PROVIDER_SITE_OTHER): Payer: Medicare Other | Admitting: Podiatry

## 2017-11-13 DIAGNOSIS — Z9889 Other specified postprocedural states: Secondary | ICD-10-CM

## 2017-11-13 DIAGNOSIS — J3489 Other specified disorders of nose and nasal sinuses: Secondary | ICD-10-CM | POA: Insufficient documentation

## 2017-11-13 DIAGNOSIS — J320 Chronic maxillary sinusitis: Secondary | ICD-10-CM | POA: Diagnosis not present

## 2017-11-13 NOTE — Telephone Encounter (Signed)
Pt is seeing Dr. Amalia Hailey in office today.

## 2017-11-13 NOTE — Telephone Encounter (Signed)
Patient was wondering the name of the antibiotic that hes taking.

## 2017-11-16 NOTE — Progress Notes (Signed)
   Subjective:  Patient presents today status post excision of neuroma 2nd interspace right foot. DOS: 10/31/17. He states he is doing very well. He denies any pain or new complaints at this time. Patient is here for further evaluation and treatment.    Past Medical History:  Diagnosis Date  . Arthritis   . Coronary artery disease   . Diabetes mellitus without complication ([place])    type2  . History of kidney stones   . HLD (hyperlipidemia)   . Hypertension       Objective/Physical Exam Neurovascular status intact.  Skin incisions appear to be well coapted with sutures and staples intact. No sign of infectious process noted. No dehiscence. No active bleeding noted. Moderate edema noted to the surgical extremity.  Assessment: 1. s/p excision of neuroma of the 2nd interspace right foot. DOS: 10/31/17   Plan of Care:  1. Patient was evaluated.  2. Sutures removed.  3. Resume wearing good shoe gear.  4. Return to clinic in 2 weeks.    Edrick Kins, DPM [facility]  Dr. Edrick Kins, [place]                                        [place], [place] [address]                Office [phone]  Fax [phone]

## 2017-11-19 DIAGNOSIS — G5761 Lesion of plantar nerve, right lower limb: Secondary | ICD-10-CM | POA: Diagnosis not present

## 2017-11-24 ENCOUNTER — Other Ambulatory Visit: Payer: Self-pay | Admitting: Podiatry

## 2017-11-24 NOTE — Progress Notes (Signed)
Patient called stating that his foot been progressively more swollen for the past few days and is turning red. Reports pain and inability to put weight on the foot unprotected.  Offered antibiotic as precaution but patient states he could not pick it up until tomorrow anyway. In the meantime recommended ice, elevation, and return to surgical shoe or boot. Continue meloxicam.  Advised patient call back in the AM for an appointment for eval.

## 2017-11-25 ENCOUNTER — Ambulatory Visit (INDEPENDENT_AMBULATORY_CARE_PROVIDER_SITE_OTHER): Payer: Medicare Other | Admitting: Podiatry

## 2017-11-25 ENCOUNTER — Other Ambulatory Visit: Payer: Self-pay | Admitting: Podiatry

## 2017-11-25 DIAGNOSIS — L02611 Cutaneous abscess of right foot: Secondary | ICD-10-CM

## 2017-11-25 DIAGNOSIS — L03031 Cellulitis of right toe: Secondary | ICD-10-CM | POA: Diagnosis not present

## 2017-11-25 DIAGNOSIS — L03115 Cellulitis of right lower limb: Secondary | ICD-10-CM

## 2017-11-25 MED ORDER — SULFAMETHOXAZOLE-TRIMETHOPRIM 800-160 MG PO TABS: 1.0000 | ORAL_TABLET | Freq: Two times a day (BID) | ORAL | 0 refills | Status: DC

## 2017-11-25 NOTE — Patient Instructions (Signed)
Betadine/iodine to wound.  Cotton balls to apply.  Dress with large bandaid.

## 2017-11-26 DIAGNOSIS — R972 Elevated prostate specific antigen [PSA]: Secondary | ICD-10-CM | POA: Diagnosis not present

## 2017-11-26 DIAGNOSIS — R3912 Poor urinary stream: Secondary | ICD-10-CM | POA: Diagnosis not present

## 2017-11-26 DIAGNOSIS — N401 Enlarged prostate with lower urinary tract symptoms: Secondary | ICD-10-CM | POA: Diagnosis not present

## 2017-11-27 DIAGNOSIS — L03119 Cellulitis of unspecified part of limb: Secondary | ICD-10-CM | POA: Diagnosis not present

## 2017-11-27 NOTE — Progress Notes (Signed)
   Subjective:  Patient presents today status post excision of neuroma 2nd interspace right foot. DOS: 10/31/17.  Patient was last seen on 11/13/2017 when sutures were removed and the patient was able to transition back into regular tennis shoes.  Patient states of the past few days he noticed significant swelling with redness to the incision site.  He also noticed drainage beginning to come from the incision site.  Called the on-call physician, Dr. March Rummage, for concern of infection and he was placed on oral antibiotics and scheduled for follow-up.  He presents today for further treatment evaluation.    Past Medical History:  Diagnosis Date  . Arthritis   . Coronary artery disease   . Diabetes mellitus without complication ([place])    type2  . History of kidney stones   . HLD (hyperlipidemia)   . Hypertension         Objective/Physical Exam Neurovascular status intact.  There is a significant amount of erythema and edema noted around the incision site with complete dehiscence of the incision.  Serosanguineous drainage noted.  Mild malodor noted.  Erythema and edema does not extend proximal of the foot.  It stays localized around the incision site.  Assessment: 1. s/p excision of neuroma of the 2nd interspace right foot. DOS: 10/31/17 2.  Acute cellulitis right foot   Plan of Care:  1. Patient was evaluated.  2.  Continue oral clindamycin from on-call physician that was called in over the weekend. 3.  Additional prescription for Bactrim DS #20 4.  Today deep wound cultures were taken and sent to pathology for culture and sensitivity 5.  Resume weightbearing in a postoperative shoe.  Discontinue wearing sneakers at the moment. 6.  Recommend the patient apply Betadine and bandage daily to the dressing. 7.  Return to clinic in 1 week  Edrick Kins, DPM [facility]  Dr. Edrick Kins, [place] [address] [place] [address]                                        [place], [place] [address]                 Office [phone]  Fax [phone]

## 2017-11-28 DIAGNOSIS — L03119 Cellulitis of unspecified part of limb: Secondary | ICD-10-CM | POA: Diagnosis not present

## 2017-11-28 LAB — WOUND CULTURE
MICRO NUMBER:: 91075520
SPECIMEN QUALITY: ADEQUATE

## 2017-11-29 ENCOUNTER — Encounter: Payer: Medicare Other | Admitting: Physician Assistant

## 2017-11-29 DIAGNOSIS — K521 Toxic gastroenteritis and colitis: Secondary | ICD-10-CM | POA: Diagnosis not present

## 2017-11-29 DIAGNOSIS — L03119 Cellulitis of unspecified part of limb: Secondary | ICD-10-CM | POA: Diagnosis not present

## 2017-11-29 DIAGNOSIS — T3695XA Adverse effect of unspecified systemic antibiotic, initial encounter: Secondary | ICD-10-CM | POA: Diagnosis not present

## 2017-12-02 ENCOUNTER — Encounter: Payer: Self-pay | Admitting: Sports Medicine

## 2017-12-02 ENCOUNTER — Ambulatory Visit: Payer: Medicare Other | Admitting: Podiatry

## 2017-12-02 ENCOUNTER — Ambulatory Visit (INDEPENDENT_AMBULATORY_CARE_PROVIDER_SITE_OTHER): Payer: Medicare Other | Admitting: Sports Medicine

## 2017-12-02 DIAGNOSIS — T8130XA Disruption of wound, unspecified, initial encounter: Secondary | ICD-10-CM

## 2017-12-02 DIAGNOSIS — L03115 Cellulitis of right lower limb: Secondary | ICD-10-CM

## 2017-12-02 DIAGNOSIS — Z9889 Other specified postprocedural states: Secondary | ICD-10-CM

## 2017-12-02 NOTE — Progress Notes (Signed)
Subjective: [name] is a 72 y.o. male patient seen in office for evaluation of ulceration of the right foot at the area of previous surgery had a neuroma resected on 11/13/2017 and when sutures removed patient experienced dehiscence and opening up of the area with local cellulitis and was seen last week and started on ciprofloxacin and metronidazole and reports that he feels like the redness and swelling has gotten better however has been consistent with dressing the area using a antibiotic Band-Aid and wearing his postop shoe. Denies nausea/fever/vomiting/chills/night sweats/shortness of breath/pain. Patient has no other pedal complaints at this time.  Patient Active Problem List   Diagnosis Date Noted  . Nasal septal perforation 11/13/2017  . Sinus pain 11/13/2017  . Post PTCA 01/28/2017  . Abnormal stress test 01/23/2017   Current Outpatient Medications on File Prior to Visit  Medication Sig Dispense Refill  . finasteride (PROSCAR) 5 MG tablet     . losartan (COZAAR) 25 MG tablet TAKE 1 TABLET BY MOUTH ONCE DAILY    . meloxicam (MOBIC) 15 MG tablet TAKE 1 TABLET BY MOUTH ONCE DAILY    . rosuvastatin (CRESTOR) 20 MG tablet TAKE 1 TABLET BY MOUTH ONCE DAILY    . tamsulosin (FLOMAX) 0.4 MG CAPS capsule TAKE 1 CAPSULE BY MOUTH AT BEDTIME    . amLODipine (NORVASC) 10 MG tablet Take 1 tablet (10 mg total) daily by mouth. 30 tablet 3  . aspirin 81 MG chewable tablet Chew 1 tablet (81 mg total) daily by mouth. 90 tablet 3  . ciprofloxacin (CIPRO) 500 MG tablet TAKE 1 TABLET BY MOUTH EVERY 12 HOURS FOR 7 DAYS  0  . clindamycin (CLEOCIN) 150 MG capsule TAKE 1 CAPSULE BY MOUTH EVERY 6 HOURS FOR 10 DAYS  0  . clopidogrel (PLAVIX) 75 MG tablet Take 1 tablet (75 mg total) daily with breakfast by mouth. 60 tablet 3  . metFORMIN (GLUCOPHAGE) 1000 MG tablet Take 1,000 mg 2 (two) times daily by mouth.   0  . metoprolol succinate (TOPROL-XL) 25 MG 24 hr tablet Take 1 tablet (25 mg total) daily by mouth.  30 tablet 3  . metroNIDAZOLE (FLAGYL) 500 MG tablet Take 500 mg by mouth 3 (three) times daily. for 7 days  0  . sulfamethoxazole-trimethoprim (BACTRIM DS,SEPTRA DS) 800-160 MG tablet Take 1 tablet by mouth 2 (two) times daily. 20 tablet 0   No current facility-administered medications on file prior to visit.    No Known Allergies  Recent Results (from the past 2160 hour(s))  WOUND CULTURE     Status: Abnormal   Collection Time: 11/25/17 12:06 PM  Result Value Ref Range   MICRO NUMBER: [id number]    SPECIMEN QUALITY: ADEQUATE    SOURCE: NOT GIVEN    STATUS: FINAL    GRAM STAIN:      No white blood cells seen Few epithelial cells Many Gram positive cocci in clusters Moderate Gram negative bacilli   ISOLATE 1: Staphylococcus aureus (A)     Comment: Heavy growth of Staphylococcus aureus   ISOLATE 2: Escherichia coli (A)     Comment: Heavy growth of Escherichia coli      Susceptibility   Escherichia coli - AEROBIC CULT, GRAM STAIN NEGATIVE 2    AMPICILLIN 4 Sensitive     CEFEPIME <=1 Sensitive     CEFTRIAXONE <=1 Sensitive     ERTAPENEM <=0.5 Sensitive     IMIPENEM <=0.25 Sensitive     PIP/TAZO <=4 Sensitive  TOBRAMYCIN <=1 Sensitive     AMOX/CLAVULANIC <=2 Sensitive     AMPICILLIN/SULBACTAM 4 Sensitive     CEFAZOLIN* <=4 Not Reportable      * Oxacillin-susceptible staphylococci aresusceptible to other penicillinase-stablepenicillins (e.g. Methicillin, Nafcillin), beta-lactam/beta-lactamase inhibitor combinations, andcephems with staphylococcal indications, includingCefazolin.For infections other than uncomplicated UTIcaused by E. coli, K. pneumoniae or P. mirabilis:Cefazolin is resistant if MIC > or = 8 mcg/mL.(Distinguishing susceptible versus intermediatefor isolates with MIC < or = 4 mcg/mL requiresadditional testing.)    CIPROFLOXACIN <=0.25 Sensitive     LEVOFLOXACIN <=0.12 Sensitive     GENTAMICIN <=1 Sensitive     TRIMETH/SULFA* <=20 Sensitive      * Oxacillin-susceptible  staphylococci aresusceptible to other penicillinase-stablepenicillins (e.g. Methicillin, Nafcillin), beta-lactam/beta-lactamase inhibitor combinations, andcephems with staphylococcal indications, includingCefazolin.For infections other than uncomplicated UTIcaused by E. coli, K. pneumoniae or P. mirabilis:Cefazolin is resistant if MIC > or = 8 mcg/mL.(Distinguishing susceptible versus intermediatefor isolates with MIC < or = 4 mcg/mL requiresadditional testing.)Legend:S = Susceptible  I = IntermediateR = Resistant  NS = Not susceptible* = Not tested  NR = Not reported**NN = See antimicrobic comments   Staphylococcus aureus - AEROBIC CULT, GRAM STAIN POSITIVE 1    VANCOMYCIN <=0.5 Sensitive     CIPROFLOXACIN <=0.5 Sensitive     CLINDAMYCIN <=0.25 Sensitive     LEVOFLOXACIN <=0.12 Sensitive     ERYTHROMYCIN <=0.25 Sensitive     GENTAMICIN <=0.5 Sensitive     OXACILLIN* <=0.25 Sensitive      * Oxacillin-susceptible staphylococci aresusceptible to other penicillinase-stablepenicillins (e.g. Methicillin, Nafcillin), beta-lactam/beta-lactamase inhibitor combinations, andcephems with staphylococcal indications, includingCefazolin.    TETRACYCLINE <=1 Sensitive     TRIMETH/SULFA <=10 Sensitive     Objective: There were no vitals filed for this visit.  General: Patient is awake, alert, oriented x 3 and in no acute distress.  Dermatology: Skin is warm and dry bilateral with a full thickness ulceration present second interspace right foot. Ulceration measures 1 cm x 0.4 cm x 0.6 cm. There is a mildly erythematous border with a 90:10 fibrogranular base. The ulceration does probe to soft tissue in range cannot appreciate bone however the wound does probe very deeply likely into the intermetatarsal space.  There is no malodor, no significant active drainage, focal forefoot edema. No other acute signs of infection.   Vascular: Dorsalis Pedis pulse = 1/4 Bilateral,  Posterior Tibial pulse = 1/4 Bilateral,   Capillary Fill Time < 5 seconds  Neurologic: Gross sensation present via light touch to the right foot.  Musculosketal: There is no pain with palpation to ulcerated area. No pain with compression to calves bilateral.  Hammertoe deformity noted.  No results for input(s): GRAMSTAIN, LABORGA in the last 8760 hours.  Assessment and Plan:  Problem List Items Addressed This Visit    None    Visit Diagnoses    Cellulitis of right foot    -  Primary   Post-operative state       Wound dehiscence         -Examined patient and discussed the progression of the wound and treatment alternatives. -Xrays reviewed - Excisionally dedbrided ulceration at right second interspace to healthy bleeding borders removing nonviable tissue using a sterile chisel blade. Wound measures post debridement as above. Wound was debrided to the level of the dermis with viable wound base exposed to promote healing. Hemostasis was achieved with manuel pressure. Patient tolerated procedure well without any discomfort or anesthesia necessary for this wound debridement.  -Applied Betadine  wet-to-dry sterile dressing and instructed patient to continue with daily dressings at home consisting of the same daily. -Continue with current antibiotics -Continue with postoperative shoe - Advised patient to go to the ER or return to office if the wound worsens or if constitutional symptoms are present. -Patient to return to office in 1 week for follow up care and evaluation or sooner if problems arise.  Patient admits that he wants to go to the wound care center and has already tried to go for an appointment however they told him that he needed to be released per Dr. Amalia Hailey orders in order to start receiving wound care since this is a surgical site.  Landis Martins, DPM

## 2017-12-04 ENCOUNTER — Ambulatory Visit (INDEPENDENT_AMBULATORY_CARE_PROVIDER_SITE_OTHER): Payer: Self-pay | Admitting: Podiatry

## 2017-12-04 DIAGNOSIS — L03115 Cellulitis of right lower limb: Secondary | ICD-10-CM | POA: Diagnosis not present

## 2017-12-04 DIAGNOSIS — Z9889 Other specified postprocedural states: Secondary | ICD-10-CM

## 2017-12-04 MED ORDER — CIPROFLOXACIN HCL 500 MG PO TABS: 500.0000 mg | ORAL_TABLET | Freq: Two times a day (BID) | ORAL | 0 refills | Status: DC

## 2017-12-08 NOTE — Progress Notes (Signed)
   HPI: 72 year old male presents the office today for follow-up evaluation regarding postsurgical infection of a Morton's neuroma resection on 11/13/2017 to the right lower extremity.  Patient was last seen on 12/02/2017 here in the office where debridement was performed.  Patient was advised to continue with oral antibiotics and wearing a postsurgical shoe.  The patient is very anxious today to be released from our care and he would like to go to the wound care center.  He was recommended to go to the wound care center by a friend of his, and he feels that wound care will clear his postsurgical infection and aid in healing his wound dehiscence. Patient also explained to me today that he did get his surgical incision site wet in the shower approximately 6 days postop and that is where he possibly began this postsurgical infection.  He presents today for treatment and evaluation and would like to be referred to the wound care center.  Past Medical History:  Diagnosis Date  . Arthritis   . Coronary artery disease   . Diabetes mellitus without complication ([place])    type2  . History of kidney stones   . HLD (hyperlipidemia)   . Hypertension      Physical Exam: General: The patient is alert and oriented x3 in no acute distress.  Dermatology: Surgical wound dehiscence noted at the Morton's neuroma incision site right foot measuring approximately 1.3 x 0.7 x 1.0 cm (LxWxD).  There is some peri-wound erythema with edema to the area.  Moderate amount of drainage noted.  There is a mild malodor noted.  There is no exposed bone or joint.  There is a moderate amount of necrotic tissue within the dehiscence site.  Vascular: Palpable pedal pulses bilaterally.  Localized edema or erythema noted. Capillary refill within normal limits.  Neurological: Epicritic and protective threshold grossly intact bilaterally.   Musculoskeletal Exam: Range of motion within normal limits to all pedal and ankle joints  bilateral. Muscle strength 5/5 in all groups bilateral.   Assessment: 1.  Status post resection of Morton's neuroma right foot. DOS: 11/13/17 2.  Postsurgical wound dehiscence with cellulitis right foot   Plan of Care:  1. Patient evaluated.  Cultures that were taken on 11/25/2017 were reviewed today.  Dry sterile dressing was applied 2.  Refill prescription for ciprofloxacin 500 mg twice daily based on cultures and sensitivity 3.  The patient has an upcoming appointment at the [facility] wound care center.  Patient is very adamant about being seen at the wound care center.  From my standpoint he is more than welcome to have treatment and management of the wound care center.  Recommend that the patient return to the clinic in 5 days for dressing change after which he can be released to continue management at the [facility] wound care center.      Edrick Kins, DPM [facility]  Dr. Edrick Kins, DPM    [address], [place] [address]                Office [phone]  Fax [phone]

## 2017-12-09 ENCOUNTER — Ambulatory Visit (INDEPENDENT_AMBULATORY_CARE_PROVIDER_SITE_OTHER): Payer: Medicare Other | Admitting: Podiatry

## 2017-12-09 DIAGNOSIS — L03115 Cellulitis of right lower limb: Secondary | ICD-10-CM

## 2017-12-09 DIAGNOSIS — Z9889 Other specified postprocedural states: Secondary | ICD-10-CM

## 2017-12-09 DIAGNOSIS — T8130XA Disruption of wound, unspecified, initial encounter: Secondary | ICD-10-CM

## 2017-12-13 ENCOUNTER — Ambulatory Visit (INDEPENDENT_AMBULATORY_CARE_PROVIDER_SITE_OTHER): Payer: Self-pay

## 2017-12-13 ENCOUNTER — Telehealth: Payer: Self-pay | Admitting: Podiatry

## 2017-12-13 DIAGNOSIS — T8130XA Disruption of wound, unspecified, initial encounter: Secondary | ICD-10-CM

## 2017-12-13 DIAGNOSIS — L03115 Cellulitis of right lower limb: Secondary | ICD-10-CM

## 2017-12-13 NOTE — Telephone Encounter (Signed)
Dr. Delbert Phenix with Wound care center needs letter sent to them that you are releasing patient to their care. Patient is scheduled to be seen Sept 30.

## 2017-12-15 NOTE — Progress Notes (Signed)
   HPI: 72 year old male presents the office today for follow-up evaluation regarding postsurgical infection of a Morton's neuroma resection on 11/13/2017 to the right lower extremity.  Patient presents for dressing change today.  He is still adamant about going to the wound care center for treatment of the wound dehiscence and ulcer site.  Patient was told by a physician friend to have his ulcer cared for, and the patient is very adamant about going to the wound care center.  He presents for follow-up treatment evaluation  Past Medical History:  Diagnosis Date  . Arthritis   . Coronary artery disease   . Diabetes mellitus without complication ([place])    type2  . History of kidney stones   . HLD (hyperlipidemia)   . Hypertension      Physical Exam: General: The patient is alert and oriented x3 in no acute distress.  Dermatology: Surgical wound dehiscence noted at the Morton's neuroma incision site right foot measuring approximately 1.2 x 0.7 x 1.0 cm (LxWxD).  There is improvement noted of the peri-wound erythema with edema to the area.  Moderate amount of drainage noted.  No malodor noted.  There is no exposed bone or joint.  Wound base appears more granular since last visit.  Overall appearance of the wound is improved.  Vascular: Palpable pedal pulses bilaterally.  Localized edema or erythema noted. Capillary refill within normal limits.  Neurological: Epicritic and protective threshold grossly intact bilaterally.   Musculoskeletal Exam: Range of motion within normal limits to all pedal and ankle joints bilateral. Muscle strength 5/5 in all groups bilateral.   Assessment: 1.  Status post resection of Morton's neuroma right foot. DOS: 11/13/17 2.  Postsurgical wound dehiscence with cellulitis right foot   Plan of Care:  1. Patient evaluated.  Excisional debridement of the ulceration site was performed using a tissue nipper. 2.  Continue the 10-day course of Cipro 500 mg twice daily  based on prior cultures and sensitivities 3.  The patient has an upcoming appointment at the [facility] wound care center on 12/16/2017.  Patient is very adamant about being seen at the wound care center.  From my standpoint he is more than welcome to have treatment and management of the wound care center.  As per the patient request he is released from my care and can follow-up with the wound care center for management of the postsurgical infection and dehiscence. 4.  Return to clinic as needed      Edrick Kins, DPM [facility]  Dr. Edrick Kins, DPM    [address], [place] [address]                Office [phone]  Fax [phone]

## 2017-12-16 ENCOUNTER — Encounter: Payer: Medicare Other | Attending: Physician Assistant | Admitting: Physician Assistant

## 2017-12-16 ENCOUNTER — Encounter: Payer: Self-pay | Admitting: *Deleted

## 2017-12-16 DIAGNOSIS — E785 Hyperlipidemia, unspecified: Secondary | ICD-10-CM | POA: Insufficient documentation

## 2017-12-16 DIAGNOSIS — I2510 Atherosclerotic heart disease of native coronary artery without angina pectoris: Secondary | ICD-10-CM | POA: Diagnosis not present

## 2017-12-16 DIAGNOSIS — E11621 Type 2 diabetes mellitus with foot ulcer: Secondary | ICD-10-CM | POA: Insufficient documentation

## 2017-12-16 DIAGNOSIS — T8189XA Other complications of procedures, not elsewhere classified, initial encounter: Secondary | ICD-10-CM | POA: Diagnosis not present

## 2017-12-16 DIAGNOSIS — L97512 Non-pressure chronic ulcer of other part of right foot with fat layer exposed: Secondary | ICD-10-CM | POA: Diagnosis not present

## 2017-12-16 DIAGNOSIS — I10 Essential (primary) hypertension: Secondary | ICD-10-CM | POA: Diagnosis not present

## 2017-12-16 NOTE — Telephone Encounter (Signed)
[place] states letter of release can be sent to her and she will give to Dr. Joaquim Lai.

## 2017-12-16 NOTE — Telephone Encounter (Signed)
Dr. Amalia Hailey states pt may be released to Dr. Aundria Rud care.

## 2017-12-16 NOTE — Progress Notes (Signed)
Patient is here today for dressing change, current post surgical infection of Morton's neuroma resection on 11/13/2017 right lower extremity.  He has no pedal complaints at this time, denies pain.  Surgical wound dehiscence noted and Morton's neuroma incision site right foot.  There is no erythema, no redness, mild swelling, and moderate amount of drainage noted.  There is no malodor at this time.  Overall the area appears to be free of signs and symptoms of infection.  Iodoform was packed in the wound, dry sterile dressing applied.  Patient has a follow-up appointment Monday to be seen at [facility] wound [facility].  He is to follow-up with any questions or concerns.

## 2017-12-16 NOTE — Telephone Encounter (Signed)
Pt states he has an appt today at [facility] and if he is not released by Dr. Amalia Hailey he will have to wait 90 days. I told pt Dr. Amalia Hailey had released him and I needed to know who the letter should be addressed. Pt states [facility].

## 2017-12-16 NOTE — Telephone Encounter (Signed)
Letter of release to [facility] Dr. Joaquim Lai.

## 2017-12-17 NOTE — Progress Notes (Signed)
DELLIS, VOGHT ([id number]) Visit Report for 12/16/2017 Abuse/Suicide Risk Screen Details Patient Name: [name], [name]. Date of Service: 12/16/2017 8:45 AM Medical Record Number: [id number] Patient Account Number: [id number] Date of Birth/Sex: [birth year] (72 y.o. M) Treating RN: Montey Hora Primary Care Kalim Kissel: Yaakov Guthrie Other Clinician: Referring Kalyna Paolella: Yaakov Guthrie Treating Cyntha Brickman/Extender: Melburn Hake, HOYT Weeks in Treatment: 0 Abuse/Suicide Risk Screen Items Answer ABUSE/SUICIDE RISK SCREEN: Has anyone close to you tried to hurt or harm you recentlyo No Do you feel uncomfortable with anyone in your familyo No Has anyone forced you do things that you didnot want to doo No Do you have any thoughts of harming yourselfo No Patient displays signs or symptoms of abuse and/or neglect. No Electronic Signature(s) Signed: 12/16/2017 5:23:17 PM By: Montey Hora Entered By: Montey Hora on 12/16/2017 08:50:12 [place], [name] ([id number]) -------------------------------------------------------------------------------- Activities of Daily Living Details Patient Name: [name]. Date of Service: 12/16/2017 8:45 AM Medical Record Number: [id number] Patient Account Number: [id number] Date of Birth/Sex: [birth year] (72 y.o. M) Treating RN: Montey Hora Primary Care Gwendy Boeder: Yaakov Guthrie Other Clinician: Referring Kurt Azimi: Yaakov Guthrie Treating Kierre Hintz/Extender: Melburn Hake, HOYT Weeks in Treatment: 0 Activities of Daily Living Items Answer Activities of Daily Living (Please select one for each item) Drive Automobile Completely Able Take Medications Completely Able Use Telephone Completely Able Care for Appearance Completely Able Use Toilet Completely Able Bath / Shower Completely Able Dress Self Completely Able Feed Self Completely Able Walk Completely Able Get In / Out Bed Completely Able Housework Completely Able Prepare Meals Completely [place] for Self Completely Able Electronic Signature(s) Signed: 12/16/2017 5:23:17 PM By: Montey Hora Entered By: Montey Hora on 12/16/2017 08:50:34 [place]cell[place], [name] ([id number]) -------------------------------------------------------------------------------- Education Assessment Details Patient Name: [name] Date of Service: 12/16/2017 8:45 AM Medical Record Number: [id number] Patient Account Number: [id number] Date of Birth/Sex: [birth year] (72 y.o. M) Treating RN: Montey Hora Primary Care Jameison Haji: Yaakov Guthrie Other Clinician: Referring Tamira Ryland: Yaakov Guthrie Treating Kristilyn Coltrane/Extender: Melburn Hake, HOYT Weeks in Treatment: 0 Primary Learner Assessed: Patient Learning Preferences/Education Level/Primary Language Learning Preference: Explanation, Demonstration Highest Education Level: College or Above Preferred Language: English Cognitive Barrier Assessment/Beliefs Language Barrier: No Translator Needed: No Memory Deficit: No Emotional Barrier: No Cultural/Religious Beliefs Affecting Medical Care: No Physical Barrier Assessment Impaired Vision: No Impaired Hearing: No Decreased Hand dexterity: No Knowledge/Comprehension Assessment Knowledge Level: Medium Comprehension Level: Medium Ability to understand written Medium instructions: Ability to understand verbal Medium instructions: Motivation Assessment Anxiety Level: Calm Cooperation: Cooperative Education Importance: Acknowledges Need Interest in Health Problems: Asks Questions Perception: Coherent Willingness to Engage in Self- Medium Management Activities: Readiness to Engage in Self- Medium Management Activities: Electronic Signature(s) Signed: 12/16/2017 5:23:17 PM By: Montey Hora Entered By: Montey Hora on 12/16/2017 08:50:58 Bozich, [name] ([id number]) -------------------------------------------------------------------------------- Fall Risk Assessment  Details Patient Name: [name] Date of Service: 12/16/2017 8:45 AM Medical Record Number: [id number] Patient Account Number: [id number] Date of Birth/Sex: [birth year] (72 y.o. M) Treating RN: Montey Hora Primary Care Sala Tague: Yaakov Guthrie Other Clinician: Referring Agapito Hanway: Yaakov Guthrie Treating Omario Ander/Extender: Melburn Hake, HOYT Weeks in Treatment: 0 Fall Risk Assessment Items Have you had 2 or more falls in the last 12 monthso 0 No Have you had any fall that resulted in injury in the last 12 monthso 0 No FALL RISK ASSESSMENT: History of falling - immediate or within 3 months 0 No Secondary diagnosis 0 No Ambulatory aid None/bed rest/wheelchair/nurse 0 Yes Crutches/cane/walker 0 No Furniture  0 No IV Access/Saline Lock 0 No Gait/Training Normal/bed rest/immobile 0 No Weak 10 Yes Impaired 0 No Mental Status Oriented to own ability 0 Yes Electronic Signature(s) Signed: 12/16/2017 5:23:17 PM By: Montey Hora Entered By: Montey Hora on 12/16/2017 08:51:13 [place]es, [name] ([id number]) -------------------------------------------------------------------------------- Foot Assessment Details Patient Name: [name] Date of Service: 12/16/2017 8:45 AM Medical Record Number: [id number] Patient Account Number: [id number] Date of Birth/Sex: 06-06-1945 (72 y.o. M) Treating RN: Montey Hora Primary Care Melquisedec Journey: Yaakov Guthrie Other Clinician: Referring Glorene Leitzke: Yaakov Guthrie Treating Dinero Chavira/Extender: Melburn Hake, HOYT Weeks in Treatment: 0 Foot Assessment Items Site Locations + = Sensation present, - = Sensation absent, C = Callus, U = Ulcer R = Redness, W = Warmth, M = Maceration, PU = Pre-ulcerative lesion F = Fissure, S = Swelling, D = Dryness Assessment Right: Left: Other Deformity: No No Prior Foot Ulcer: No No Prior Amputation: No No Charcot Joint: No No Ambulatory Status: Ambulatory Without Help Gait: Steady Electronic Signature(s) Signed:  12/16/2017 5:23:17 PM By: Montey Hora Entered By: Montey Hora on 12/16/2017 08:55:24 [place]op, [name] ([id number]) -------------------------------------------------------------------------------- Nutrition Risk Assessment Details Patient Name: [name] Date of Service: 12/16/2017 8:45 AM Medical Record Number: [id number] Patient Account Number: [id number] Date of Birth/Sex: Sep 21, 1945 (72 y.o. M) Treating RN: Montey Hora Primary Care Chrisopher Pustejovsky: Yaakov Guthrie Other Clinician: Referring Aadhya Bustamante: Yaakov Guthrie Treating Malaina Mortellaro/Extender: Melburn Hake, HOYT Weeks in Treatment: 0 Height (in): Weight (lbs): Body Mass Index (BMI): Nutrition Risk Assessment Items NUTRITION RISK SCREEN: I have an illness or condition that made me change the kind and/or amount of 0 No food I eat I eat fewer than two meals per day 0 No I eat few fruits and vegetables, or milk products 0 No I have three or more drinks of beer, liquor or wine almost every day 0 No I have tooth or mouth problems that make it hard for me to eat 0 No I don't always have enough money to buy the food I need 0 No I eat alone most of the time 0 No I take three or more different prescribed or over-the-counter drugs a day 1 Yes Without wanting to, I have lost or gained 10 pounds in the last six months 0 No I am not always physically able to shop, cook and/or feed myself 0 No Nutrition Protocols Good Risk Protocol 0 No interventions needed Moderate Risk Protocol Electronic Signature(s) Signed: 12/16/2017 5:23:17 PM By: Montey Hora Entered By: Montey Hora on 12/16/2017 08:51:20

## 2017-12-18 ENCOUNTER — Ambulatory Visit: Payer: Medicare Other | Admitting: Internal Medicine

## 2017-12-18 ENCOUNTER — Other Ambulatory Visit: Payer: Self-pay | Admitting: Physician Assistant

## 2017-12-18 DIAGNOSIS — T8189XA Other complications of procedures, not elsewhere classified, initial encounter: Secondary | ICD-10-CM

## 2017-12-18 NOTE — Progress Notes (Signed)
CAYDIN, YEATTS ([id number]) Visit Report for 12/16/2017 Allergy List Details Patient Name: DEAIRE, MCWHIRTER. Date of Service: 12/16/2017 8:45 AM Medical Record Number: [id number] Patient Account Number: [id number] Date of Birth/Sex: 1945/06/20 (72 y.o. M) Treating RN: Montey Hora Primary Care Zavannah Deblois: Yaakov Guthrie Other Clinician: Referring Kaden Dunkel: Yaakov Guthrie Treating Camera Krienke/Extender: STONE III, HOYT Weeks in Treatment: 0 Allergies Active Allergies No Known Allergies Allergy Notes Electronic Signature(s) Signed: 12/16/2017 5:23:17 PM By: Montey Hora Entered By: Montey Hora on 12/16/2017 08:49:46 [place], [name] ([id number]) -------------------------------------------------------------------------------- Arrival Information Details Patient Name: [name] Date of Service: 12/16/2017 8:45 AM Medical Record Number: [id number] Patient Account Number: [id number] Date of Birth/Sex: 1945-09-28 (72 y.o. M) Treating RN: Roger Shelter Primary Care Anthonia Monger: Yaakov Guthrie Other Clinician: Referring Bronislaw Switzer: Yaakov Guthrie Treating Darcel Zick/Extender: Melburn Hake, HOYT Weeks in Treatment: 0 Visit Information Patient Arrived: Ambulatory Arrival Time: 08:42 Accompanied By: self Transfer Assistance: None Patient Identification Verified: Yes Secondary Verification Process Completed: Yes Patient Has Alerts: Yes Patient Alerts: DMII Electronic Signature(s) Signed: 12/16/2017 5:23:17 PM By: Montey Hora Entered By: Montey Hora on 12/16/2017 08:49:57 Kantor, [name] ([id number]) -------------------------------------------------------------------------------- Clinic Level of Care Assessment Details Patient Name: [name] Date of Service: 12/16/2017 8:45 AM Medical Record Number: [id number] Patient Account Number: [id number] Date of Birth/Sex: 1945/05/23 (72 y.o. M) Treating RN: Roger Shelter Primary Care Ankita Newcomer: Yaakov Guthrie Other Clinician: Referring  Gearl Baratta: Yaakov Guthrie Treating [employer]/Extender: Melburn Hake, HOYT Weeks in Treatment: 0 Clinic Level of Care Assessment Items TOOL 1 Quantity Score []  - Use when EandM and Procedure is performed on INITIAL visit 0 ASSESSMENTS - Nursing Assessment / Reassessment X - General Physical Exam (combine w/ comprehensive assessment (listed just below) when 1 20 performed on new pt. evals) X- 1 25 Comprehensive Assessment (HX, ROS, Risk Assessments, Wounds Hx, etc.) ASSESSMENTS - Wound and Skin Assessment / Reassessment []  - Dermatologic / Skin Assessment (not related to wound area) 0 ASSESSMENTS - Ostomy and/or Continence Assessment and Care []  - Incontinence Assessment and Management 0 []  - 0 Ostomy Care Assessment and Management (repouching, etc.) PROCESS - Coordination of Care X - Simple Patient / Family Education for ongoing care 1 15 []  - 0 Complex (extensive) Patient / Family Education for ongoing care []  - 0 Staff obtains Programmer, systems, Records, Test Results / Process Orders []  - 0 Staff telephones HHA, Nursing Homes / Clarify orders / etc []  - 0 Routine Transfer to another Facility (non-emergent condition) []  - 0 Routine Hospital Admission (non-emergent condition) []  - 0 New Admissions / Biomedical engineer / Ordering NPWT, Apligraf, etc. []  - 0 Emergency Hospital Admission (emergent condition) PROCESS - Special Needs []  - Pediatric / Minor Patient Management 0 []  - 0 Isolation Patient Management []  - 0 Hearing / Language / Visual special needs []  - 0 Assessment of Community assistance (transportation, D/C planning, etc.) []  - 0 Additional assistance / Altered mentation []  - 0 Support Surface(s) Assessment (bed, cushion, seat, etc.) Stanger, Nilay G. ([id number]) INTERVENTIONS - Miscellaneous []  - External ear exam 0 []  - 0 Patient Transfer (multiple staff / Civil Service fast streamer / Similar devices) []  - 0 Simple Staple / Suture removal (25 or less) []  - 0 Complex Staple /  Suture removal (26 or more) []  - 0 Hypo/Hyperglycemic Management (do not check if billed separately) X- 1 15 Ankle / Brachial Index (ABI) - do not check if billed separately Has the patient been seen at the hospital within the last three years: Yes Total Score: 75 Level  Of Care: New/Established - Level 2 Electronic Signature(s) Signed: 12/16/2017 4:45:54 PM By: Roger Shelter Entered By: Roger Shelter on 12/16/2017 09:43:24 Tawil, [name] ([id number]) -------------------------------------------------------------------------------- Encounter Discharge Information Details Patient Name: [name] Date of Service: 12/16/2017 8:45 AM Medical Record Number: [id number] Patient Account Number: [id number] Date of Birth/Sex: [birth year] (72 y.o. M) Treating RN: Montey Hora Primary Care Dewanda Fennema: Yaakov Guthrie Other Clinician: Referring Lanny Lipkin: Yaakov Guthrie Treating Marjorie Lussier/Extender: Melburn Hake, HOYT Weeks in Treatment: 0 Encounter Discharge Information Items Discharge Condition: Stable Ambulatory Status: Ambulatory Discharge Destination: Home Transportation: Private Auto Accompanied By: self Schedule Follow-up Appointment: Yes Clinical Summary of Care: Post Procedure Vitals: Temperature (F): 97.9 Pulse (bpm): 57 Respiratory Rate (breaths/min): 16 Blood Pressure (mmHg): 125/100 Electronic Signature(s) Signed: 12/16/2017 5:23:17 PM By: Montey Hora Entered By: Montey Hora on 12/16/2017 09:59:49 Ledee, [name] ([id number]) -------------------------------------------------------------------------------- Lower Extremity Assessment Details Patient Name: [name] Date of Service: 12/16/2017 8:45 AM Medical Record Number: [id number] Patient Account Number: [id number] Date of Birth/Sex: 10-19-1945 (72 y.o. M) Treating RN: Montey Hora Primary Care Cheryn Lundquist: Yaakov Guthrie Other Clinician: Referring Diya Gervasi: Yaakov Guthrie Treating Pope Brunty/Extender: Melburn Hake,  HOYT Weeks in Treatment: 0 Edema Assessment Assessed: [Left: No] [Right: No] [Left: Edema] [Right: :] Calf Left: Right: Point of Measurement: 40 cm From Medial Instep 37.2 cm 37.3 cm Ankle Left: Right: Point of Measurement: 13 cm From Medial Instep 24.1 cm 26 cm Vascular Assessment Pulses: Dorsalis Pedis Palpable: [Left:Yes] [Right:Yes] Doppler Audible: [Left:Yes] [Right:Yes] Posterior Tibial Palpable: [Left:Yes] [Right:Yes] Doppler Audible: [Left:Yes] [Right:Yes] Extremity colors, hair growth, and conditions: Extremity Color: [Left:Normal] [Right:Normal] Hair Growth on Extremity: [Left:Yes] [Right:Yes] Temperature of Extremity: [Left:Warm] [Right:Warm] Capillary Refill: [Left:< 3 seconds] [Right:< 3 seconds] Blood Pressure: Brachial: [Left:120] Dorsalis Pedis: 196 [Left:Dorsalis Pedis: 192] Ankle: Posterior Tibial: 226 [Left:Posterior Tibial: 222 1.88] [Right:1.85] Toe Nail Assessment Left: Right: Thick: Yes Yes Discolored: Yes Yes Deformed: No No Improper Length and Hygiene: No No Electronic Signature(s) Signed: 12/16/2017 5:23:17 PM By: Montey Hora Entered By: Montey Hora on 12/16/2017 09:14:29 Abrahamsen, [name] ([id number]) [place], [place] ([id number]) -------------------------------------------------------------------------------- Multi Wound Chart Details Patient Name: [name] Date of Service: 12/16/2017 8:45 AM Medical Record Number: [id number] Patient Account Number: [id number] Date of Birth/Sex: 01-25-1946 (72 y.o. M) Treating RN: Roger Shelter Primary Care Lynette Topete: Yaakov Guthrie Other Clinician: Referring Khaila Velarde: Yaakov Guthrie Treating Sawsan Riggio/Extender: Melburn Hake, HOYT Weeks in Treatment: 0 Vital Signs Height(in): Pulse(bpm): 14 Weight(lbs): Blood Pressure(mmHg): 125/100 Body Mass Index(BMI): Temperature(F): 97.9 Respiratory Rate 16 (breaths/min): Photos: [N/A:N/A] Wound Location: Right Foot - Dorsal N/A N/A Wounding Event:  Surgical Injury N/A N/A Primary Etiology: Diabetic Wound/Ulcer of the N/A N/A Lower Extremity Secondary Etiology: Open Surgical Wound N/A N/A Comorbid History: Coronary Artery Disease, N/A N/A Hypertension, Type II Diabetes Date Acquired: 10/30/2017 N/A N/A Weeks of Treatment: 0 N/A N/A Wound Status: Open N/A N/A Measurements L x W x D 1.8x0.4x1.6 N/A N/A (cm) Area (cm) : 0.565 N/A N/A Volume (cm) : 0.905 N/A N/A Classification: Grade 1 N/A N/A Exudate Amount: Large N/A N/A Exudate Type: Serous N/A N/A Exudate Color: amber N/A N/A Wound Margin: Flat and Intact N/A N/A Granulation Amount: Small (1-33%) N/A N/A Granulation Quality: Pink N/A N/A Necrotic Amount: Large (67-100%) N/A N/A Exposed Structures: Fat Layer (Subcutaneous N/A N/A Tissue) Exposed: Yes Fascia: No Tendon: No Muscle: No Joint: No Bone: No Goertzen, Shivansh G. ([id number]) Epithelialization: None N/A N/A Periwound Skin Texture: Excoriation: No N/A N/A Induration: No Callus: No Crepitus: No Rash: No Scarring: No  Periwound Skin Moisture: Maceration: No N/A N/A Dry/Scaly: No Periwound Skin Color: Erythema: Yes N/A N/A Atrophie Blanche: No Cyanosis: No Ecchymosis: No Hemosiderin Staining: No Mottled: No Pallor: No Rubor: No Erythema Location: Circumferential N/A N/A Temperature: No Abnormality N/A N/A Tenderness on Palpation: No N/A N/A Wound Preparation: Ulcer Cleansing: N/A N/A Rinsed/Irrigated with Saline Topical Anesthetic Applied: Other: lidocaine 4% Treatment Notes Electronic Signature(s) Signed: 12/16/2017 4:45:54 PM By: Roger Shelter Entered By: Roger Shelter on 12/16/2017 09:30:06 QUARAN, KEDZIERSKI ([id number]) -------------------------------------------------------------------------------- [place] Details Patient Name: [name] Date of Service: 12/16/2017 8:45 AM Medical Record Number: [id number] Patient Account Number: [id number] Date of Birth/Sex:  01/27/1946 (72 y.o. M) Treating RN: Roger Shelter Primary Care Grayland Daisey: Yaakov Guthrie Other Clinician: Referring Jesusa Stenerson: Yaakov Guthrie Treating Beverly Ferner/Extender: Melburn Hake, HOYT Weeks in Treatment: 0 Active Inactive ` Orientation to the Wound Care Program Nursing Diagnoses: Knowledge deficit related to the wound healing center program Goals: Patient/caregiver will verbalize understanding of the [facility] Program Date Initiated: 12/16/2017 Target Resolution Date: 01/06/2018 Goal Status: Active Interventions: Provide education on orientation to the wound center Notes: ` Wound/Skin Impairment Nursing Diagnoses: Impaired tissue integrity Goals: Patient/caregiver will verbalize understanding of skin care regimen Date Initiated: 12/16/2017 Target Resolution Date: 01/12/2018 Goal Status: Active Ulcer/skin breakdown will have a volume reduction of 30% by week 4 Date Initiated: 12/16/2017 Target Resolution Date: 01/12/2018 Goal Status: Active Interventions: Assess patient/caregiver ability to obtain necessary supplies Assess patient/caregiver ability to perform ulcer/skin care regimen upon admission and as needed Assess ulceration(s) every visit Treatment Activities: Skin care regimen initiated : 12/16/2017 Notes: Electronic Signature(s) Signed: 12/16/2017 4:45:54 PM By: Aaron Mose ([id number]) Entered By: Roger Shelter on 12/16/2017 09:29:47 HOLGER, SOKOLOWSKI ([id number]) -------------------------------------------------------------------------------- Pain Assessment Details Patient Name: [name] Date of Service: 12/16/2017 8:45 AM Medical Record Number: [id number] Patient Account Number: [id number] Date of Birth/Sex: Sep 28, 1945 (72 y.o. M) Treating RN: Roger Shelter Primary Care Brittley Regner: Yaakov Guthrie Other Clinician: Referring Deyana Wnuk: Yaakov Guthrie Treating Yeng Perz/Extender: Melburn Hake, HOYT Weeks in Treatment: 0 Active  Problems Location of Pain Severity and Description of Pain Patient Has Paino No Site Locations Pain Management and Medication Current Pain Management: Electronic Signature(s) Signed: 12/16/2017 1:08:03 PM By: Lorine Bears RCP, RRT, CHT Signed: 12/16/2017 4:45:54 PM By: Roger Shelter Entered By: Lorine Bears on 12/16/2017 08:43:30 Rocque, [name] ([id number]) -------------------------------------------------------------------------------- Patient/Caregiver Education Details Patient Name: [name] Date of Service: 12/16/2017 8:45 AM Medical Record Number: [id number] Patient Account Number: [id number] Date of Birth/Gender: Apr 01, 1945 (72 y.o. M) Treating RN: Roger Shelter Primary Care Physician: Yaakov Guthrie Other Clinician: Referring Physician: Yaakov Guthrie Treating Physician/Extender: Sharalyn Ink in Treatment: 0 Education Assessment Education Provided To: Patient Education Topics Provided Wound Debridement: Handouts: Wound Debridement Methods: Explain/Verbal Responses: State content correctly Wound/Skin Impairment: Handouts: Caring for Your Ulcer Methods: Explain/Verbal Responses: State content correctly Electronic Signature(s) Signed: 12/16/2017 4:45:54 PM By: Roger Shelter Entered By: Roger Shelter on 12/16/2017 09:44:28 Dang, [name] ([id number]) -------------------------------------------------------------------------------- Wound Assessment Details Patient Name: [name] Date of Service: 12/16/2017 8:45 AM Medical Record Number: [id number] Patient Account Number: [id number] Date of Birth/Sex: 09-07-1945 (72 y.o. M) Treating RN: Montey Hora Primary Care Eion Timbrook: Yaakov Guthrie Other Clinician: Referring Karmin Kasprzak: Yaakov Guthrie Treating Taeden Geller/Extender: STONE III, HOYT Weeks in Treatment: 0 Wound Status Wound Number: 1 Primary Etiology: Diabetic Wound/Ulcer of the Lower Extremity Wound  Location: Right Foot - Dorsal Secondary Open Surgical Wound Wounding Event: Surgical Injury Etiology: Date  Acquired: 10/30/2017 Wound Status: Open Weeks Of Treatment: 0 Comorbid Coronary Artery Disease, Hypertension, Clustered Wound: No History: Type II Diabetes Photos Wound Measurements Length: (cm) 1.8 % Reduction in Width: (cm) 0.4 % Reduction in Depth: (cm) 1.6 Epithelializat Area: (cm) 0.565 Tunneling: Volume: (cm) 0.905 Undermining: Area: 0% Volume: 0% ion: None No No Wound Description Classification: Grade 1 Foul Odor Afte Wound Margin: Flat and Intact Slough/Fibrino Exudate Amount: Medium Exudate Type: Serous Exudate Color: amber r Cleansing: No Yes Wound Bed Granulation Amount: Small (1-33%) Exposed Structure Granulation Quality: Pink Fascia Exposed: No Necrotic Amount: Large (67-100%) Fat Layer (Subcutaneous Tissue) Exposed: Yes Necrotic Quality: Adherent Slough Tendon Exposed: No Muscle Exposed: No Joint Exposed: No Bone Exposed: No Periwound Skin Texture Texture Color Dsouza, Jamorris G. ([id number]) No Abnormalities Noted: No No Abnormalities Noted: No Callus: No Atrophie Blanche: No Crepitus: No Cyanosis: No Excoriation: No Ecchymosis: No Induration: No Erythema: Yes Rash: No Erythema Location: Circumferential Scarring: No Hemosiderin Staining: No Mottled: No Moisture Pallor: No No Abnormalities Noted: No Rubor: No Dry / Scaly: No Maceration: No Temperature / Pain Temperature: No Abnormality Wound Preparation Ulcer Cleansing: Rinsed/Irrigated with Saline Topical Anesthetic Applied: Other: lidocaine 4%, Treatment Notes Wound #1 (Right, Dorsal Foot) 1. Cleansed with: Clean wound with Normal Saline 2. Anesthetic Topical Lidocaine 4% cream to wound bed prior to debridement 3. Peri-wound Care: Skin Prep 4. Dressing Applied: Calcium Alginate with Silver Prisma Ag 5. Secondary Dressing Applied Bordered Foam Dressing Electronic  Signature(s) Signed: 12/16/2017 5:23:17 PM By: Montey Hora Entered By: Montey Hora on 12/16/2017 13:09:15 [place], [name] ([id number]) -------------------------------------------------------------------------------- [place] Details Patient Name: [name] Date of Service: 12/16/2017 8:45 AM Medical Record Number: [id number] Patient Account Number: [id number] Date of Birth/Sex: [birth year] (72 y.o. M) Treating RN: Roger Shelter Primary Care Matthias Bogus: Yaakov Guthrie Other Clinician: Referring Gwenivere Hiraldo: Yaakov Guthrie Treating Francile Woolford/Extender: Melburn Hake, HOYT Weeks in Treatment: 0 Vital Signs Time Taken: 08:43 Temperature (F): 97.9 Pulse (bpm): 57 Respiratory Rate (breaths/min): 16 Blood Pressure (mmHg): 125/100 Reference Range: 80 - 120 mg / dl Electronic Signature(s) Signed: 12/16/2017 1:08:03 PM By: Lorine Bears RCP, RRT, CHT Entered By: Lorine Bears on 12/16/2017 08:47:30

## 2017-12-18 NOTE — Progress Notes (Signed)
AWAB, ABEBE ([id number]) Visit Report for 12/16/2017 Chief Complaint Document Details Patient Name: [name], [name]. Date of Service: 12/16/2017 8:45 AM Medical Record Number: [id number] Patient Account Number: [id number] Date of Birth/Sex: 1945/04/19 (72 y.o. M) Treating RN: Roger Shelter Primary Care Provider: Yaakov Guthrie Other Clinician: Referring Provider: Yaakov Guthrie Treating Provider/Extender: Melburn Hake,  Weeks in Treatment: 0 Information Obtained from: Patient Chief Complaint Right foot surgical wound Electronic Signature(s) Signed: 12/16/2017 11:58:42 PM By: Worthy Keeler PA-C Entered By: Worthy Keeler on 12/16/2017 09:25:51 Schaber, [name] ([id number]) -------------------------------------------------------------------------------- Debridement Details Patient Name: [name] Date of Service: 12/16/2017 8:45 AM Medical Record Number: [id number] Patient Account Number: [id number] Date of Birth/Sex: 05/27/1945 (72 y.o. M) Treating RN: Roger Shelter Primary Care Provider: Yaakov Guthrie Other Clinician: Referring Provider: Yaakov Guthrie Treating Provider/Extender: Melburn Hake,  Weeks in Treatment: 0 Debridement Performed for Wound #1 Right,Dorsal Foot Assessment: Performed By: Physician STONE III,  E., PA-C Debridement Type: Debridement Severity of Tissue Pre Fat layer exposed Debridement: Level of Consciousness (Pre- Awake and Alert procedure): Pre-procedure Verification/Time Yes - 09:37 Out Taken: Start Time: 09:37 Pain Control: Other : lidocaine 4% Total Area Debrided (L x W): 1.8 (cm) x 0.4 (cm) = 0.72 (cm) Tissue and other material Viable, Non-Viable, Slough, Subcutaneous, Biofilm, Slough debrided: Level: Skin/Subcutaneous Tissue Debridement Description: Excisional Instrument: Curette Bleeding: Minimum Hemostasis Achieved: Silver Nitrate End Time: 09:39 Procedural Pain: 0 Post Procedural Pain: 0 Response to Treatment:  Procedure was tolerated well Level of Consciousness Awake and Alert (Post-procedure): Post Debridement Measurements of Total Wound Length: (cm) 1.8 Width: (cm) 0.4 Depth: (cm) 1.7 Volume: (cm) 0.961 Character of Wound/Ulcer Post Debridement: Stable Severity of Tissue Post Debridement: Fat layer exposed Post Procedure Diagnosis Same as Pre-procedure Electronic Signature(s) Signed: 12/16/2017 4:45:54 PM By: Roger Shelter Signed: 12/16/2017 11:58:42 PM By: Worthy Keeler PA-C Entered By: Roger Shelter on 12/16/2017 09:43:07 [name], [name] ([id number]) -------------------------------------------------------------------------------- HPI Details Patient Name: [name] Date of Service: 12/16/2017 8:45 AM Medical Record Number: [id number] Patient Account Number: [id number] Date of Birth/Sex: [birth year] (72 y.o. M) Treating RN: Roger Shelter Primary Care Provider: Yaakov Guthrie Other Clinician: Referring Provider: Yaakov Guthrie Treating Provider/Extender: Melburn Hake,  Weeks in Treatment: 0 History of Present Illness HPI Description: 12/16/17 on evaluation today patient presents concerning an issue that he had which began roughly 2 years ago which included pain with walking and having had injections previously along with inserts which he obtained from the [facility]. Unfortunately throughout all this he was not getting significantly. Therefore he did present to Dr. 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 Treatment options were discussed included conservative versus surgical. The patient proceed with surgical intervention at that time. Subsequently the patient ended up undergoing surgery apparently on 10/31/17 although I do not see the actual surgical note when I look for epic. Subsequently he was seen back for reevaluation on 11/06/17 and since that time has been undergoing dressing changes  at Dr. Amalia Hailey office. With that being said wound care has been actually performed in their office on a regular basis since then he's been going in getting the dressings changed and rewrap and subsequently he had a significant issue where this became inflamed/infected and he was placed on Cipro 500 mg. He actually is finishing that up today and in fact the foot itself actually appears to be doing well as far as I'm concerned. Nonetheless  they have been performing good wound care according to what I'm seeing in the clinic where they were performing all of his dressing changes. Subsequently that has been from October 31, 2017 through current when I evaluated him today. The patient's wound culture which was obtained and completed did show Staphylococcus aureus and E. coli both sensitive to Cipro which is why this was utilize. The patient today states that he really wants to try to get this thing closed as quickly as possible. Patient has a history of diabetes mellitus type II with a hemoglobin A1c on 10/15/17 of 8.4. He also has a history of hypertension which is fairly well controlled. Electronic Signature(s) Signed: 12/17/2017 9:56:08 AM By: Worthy Keeler PA-C Entered By: Worthy Keeler on 12/17/2017 08:12:47 [name], [name] ([id number]) -------------------------------------------------------------------------------- Physical Exam Details Patient Name: [name], [name] Date of Service: 12/16/2017 8:45 AM Medical Record Number: [id number] Patient Account Number: [id number] Date of Birth/Sex: 1945/09/27 (72 y.o. M) Treating RN: Roger Shelter Primary Care Provider: Yaakov Guthrie Other Clinician: Referring Provider: Yaakov Guthrie Treating Provider/Extender: Melburn Hake,  Weeks in Treatment: 0 Constitutional sitting or standing blood pressure is within target range for patient.. pulse regular and within target range for patient.Marland Kitchen respirations regular, non-labored and within target range for  patient.Marland Kitchen temperature within target range for patient.. Well- nourished and well-hydrated in no acute distress. Eyes conjunctiva clear no eyelid edema noted. pupils equal round and reactive to light and accommodation. Ears, Nose, Mouth, and Throat no gross abnormality of ear auricles or external auditory canals. normal hearing noted during conversation. mucus membranes moist. Respiratory normal breathing without difficulty. clear to auscultation bilaterally. Cardiovascular regular rate and rhythm with normal S1, S2. 2+ dorsalis pedis/posterior tibialis pulses. no clubbing, cyanosis, significant edema, <3 sec cap refill. Gastrointestinal (GI) soft, non-tender, non-distended, +BS. no ventral hernia noted. Musculoskeletal normal gait and posture. no significant deformity or arthritic changes, no loss or range of motion, no clubbing. Psychiatric this patient is able to make decisions and demonstrates good insight into disease process. Alert and Oriented x 3. pleasant and cooperative. Notes Currently on evaluation today patient's wound bed does not seem to show any evidence of infection at this time which is good news. He's been tolerating the dressing changes without complication which is also good news. Nonetheless I do think we may be able to do something better for him as far as trying to get this area healed as quickly as possible. Concern is Artie been through about a month and a half of wound care under the direction of Dr. Daylene Katayama I'm hopeful that we may be able to get some advanced modalities including possibly a skin substitute approved for him sooner rather than later. Unfortunately is a be as were noncompressible so we are going to need this point to send him for further testing in this regard. Just to confirm that we have good arterial flow. Electronic Signature(s) Signed: 12/17/2017 9:56:08 AM By: Worthy Keeler PA-C Entered By: Worthy Keeler on 12/17/2017  08:18:21 [name], [name] ([id number]) -------------------------------------------------------------------------------- Physician Orders Details Patient Name: [name] Date of Service: 12/16/2017 8:45 AM Medical Record Number: [id number] Patient Account Number: [id number] Date of Birth/Sex: May 01, 1945 (72 y.o. M) Treating RN: Roger Shelter Primary Care Provider: Yaakov Guthrie Other Clinician: Referring Provider: Yaakov Guthrie Treating Provider/Extender: Melburn Hake,  Weeks in Treatment: 0 Verbal / Phone Orders: No Diagnosis Coding ICD-10 Coding Code Description T81.31XA Disruption of external operation (surgical) wound, not elsewhere classified, initial encounter  E11.621 Type 2 diabetes mellitus with foot ulcer L97.512 Non-pressure chronic ulcer of other part of right foot with fat layer exposed I10 Essential (primary) hypertension Wound Cleansing Wound #1 Right,Dorsal Foot o Clean wound with Normal Saline. Anesthetic (add to Medication List) Wound #1 Right,Dorsal Foot o Topical Lidocaine 4% cream applied to wound bed prior to debridement (In Clinic Only). Primary Wound Dressing Wound #1 Right,Dorsal Foot o Silver Alginate - cover prisma ag with sivercell o Silver Collagen - pack into wound Secondary Dressing Wound #1 Right,Dorsal Foot o Boardered Foam Dressing Dressing Change Frequency Wound #1 Right,Dorsal Foot o Change dressing every other day. Follow-up Appointments Wound #1 Right,Dorsal Foot o Return Appointment in 1 week. Services and Therapies o Ankle Brachial Index (ABI) - referral to [facility] for ABI in lower extremities bilateral Notes will submit insurance authorization for [employer] pl Electronic Signature(s) Signed: 12/16/2017 4:45:54 PM By: Aaron [name] ([id number]) Signed: 12/16/2017 11:58:42 PM By: Worthy Keeler PA-C Entered By: Roger Shelter on 12/16/2017 09:42:46 Liskey, [name]  ([id number]) -------------------------------------------------------------------------------- Problem List Details Patient Name: [name], [name]. Date of Service: 12/16/2017 8:45 AM Medical Record Number: [id number] Patient Account Number: [id number] Date of Birth/Sex: 12-02-1945 (72 y.o. M) Treating RN: Roger Shelter Primary Care Provider: Yaakov Guthrie Other Clinician: Referring Provider: Yaakov Guthrie Treating Provider/Extender: Melburn Hake,  Weeks in Treatment: 0 Active Problems ICD-10 Evaluated Encounter Code Description Active Date Today Diagnosis T81.31XA Disruption of external operation (surgical) wound, not 12/16/2017 No Yes elsewhere classified, initial encounter E11.621 Type 2 diabetes mellitus with foot ulcer 12/16/2017 No Yes L97.512 Non-pressure chronic ulcer of other part of right foot with fat 12/16/2017 No Yes layer exposed I10 Essential (primary) hypertension 12/16/2017 No Yes Inactive Problems Resolved Problems Electronic Signature(s) Signed: 12/16/2017 11:58:42 PM By: Worthy Keeler PA-C Entered By: Worthy Keeler on 12/16/2017 09:25:22 Seder, [name] ([id number]) -------------------------------------------------------------------------------- Progress Note Details Patient Name: [name] Date of Service: 12/16/2017 8:45 AM Medical Record Number: [id number] Patient Account Number: [id number] Date of Birth/Sex: 10-05-1945 (72 y.o. M) Treating RN: Roger Shelter Primary Care Provider: Yaakov Guthrie Other Clinician: Referring Provider: Yaakov Guthrie Treating Provider/Extender: Melburn Hake,  Weeks in Treatment: 0 Subjective Chief Complaint Information obtained from Patient Right foot surgical wound History of Present Illness (HPI) 12/16/17 on evaluation today patient presents concerning an issue that he had which began roughly 2 years ago which included pain with walking and having had injections previously along with inserts which he obtained from  the [facility]. Unfortunately throughout all this he was not getting significantly. Therefore he did present to Dr. Daylene Katayama who is a local podiatrist who diagnosed him with a Morton s Neuroma of the second interspace right foot. Treatment options were discussed included conservative versus surgical. The patient proceed with surgical intervention at that time. Subsequently the patient ended up undergoing surgery apparently on 10/31/17 although I do not see the actual surgical note when I look for epic. Subsequently he was seen back for reevaluation on 11/06/17 and since that time has been undergoing dressing changes at Dr. Amalia Hailey office. With that being said wound care has been actually performed in their office on a regular basis since then he's been going in getting the dressings changed and rewrap and subsequently he had a significant issue where this became inflamed/infected and he was placed on Cipro 500 mg. He actually is finishing that up today and in fact the foot itself actually appears to be doing well as far  as I'm concerned. Nonetheless they have been performing good wound care according to what I'm seeing in the clinic where they were performing all of his dressing changes. Subsequently that has been from October 31, 2017 through current when I evaluated him today. The patient's wound culture which was obtained and completed did show Staphylococcus aureus and E. coli both sensitive to Cipro which is why this was utilize. The patient today states that he really wants to try to get this thing closed as quickly as possible. Patient has a history of diabetes mellitus type II with a hemoglobin A1c on 10/15/17 of 8.4. He also has a history of hypertension which is fairly well controlled. Wound History Patient presents with 1 open wound that has been present for approximately 6 weeks. Patient has been treating wound in the following manner: bandage by podiatry. Laboratory tests have not  been performed in the last month. Patient reportedly has not tested positive for an antibiotic resistant organism. Patient reportedly has not tested positive for osteomyelitis. Patient reportedly has not had testing performed to evaluate circulation in the legs. Patient History Information obtained from Patient. Allergies No Known Allergies Family History Cancer - Mother,Father, Diabetes - Paternal Grandparents, Lung Disease - Mother, Stroke - Father, No family history of Heart Disease, Hereditary Spherocytosis, Hypertension, Kidney Disease, Seizures, Thyroid Problems, Tuberculosis. Social History Never smoker, Marital Status - Married, Alcohol Use - Never, Drug Use - No History, Caffeine Use - Moderate. Medical History Eyes WHITFIELD, DULAY ([id number]) Denies history of Cataracts, Glaucoma, Optic Neuritis Ear/Nose/Mouth/Throat Denies history of Chronic sinus problems/congestion, Middle ear problems Hematologic/Lymphatic Denies history of Anemia, Hemophilia, Human Immunodeficiency Virus, Lymphedema, Sickle Cell Disease Respiratory Denies history of Aspiration, Asthma, Chronic Obstructive Pulmonary Disease (COPD), Pneumothorax, Sleep Apnea, Tuberculosis Cardiovascular Patient has history of Coronary Artery Disease, Hypertension Denies history of Angina, Arrhythmia, Congestive Heart Failure, Deep Vein Thrombosis, Hypotension, Myocardial Infarction, Peripheral Arterial Disease, Peripheral Venous Disease, Phlebitis, Vasculitis Gastrointestinal Denies history of Cirrhosis , Colitis, Crohn s, Hepatitis A, Hepatitis B, Hepatitis C Endocrine Patient has history of Type II Diabetes Denies history of Type I Diabetes Genitourinary Denies history of End Stage Renal Disease Immunological Denies history of Lupus Erythematosus, Raynaud s, Scleroderma Integumentary (Skin) Denies history of History of Burn, History of pressure wounds Musculoskeletal Denies history of Gout, Rheumatoid  Arthritis, Osteoarthritis, Osteomyelitis Neurologic Denies history of Dementia, Neuropathy, Quadriplegia, Paraplegia, Seizure Disorder Oncologic Denies history of Received Chemotherapy, Received Radiation Psychiatric Denies history of Anorexia/bulimia, Confinement Anxiety Patient is treated with Oral Agents. Medical And Surgical History Notes Cardiovascular HLD Review of Systems (ROS) Constitutional Symptoms (General Health) Denies complaints or symptoms of Fatigue, Fever, Chills, Marked Weight Change. Eyes Complains or has symptoms of Glasses / Contacts - glasses. Denies complaints or symptoms of Dry Eyes, Vision Changes. Ear/Nose/Mouth/Throat Denies complaints or symptoms of Difficult clearing ears, Sinusitis. Hematologic/Lymphatic Denies complaints or symptoms of Bleeding / Clotting Disorders, Human Immunodeficiency Virus. Respiratory Denies complaints or symptoms of Chronic or frequent coughs, Shortness of Breath. Cardiovascular Complains or has symptoms of LE edema. Denies complaints or symptoms of Chest pain. Gastrointestinal Denies complaints or symptoms of Frequent diarrhea, Nausea, Vomiting. Endocrine Denies complaints or symptoms of Hepatitis, Thyroid disease, Polydypsia (Excessive Thirst). Genitourinary Denies complaints or symptoms of Kidney failure/ Dialysis, Incontinence/dribbling. Immunological CHEICK, [name] ([id number]) Denies complaints or symptoms of Hives, Itching. Integumentary (Skin) Complains or has symptoms of Wounds, Swelling. Denies complaints or symptoms of Bleeding or bruising tendency, Breakdown. Musculoskeletal Denies complaints or symptoms of Muscle Pain,  Muscle Weakness. Neurologic Denies complaints or symptoms of Numbness/parasthesias, Focal/Weakness. Psychiatric Denies complaints or symptoms of Anxiety, Claustrophobia. Objective Constitutional sitting or standing blood pressure is within target range for patient.. pulse regular and  within target range for patient.Marland Kitchen respirations regular, non-labored and within target range for patient.Marland Kitchen temperature within target range for patient.. Well- nourished and well-hydrated in no acute distress. Vitals Time Taken: 8:43 AM, Temperature: 97.9 F, Pulse: 57 bpm, Respiratory Rate: 16 breaths/min, Blood Pressure: 125/100 mmHg. Eyes conjunctiva clear no eyelid edema noted. pupils equal round and reactive to light and accommodation. Ears, Nose, Mouth, and Throat no gross abnormality of ear auricles or external auditory canals. normal hearing noted during conversation. mucus membranes moist. Respiratory normal breathing without difficulty. clear to auscultation bilaterally. Cardiovascular regular rate and rhythm with normal S1, S2. 2+ dorsalis pedis/posterior tibialis pulses. no clubbing, cyanosis, significant edema, Gastrointestinal (GI) soft, non-tender, non-distended, +BS. no ventral hernia noted. Musculoskeletal normal gait and posture. no significant deformity or arthritic changes, no loss or range of motion, no clubbing. Psychiatric this patient is able to make decisions and demonstrates good insight into disease process. Alert and Oriented x 3. pleasant and cooperative. General Notes: Currently on evaluation today patient's wound bed does not seem to show any evidence of infection at this time which is good news. He's been tolerating the dressing changes without complication which is also good news. Nonetheless I do think we may be able to do something better for him as far as trying to get this area healed as quickly as possible. Concern is Artie been through about a month and a half of wound care under the direction of Dr. Daylene Katayama I'm hopeful that we may be able to get some advanced modalities including possibly a skin substitute approved for him sooner [name], [name]. ([id number]) rather than later. Unfortunately is a be as were noncompressible so we are going to need  this point to send him for further testing in this regard. Just to confirm that we have good arterial flow. Integumentary (Hair, Skin) Wound #1 status is Open. Original cause of wound was Surgical Injury. The wound is located on the Right,Dorsal Foot. The wound measures 1.8cm length x 0.4cm width x 1.6cm depth; 0.565cm^2 area and 0.905cm^3 volume. There is Fat Layer (Subcutaneous Tissue) Exposed exposed. There is no tunneling or undermining noted. There is a medium amount of serous drainage noted. The wound margin is flat and intact. There is small (1-33%) pink granulation within the wound bed. There is a large (67-100%) amount of necrotic tissue within the wound bed including Adherent Slough. The periwound skin appearance exhibited: Erythema. The periwound skin appearance did not exhibit: Callus, Crepitus, Excoriation, Induration, Rash, Scarring, Dry/Scaly, Maceration, Atrophie Blanche, Cyanosis, Ecchymosis, Hemosiderin Staining, Mottled, Pallor, Rubor. The surrounding wound skin color is noted with erythema which is circumferential. Periwound temperature was noted as No Abnormality. Assessment Active Problems ICD-10 Disruption of external operation (surgical) wound, not elsewhere classified, initial encounter Type 2 diabetes mellitus with foot ulcer Non-pressure chronic ulcer of other part of right foot with fat layer exposed Essential (primary) hypertension Procedures Wound #1 Pre-procedure diagnosis of Wound #1 is a Diabetic Wound/Ulcer of the Lower Extremity located on the Right,Dorsal Foot .Severity of Tissue Pre Debridement is: Fat layer exposed. There was a Excisional Skin/Subcutaneous Tissue Debridement with a total area of 0.72 sq cm performed by STONE III,  E., PA-C. With the following instrument(s): Curette to remove Viable and Non-Viable tissue/material. Material removed includes Subcutaneous Tissue,  Slough, and Biofilm after achieving pain control using Other (lidocaine  4%). No specimens were taken. A time out was conducted at 09:37, prior to the start of the procedure. A Minimum amount of bleeding was controlled with Silver Nitrate. The procedure was tolerated well with a pain level of 0 throughout and a pain level of 0 following the procedure. Post Debridement Measurements: 1.8cm length x 0.4cm width x 1.7cm depth; 0.961cm^3 volume. Character of Wound/Ulcer Post Debridement is stable. Severity of Tissue Post Debridement is: Fat layer exposed. Post procedure Diagnosis Wound #1: Same as Pre-Procedure Plan Wound Cleansing: Wound #1 Right,Dorsal Foot: Clean wound with Normal Saline. Anesthetic (add to Medication List): Wound #1 Right,Dorsal Foot: Baranek, Brandon G. ([id number]) Topical Lidocaine 4% cream applied to wound bed prior to debridement (In Clinic Only). Primary Wound Dressing: Wound #1 Right,Dorsal Foot: Silver Alginate - cover prisma ag with sivercell Silver Collagen - pack into wound Secondary Dressing: Wound #1 Right,Dorsal Foot: Boardered Foam Dressing Dressing Change Frequency: Wound #1 Right,Dorsal Foot: Change dressing every other day. Follow-up Appointments: Wound #1 Right,Dorsal Foot: Return Appointment in 1 week. Services and Therapies ordered were: Ankle Brachial Index (ABI) - referral to [facility] for ABI in lower extremities bilateral General Notes: will submit insurance authorization for [employer] pl I am going to suggest that we initiate the above wound care measures over the next week. The patient is in agreement the plan. Subsequently we will see were things stand at follow-up. I'm gonna see about getting him approved for [employer] [address]. I explained to the patient how this can be of benefit he's in agreement with the plan. In fact if were able to get this approved he would like to have it in place for application next week. Please see above for specific wound care orders. We will see patient for re-evaluation in 1 week(s) here in the  clinic. If anything worsens or changes patient will contact our office for additional recommendations. Electronic Signature(s) Signed: 12/17/2017 9:56:08 AM By: Worthy Keeler PA-C Entered By: Worthy Keeler on 12/17/2017 08:19:04 [name], [name] ([id number]) -------------------------------------------------------------------------------- ROS/PFSH Details Patient Name: [name] Date of Service: 12/16/2017 8:45 AM Medical Record Number: [id number] Patient Account Number: [id number] Date of Birth/Sex: [birth year] (72 y.o. M) Treating RN: Montey Hora Primary Care Provider: Yaakov Guthrie Other Clinician: Referring Provider: Yaakov Guthrie Treating Provider/Extender: Melburn Hake,  Weeks in Treatment: 0 Information Obtained From Patient Wound History Do you currently have one or more open woundso Yes How many open wounds do you currently haveo 1 Approximately how long have you had your woundso 6 weeks How have you been treating your wound(s) until nowo bandage by podiatry Has your wound(s) ever healed and then re-openedo No Have you had any lab work done in the past montho No Have you tested positive for an antibiotic resistant organism (MRSA, VRE)o No Have you tested positive for osteomyelitis (bone infection)o No Have you had any tests for circulation on your legso No Constitutional Symptoms (General Health) Complaints and Symptoms: Negative for: Fatigue; Fever; Chills; Marked Weight Change Eyes Complaints and Symptoms: Positive for: Glasses / Contacts - glasses Negative for: Dry Eyes; Vision Changes Medical History: Negative for: Cataracts; Glaucoma; Optic Neuritis Ear/Nose/Mouth/Throat Complaints and Symptoms: Negative for: Difficult clearing ears; Sinusitis Medical History: Negative for: Chronic sinus problems/congestion; Middle ear problems Hematologic/Lymphatic Complaints and Symptoms: Negative for: Bleeding / Clotting Disorders; Human Immunodeficiency  Virus Medical History: Negative for: Anemia; Hemophilia; Human Immunodeficiency Virus; Lymphedema; Sickle Cell Disease Respiratory Complaints and  Symptoms: Negative for: Chronic or frequent coughs; Shortness of Breath Medical History: Negative for: Aspiration; Asthma; Chronic Obstructive Pulmonary Disease (COPD); Pneumothorax; Sleep Apnea; [name], [name] ([id number]) Tuberculosis Cardiovascular Complaints and Symptoms: Positive for: LE edema Negative for: Chest pain Medical History: Positive for: Coronary Artery Disease; Hypertension Negative for: Angina; Arrhythmia; Congestive Heart Failure; Deep Vein Thrombosis; Hypotension; Myocardial Infarction; Peripheral Arterial Disease; Peripheral Venous Disease; Phlebitis; Vasculitis Past Medical History Notes: HLD Gastrointestinal Complaints and Symptoms: Negative for: Frequent diarrhea; Nausea; Vomiting Medical History: Negative for: Cirrhosis ; Colitis; Crohnos; Hepatitis A; Hepatitis B; Hepatitis C Endocrine Complaints and Symptoms: Negative for: Hepatitis; Thyroid disease; Polydypsia (Excessive Thirst) Medical History: Positive for: Type II Diabetes Negative for: Type I Diabetes Treated with: Oral agents Genitourinary Complaints and Symptoms: Negative for: Kidney failure/ Dialysis; Incontinence/dribbling Medical History: Negative for: End Stage Renal Disease Immunological Complaints and Symptoms: Negative for: Hives; Itching Medical History: Negative for: Lupus Erythematosus; Raynaudos; Scleroderma Integumentary (Skin) Complaints and Symptoms: Positive for: Wounds; Swelling Negative for: Bleeding or bruising tendency; Breakdown Medical History: Negative for: History of Burn; History of pressure wounds Perrott, Jovi G. ([id number]) Musculoskeletal Complaints and Symptoms: Negative for: Muscle Pain; Muscle Weakness Medical History: Negative for: Gout; Rheumatoid Arthritis; Osteoarthritis;  Osteomyelitis Neurologic Complaints and Symptoms: Negative for: Numbness/parasthesias; Focal/Weakness Medical History: Negative for: Dementia; Neuropathy; Quadriplegia; Paraplegia; Seizure Disorder Psychiatric Complaints and Symptoms: Negative for: Anxiety; Claustrophobia Medical History: Negative for: Anorexia/bulimia; Confinement Anxiety Oncologic Medical History: Negative for: Received Chemotherapy; Received Radiation Immunizations Pneumococcal Vaccine: Received Pneumococcal Vaccination: Yes Implantable Devices Family and Social History Cancer: Yes - Mother,Father; Diabetes: Yes - Paternal Grandparents; Heart Disease: No; Hereditary Spherocytosis: No; Hypertension: No; Kidney Disease: No; Lung Disease: Yes - Mother; Seizures: No; Stroke: Yes - Father; Thyroid Problems: No; Tuberculosis: No; Never smoker; Marital Status - Married; Alcohol Use: Never; Drug Use: No History; Caffeine Use: Moderate; Financial Concerns: No; Food, Clothing or Shelter Needs: No; Support System Lacking: No; Transportation Concerns: No; Advanced Directives: No; Patient does not want information on Advanced Directives Electronic Signature(s) Signed: 12/16/2017 5:23:17 PM By: Montey Hora Signed: 12/16/2017 11:58:42 PM By: Worthy Keeler PA-C Entered By: Montey Hora on 12/16/2017 09:01:22 [name], [name] ([id number]) -------------------------------------------------------------------------------- SuperBill Details Patient Name: [name] Date of Service: 12/16/2017 Medical Record Number: [id number] Patient Account Number: [id number] Date of Birth/Sex: January 02, 1946 (72 y.o. M) Treating RN: Roger Shelter Primary Care Provider: Yaakov Guthrie Other Clinician: Referring Provider: Yaakov Guthrie Treating Provider/Extender: Melburn Hake,  Weeks in Treatment: 0 Diagnosis Coding ICD-10 Codes Code Description T81.31XA Disruption of external operation (surgical) wound, not elsewhere classified,  initial encounter E11.621 Type 2 diabetes mellitus with foot ulcer L97.512 Non-pressure chronic ulcer of other part of right foot with fat layer exposed I10 Essential (primary) hypertension Facility Procedures CPT4 Code: [id number] Description: [phone] - WOUND CARE VISIT-LEV 2 EST PT Modifier: Quantity: 1 CPT4 Code: [id number] Description: 11042 - DEB SUBQ TISSUE 20 SQ CM/< ICD-10 Diagnosis Description L97.512 Non-pressure chronic ulcer of other part of right foot with fat Modifier: layer exposed Quantity: 1 Physician Procedures CPT4: Description Modifier Quantity Code [id number] WC PHYS LEVEL 3 o NEW PT 25 1 ICD-10 Diagnosis Description T81.31XA Disruption of external operation (surgical) wound, not elsewhere classified, initial encounter E11.621 Type 2 diabetes mellitus with foot  ulcer L97.512 Non-pressure chronic ulcer of other part of right foot with fat layer exposed I10 Essential (primary) hypertension CPT4: [id number] 11042 - WC PHYS SUBQ TISS 20 SQ CM 1 ICD-10 Diagnosis Description L97.512 Non-pressure chronic ulcer of other  part of right foot with fat layer exposed Electronic Signature(s) Signed: 12/16/2017 11:58:42 PM By: Worthy Keeler PA-C Entered By: Worthy Keeler on 12/16/2017 23:58:02

## 2017-12-20 ENCOUNTER — Encounter: Payer: Self-pay | Admitting: Podiatry

## 2017-12-20 NOTE — Progress Notes (Signed)
DOS: 10/31/2017 Excision Morton's Neuroma 2nd interspace RT  Dr. Durenda Guthrie

## 2017-12-23 ENCOUNTER — Encounter: Payer: Medicare Other | Attending: Physician Assistant | Admitting: Physician Assistant

## 2017-12-23 DIAGNOSIS — I509 Heart failure, unspecified: Secondary | ICD-10-CM | POA: Diagnosis not present

## 2017-12-23 DIAGNOSIS — L97512 Non-pressure chronic ulcer of other part of right foot with fat layer exposed: Secondary | ICD-10-CM | POA: Diagnosis not present

## 2017-12-23 DIAGNOSIS — Z823 Family history of stroke: Secondary | ICD-10-CM | POA: Diagnosis not present

## 2017-12-23 DIAGNOSIS — E785 Hyperlipidemia, unspecified: Secondary | ICD-10-CM | POA: Diagnosis not present

## 2017-12-23 DIAGNOSIS — I110 Hypertensive heart disease with heart failure: Secondary | ICD-10-CM | POA: Diagnosis not present

## 2017-12-23 DIAGNOSIS — E11621 Type 2 diabetes mellitus with foot ulcer: Secondary | ICD-10-CM | POA: Insufficient documentation

## 2017-12-23 DIAGNOSIS — I2510 Atherosclerotic heart disease of native coronary artery without angina pectoris: Secondary | ICD-10-CM | POA: Diagnosis not present

## 2017-12-24 ENCOUNTER — Ambulatory Visit: Payer: Medicare Other

## 2017-12-24 ENCOUNTER — Ambulatory Visit
Admission: RE | Admit: 2017-12-24 | Discharge: 2017-12-24 | Disposition: A | Discharge status: Home / Self Care | Payer: Medicare Other | Source: Ambulatory Visit | Attending: Physician Assistant | Admitting: Physician Assistant

## 2017-12-24 DIAGNOSIS — L97909 Non-pressure chronic ulcer of unspecified part of unspecified lower leg with unspecified severity: Secondary | ICD-10-CM | POA: Diagnosis not present

## 2017-12-24 DIAGNOSIS — T8189XA Other complications of procedures, not elsewhere classified, initial encounter: Secondary | ICD-10-CM | POA: Insufficient documentation

## 2017-12-26 NOTE — Progress Notes (Signed)
LORD, LANCOUR ([id number]) Visit Report for 12/23/2017 Chief Complaint Document Details Patient Name: [name], [name]. Date of Service: 12/23/2017 10:45 AM Medical Record Number: [id number] Patient Account Number: [id number] Date of Birth/Sex: [birth year] (72 y.o. M) Treating RN: Roger Shelter Primary Care Provider: Yaakov Guthrie Other Clinician: Referring Provider: Yaakov Guthrie Treating Provider/Extender: Melburn Hake, Harry Bark Weeks in Treatment: 1 Information Obtained from: Patient Chief Complaint Right foot surgical wound Electronic Signature(s) Signed: 12/23/2017 1:33:13 PM By: Worthy Keeler PA-C Entered By: Worthy Keeler on 12/23/2017 10:40:23 Stoffers, [name] ([id number]) -------------------------------------------------------------------------------- Debridement Details Patient Name: [name] Date of Service: 12/23/2017 10:45 AM Medical Record Number: [id number] Patient Account Number: [id number] Date of Birth/Sex: April 12, 1945 (72 y.o. M) Treating RN: Roger Shelter Primary Care Provider: Yaakov Guthrie Other Clinician: Referring Provider: Yaakov Guthrie Treating Provider/Extender: Melburn Hake, Lagretta Loseke Weeks in Treatment: 1 Debridement Performed for Wound #1 Right,Dorsal Foot Assessment: Performed By: Physician STONE III, Jaeleigh Monaco E., PA-C Debridement Type: Debridement Severity of Tissue Pre Fat layer exposed Debridement: Level of Consciousness (Pre- Awake and Alert procedure): Pre-procedure Verification/Time Yes - 11:25 Out Taken: Start Time: 11:25 Pain Control: Other : lidocaine 4% Total Area Debrided (L x W): 2.5 (cm) x 0.9 (cm) = 2.25 (cm) Tissue and other material Viable, Non-Viable, Slough, Subcutaneous, Biofilm, Slough debrided: Level: Skin/Subcutaneous Tissue Debridement Description: Excisional Instrument: Curette Bleeding: Minimum Hemostasis Achieved: Pressure End Time: 11:26 Procedural Pain: 0 Post Procedural Pain: 0 Response to Treatment:  Procedure was tolerated well Level of Consciousness Awake and Alert (Post-procedure): Post Debridement Measurements of Total Wound Length: (cm) 2.5 Width: (cm) 0.9 Depth: (cm) 2 Volume: (cm) 3.534 Character of Wound/Ulcer Post Debridement: Stable Severity of Tissue Post Debridement: Fat layer exposed Post Procedure Diagnosis Same as Pre-procedure Electronic Signature(s) Signed: 12/23/2017 1:33:13 PM By: Worthy Keeler PA-C Signed: 12/23/2017 4:35:19 PM By: Roger Shelter Entered By: Roger Shelter on 12/23/2017 11:27:06 Commins, [name] ([id number]) -------------------------------------------------------------------------------- HPI Details Patient Name: [name] Date of Service: 12/23/2017 10:45 AM Medical Record Number: [id number] Patient Account Number: [id number] Date of Birth/Sex: 17-Apr-1945 (72 y.o. M) Treating RN: Roger Shelter Primary Care Provider: Yaakov Guthrie Other Clinician: Referring Provider: Yaakov Guthrie Treating Provider/Extender: Melburn Hake, Lorra Freeman Weeks in Treatment: 1 History of Present Illness HPI Description: 12/16/17 on evaluation today patient presents concerning an issue that he had which began roughly 2 years ago which included pain with walking and having had injections previously along with inserts which he obtained from the [facility]. Unfortunately throughout all this he was not getting significantly. Therefore he did present to Dr. 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 Treatment options were discussed included conservative versus surgical. The patient proceed with surgical intervention at that time. Subsequently the patient ended up undergoing surgery apparently on 10/31/17 although I do not see the actual surgical note when I look for epic. Subsequently he was seen back for reevaluation on 11/06/17 and since that time has been undergoing dressing changes  at Dr. Amalia Hailey office. With that being said wound care has been actually performed in their office on a regular basis since then he's been going in getting the dressings changed and rewrap and subsequently he had a significant issue where this became inflamed/infected and he was placed on Cipro 500 mg. He actually is finishing that up today and in fact the foot itself actually appears to be doing well as far as I'm concerned. Nonetheless they  have been performing good wound care according to what I'm seeing in the clinic where they were performing all of his dressing changes. Subsequently that has been from October 31, 2017 through current when I evaluated him today. The patient's wound culture which was obtained and completed did show Staphylococcus aureus and E. coli both sensitive to Cipro which is why this was utilize. The patient today states that he really wants to try to get this thing closed as quickly as possible. Patient has a history of diabetes mellitus type II with a hemoglobin A1c on 10/15/17 of 8.4. He also has a history of hypertension which is fairly well controlled. 12/23/17 on evaluation today patient actually appears to be doing better in regard to his foot ulcer. The overall appearance of the wound bed is dramatically improved compared to previous. He does not appear to have had any issues with bleeding after the chemical cauterization last week. Overall I feel like he's making good signs of improvement we have gotten approval for the Grafix PL. Electronic Signature(s) Signed: 12/23/2017 1:33:13 PM By: Worthy Keeler PA-C Entered By: Worthy Keeler on 12/23/2017 12:51:45 Withington, [name] ([id number]) -------------------------------------------------------------------------------- Physical Exam Details Patient Name: [name] Date of Service: 12/23/2017 10:45 AM Medical Record Number: [id number] Patient Account Number: [id number] Date of Birth/Sex: 1945/12/09 (72 y.o.  M) Treating RN: Roger Shelter Primary Care Provider: Yaakov Guthrie Other Clinician: Referring Provider: Yaakov Guthrie Treating Provider/Extender: STONE III, Ardath Lepak Weeks in Treatment: 1 Constitutional Well-nourished and well-hydrated in no acute distress. Respiratory normal breathing without difficulty. Psychiatric this patient is able to make decisions and demonstrates good insight into disease process. Alert and Oriented x 3. pleasant and cooperative. Notes On inspection today patient actually appears to show signs of improvement. He has no significant bleeding at this time. Overall I do not feel like the wound is really larger post debridement compared to last week it did require some debridement today although I did this very carefully at this point. He did not have any significant bleeding this was minimum and controlled with pressure no silver nitrate necessary this week which is great news. He does have tendon exposed in the base of the wound on the lateral aspect. Electronic Signature(s) Signed: 12/23/2017 1:33:13 PM By: Worthy Keeler PA-C Entered By: Worthy Keeler on 12/23/2017 12:52:33 Buer, [name] ([id number]) -------------------------------------------------------------------------------- Physician Orders Details Patient Name: [name] Date of Service: 12/23/2017 10:45 AM Medical Record Number: [id number] Patient Account Number: [id number] Date of Birth/Sex: 1946-01-06 (72 y.o. M) Treating RN: Roger Shelter Primary Care Provider: Yaakov Guthrie Other Clinician: Referring Provider: Yaakov Guthrie Treating Provider/Extender: Melburn Hake, Jaynee Winters Weeks in Treatment: 1 Verbal / Phone Orders: No Diagnosis Coding ICD-10 Coding Code Description T81.31XA Disruption of external operation (surgical) wound, not elsewhere classified, initial encounter E11.621 Type 2 diabetes mellitus with foot ulcer L97.512 Non-pressure chronic ulcer of other part of right foot with  fat layer exposed I10 Essential (primary) hypertension Wound Cleansing Wound #1 Right,Dorsal Foot o Clean wound with Normal Saline. Anesthetic (add to Medication List) Wound #1 Right,Dorsal Foot o Topical Lidocaine 4% cream applied to wound bed prior to debridement (In Clinic Only). Primary Wound Dressing Wound #1 Right,Dorsal Foot o Silver Alginate - cover prisma ag with sivercell o Silver Collagen - pack into wound Secondary Dressing Wound #1 Right,Dorsal Foot o Boardered Foam Dressing Dressing Change Frequency Wound #1 Right,Dorsal Foot o Change dressing every other day. Follow-up Appointments Wound #1 Right,Dorsal Foot o Return Appointment  in 1 week. Electronic Signature(s) Signed: 12/23/2017 1:33:13 PM By: Worthy Keeler PA-C Signed: 12/23/2017 4:35:19 PM By: Roger Shelter Entered By: Roger Shelter on 12/23/2017 11:30:44 Manus, [name] ([id number]) -------------------------------------------------------------------------------- Problem List Details Patient Name: CARSIN, RANDAZZO Date of Service: 12/23/2017 10:45 AM Medical Record Number: [id number] Patient Account Number: [id number] Date of Birth/Sex: 10/17/1945 (72 y.o. M) Treating RN: Roger Shelter Primary Care Provider: Yaakov Guthrie Other Clinician: Referring Provider: Yaakov Guthrie Treating Provider/Extender: Melburn Hake, Lancelot Alyea Weeks in Treatment: 1 Active Problems ICD-10 Evaluated Encounter Code Description Active Date Today Diagnosis T81.31XA Disruption of external operation (surgical) wound, not 12/16/2017 No Yes elsewhere classified, initial encounter E11.621 Type 2 diabetes mellitus with foot ulcer 12/16/2017 No Yes L97.512 Non-pressure chronic ulcer of other part of right foot with fat 12/16/2017 No Yes layer exposed I10 Essential (primary) hypertension 12/16/2017 No Yes Inactive Problems Resolved Problems Electronic Signature(s) Signed: 12/23/2017 1:33:13 PM By: Worthy Keeler  PA-C Entered By: Worthy Keeler on 12/23/2017 10:40:17 Ginger, [name] ([id number]) -------------------------------------------------------------------------------- Progress Note Details Patient Name: [name] Date of Service: 12/23/2017 10:45 AM Medical Record Number: [id number] Patient Account Number: [id number] Date of Birth/Sex: Apr 29, 1945 (72 y.o. M) Treating RN: Roger Shelter Primary Care Provider: Yaakov Guthrie Other Clinician: Referring Provider: Yaakov Guthrie Treating Provider/Extender: Melburn Hake, Tavi Hoogendoorn Weeks in Treatment: 1 Subjective Chief Complaint Information obtained from Patient Right foot surgical wound History of Present Illness (HPI) 12/16/17 on evaluation today patient presents concerning an issue that he had which began roughly 2 years ago which included pain with walking and having had injections previously along with inserts which he obtained from the [facility]. Unfortunately throughout all this he was not getting significantly. Therefore he did present to Dr. Daylene Katayama who is a local podiatrist who diagnosed him with a Morton s Neuroma of the second interspace right foot. Treatment options were discussed included conservative versus surgical. The patient proceed with surgical intervention at that time. Subsequently the patient ended up undergoing surgery apparently on 10/31/17 although I do not see the actual surgical note when I look for epic. Subsequently he was seen back for reevaluation on 11/06/17 and since that time has been undergoing dressing changes at Dr. Amalia Hailey office. With that being said wound care has been actually performed in their office on a regular basis since then he's been going in getting the dressings changed and rewrap and subsequently he had a significant issue where this became inflamed/infected and he was placed on Cipro 500 mg. He actually is finishing that up today and in fact the foot itself actually appears to be doing  well as far as I'm concerned. Nonetheless they have been performing good wound care according to what I'm seeing in the clinic where they were performing all of his dressing changes. Subsequently that has been from October 31, 2017 through current when I evaluated him today. The patient's wound culture which was obtained and completed did show Staphylococcus aureus and E. coli both sensitive to Cipro which is why this was utilize. The patient today states that he really wants to try to get this thing closed as quickly as possible. Patient has a history of diabetes mellitus type II with a hemoglobin A1c on 10/15/17 of 8.4. He also has a history of hypertension which is fairly well controlled. 12/23/17 on evaluation today patient actually appears to be doing better in regard to his foot ulcer. The overall appearance of the wound bed is dramatically improved compared to previous.  He does not appear to have had any issues with bleeding after the chemical cauterization last week. Overall I feel like he's making good signs of improvement we have gotten approval for the Grafix PL. Patient History Information obtained from Patient. Family History Cancer - Mother,Father, Diabetes - Paternal Grandparents, Lung Disease - Mother, Stroke - Father, No family history of Heart Disease, Hereditary Spherocytosis, Hypertension, Kidney Disease, Seizures, Thyroid Problems, Tuberculosis. Social History Never smoker, Marital Status - Married, Alcohol Use - Never, Drug Use - No History, Caffeine Use - Moderate. Medical And Surgical History Notes Cardiovascular HLD Review of Systems (ROS) Constitutional Symptoms (General Health) Denies complaints or symptoms of Fever, Chills. TISHAWN, FRIEDHOFF ([id number]) Respiratory The patient has no complaints or symptoms. Cardiovascular The patient has no complaints or symptoms. Psychiatric The patient has no complaints or symptoms. Objective Constitutional Well-nourished  and well-hydrated in no acute distress. Vitals Time Taken: 10:48 AM, Temperature: 98.1 F, Pulse: 73 bpm, Respiratory Rate: 16 breaths/min, Blood Pressure: 133/73 mmHg. Respiratory normal breathing without difficulty. Psychiatric this patient is able to make decisions and demonstrates good insight into disease process. Alert and Oriented x 3. pleasant and cooperative. General Notes: On inspection today patient actually appears to show signs of improvement. He has no significant bleeding at this time. Overall I do not feel like the wound is really larger post debridement compared to last week it did require some debridement today although I did this very carefully at this point. He did not have any significant bleeding this was minimum and controlled with pressure no silver nitrate necessary this week which is great news. He does have tendon exposed in the base of the wound on the lateral aspect. Integumentary (Hair, Skin) Wound #1 status is Open. Original cause of wound was Surgical Injury. The wound is located on the Right,Dorsal Foot. The wound measures 2.5cm length x 0.9cm width x 2cm depth; 1.767cm^2 area and 3.534cm^3 volume. There is Fat Layer (Subcutaneous Tissue) Exposed exposed. There is no tunneling or undermining noted. There is a medium amount of serous drainage noted. The wound margin is flat and intact. There is small (1-33%) pink granulation within the wound bed. There is a large (67-100%) amount of necrotic tissue within the wound bed including Eschar and Adherent Slough. The periwound skin appearance did not exhibit: Callus, Crepitus, Excoriation, Induration, Rash, Scarring, Dry/Scaly, Maceration, Atrophie Blanche, Cyanosis, Ecchymosis, Hemosiderin Staining, Mottled, Pallor, Rubor, Erythema. Periwound temperature was noted as No Abnormality. Assessment Active Problems ICD-10 Disruption of external operation (surgical) wound, not elsewhere classified, initial encounter Type  2 diabetes mellitus with foot ulcer Hefferan, Jaque G. ([id number]) Non-pressure chronic ulcer of other part of right foot with fat layer exposed Essential (primary) hypertension Procedures Wound #1 Pre-procedure diagnosis of Wound #1 is a Diabetic Wound/Ulcer of the Lower Extremity located on the Right,Dorsal Foot .Severity of Tissue Pre Debridement is: Fat layer exposed. There was a Excisional Skin/Subcutaneous Tissue Debridement with a total area of 2.25 sq cm performed by STONE III, Terrance Lanahan E., PA-C. With the following instrument(s): Curette to remove Viable and Non-Viable tissue/material. Material removed includes Subcutaneous Tissue, Slough, and Biofilm after achieving pain control using Other (lidocaine 4%). No specimens were taken. A time out was conducted at 11:25, prior to the start of the procedure. A Minimum amount of bleeding was controlled with Pressure. The procedure was tolerated well with a pain level of 0 throughout and a pain level of 0 following the procedure. Post Debridement Measurements: 2.5cm length x 0.9cm width  x 2cm depth; 3.534cm^3 volume. Character of Wound/Ulcer Post Debridement is stable. Severity of Tissue Post Debridement is: Fat layer exposed. Post procedure Diagnosis Wound #1: Same as Pre-Procedure Plan Wound Cleansing: Wound #1 Right,Dorsal Foot: Clean wound with Normal Saline. Anesthetic (add to Medication List): Wound #1 Right,Dorsal Foot: Topical Lidocaine 4% cream applied to wound bed prior to debridement (In Clinic Only). Primary Wound Dressing: Wound #1 Right,Dorsal Foot: Silver Alginate - cover prisma ag with sivercell Silver Collagen - pack into wound Secondary Dressing: Wound #1 Right,Dorsal Foot: Boardered Foam Dressing Dressing Change Frequency: Wound #1 Right,Dorsal Foot: Change dressing every other day. Follow-up Appointments: Wound #1 Right,Dorsal Foot: Return Appointment in 1 week. I'm gonna suggest currently that we continue with the  above wound care measures for the next period of time until follow-up. We will subsequently see him back for reevaluation at that time to see were things stand. In the meantime we will go ahead and order the Grafix PL for him as well which I think will be completely appropriate and hopefully will allow this to heal more readily going forward. If anything changes or worsens in the interim he will contact the office and let me know. Otherwise I'll see him back for reevaluation at that point. ANICETO, KYSER ([id number]) Please see above for specific wound care orders. We will see patient for re-evaluation in 2 week(s) here in the clinic. If anything worsens or changes patient will contact our office for additional recommendations. Electronic Signature(s) Signed: 12/23/2017 1:33:13 PM By: Worthy Keeler PA-C Entered By: Worthy Keeler on 12/23/2017 12:53:23 Samuelson, [name] ([id number]) -------------------------------------------------------------------------------- ROS/PFSH Details Patient Name: [name] Date of Service: 12/23/2017 10:45 AM Medical Record Number: [id number] Patient Account Number: [id number] Date of Birth/Sex: 22-Aug-1945 (72 y.o. M) Treating RN: Roger Shelter Primary Care Provider: Yaakov Guthrie Other Clinician: Referring Provider: Yaakov Guthrie Treating Provider/Extender: Melburn Hake, Jaanai Salemi Weeks in Treatment: 1 Information Obtained From Patient Wound History Do you currently have one or more open woundso Yes How many open wounds do you currently haveo 1 Approximately how long have you had your woundso 6 weeks How have you been treating your wound(s) until nowo bandage by podiatry Has your wound(s) ever healed and then re-openedo No Have you had any lab work done in the past montho No Have you tested positive for an antibiotic resistant organism (MRSA, VRE)o No Have you tested positive for osteomyelitis (bone infection)o No Have you had any tests for circulation  on your legso No Constitutional Symptoms (General Health) Complaints and Symptoms: Negative for: Fever; Chills Eyes Medical History: Negative for: Cataracts; Glaucoma; Optic Neuritis Ear/Nose/Mouth/Throat Medical History: Negative for: Chronic sinus problems/congestion; Middle ear problems Hematologic/Lymphatic Medical History: Negative for: Anemia; Hemophilia; Human Immunodeficiency Virus; Lymphedema; Sickle Cell Disease Respiratory Complaints and Symptoms: No Complaints or Symptoms Medical History: Negative for: Aspiration; Asthma; Chronic Obstructive Pulmonary Disease (COPD); Pneumothorax; Sleep Apnea; Tuberculosis Cardiovascular Complaints and Symptoms: No Complaints or Symptoms Medical History: Positive for: Coronary Artery Disease; Hypertension Negative for: Angina; Arrhythmia; Congestive Heart Failure; Deep Vein Thrombosis; Hypotension; Myocardial Infarction; Armel, Tell G. ([id number]) Peripheral Arterial Disease; Peripheral Venous Disease; Phlebitis; Vasculitis Past Medical History Notes: HLD Gastrointestinal Medical History: Negative for: Cirrhosis ; Colitis; Crohnos; Hepatitis A; Hepatitis B; Hepatitis C Endocrine Medical History: Positive for: Type II Diabetes Negative for: Type I Diabetes Treated with: Oral agents Genitourinary Medical History: Negative for: End Stage Renal Disease Immunological Medical History: Negative for: Lupus Erythematosus; Raynaudos; Scleroderma Integumentary (Skin) Medical History: Negative  for: History of Burn; History of pressure wounds Musculoskeletal Medical History: Negative for: Gout; Rheumatoid Arthritis; Osteoarthritis; Osteomyelitis Neurologic Medical History: Negative for: Dementia; Neuropathy; Quadriplegia; Paraplegia; Seizure Disorder Oncologic Medical History: Negative for: Received Chemotherapy; Received Radiation Psychiatric Complaints and Symptoms: No Complaints or Symptoms Medical History: Negative for:  Anorexia/bulimia; Confinement Anxiety Immunizations Pneumococcal Vaccine: Received Pneumococcal Vaccination: Yes ASAHD, CAN ([id number]) Implantable Devices Family and Social History Cancer: Yes - Mother,Father; Diabetes: Yes - Paternal Grandparents; Heart Disease: No; Hereditary Spherocytosis: No; Hypertension: No; Kidney Disease: No; Lung Disease: Yes - Mother; Seizures: No; Stroke: Yes - Father; Thyroid Problems: No; Tuberculosis: No; Never smoker; Marital Status - Married; Alcohol Use: Never; Drug Use: No History; Caffeine Use: Moderate; Financial Concerns: No; Food, Clothing or Shelter Needs: No; Support System Lacking: No; Transportation Concerns: No; Advanced Directives: No; Patient does not want information on Advanced Directives Physician Affirmation I have reviewed and agree with the above information. Electronic Signature(s) Signed: 12/23/2017 1:33:13 PM By: Worthy Keeler PA-C Signed: 12/23/2017 4:35:19 PM By: Roger Shelter Entered By: Worthy Keeler on 12/23/2017 12:51:59 Pellerito, [name] ([id number]) -------------------------------------------------------------------------------- SuperBill Details Patient Name: [name] Date of Service: 12/23/2017 Medical Record Number: [id number] Patient Account Number: [id number] Date of Birth/Sex: [birth year] (72 y.o. M) Treating RN: Roger Shelter Primary Care Provider: Yaakov Guthrie Other Clinician: Referring Provider: Yaakov Guthrie Treating Provider/Extender: Melburn Hake, Cloy Cozzens Weeks in Treatment: 1 Diagnosis Coding ICD-10 Codes Code Description T81.31XA Disruption of external operation (surgical) wound, not elsewhere classified, initial encounter E11.621 Type 2 diabetes mellitus with foot ulcer L97.512 Non-pressure chronic ulcer of other part of right foot with fat layer exposed I10 Essential (primary) hypertension Facility Procedures CPT4 Code: [id number] Description: [place] - DEB SUBQ TISSUE 20 SQ CM/< ICD-10  Diagnosis Description L97.512 Non-pressure chronic ulcer of other part of right foot with fat Modifier: layer exposed Quantity: 1 Physician Procedures CPT4 Code: [id number] Description: 11042 - WC PHYS SUBQ TISS 20 SQ CM ICD-10 Diagnosis Description L97.512 Non-pressure chronic ulcer of other part of right foot with fat Modifier: layer exposed Quantity: 1 Electronic Signature(s) Signed: 12/23/2017 1:33:13 PM By: Worthy Keeler PA-C Entered By: Worthy Keeler on 12/23/2017 12:53:33

## 2017-12-26 NOTE — Progress Notes (Signed)
JARRYN, ALTLAND ([id number]) Visit Report for 12/23/2017 Arrival Information Details Patient Name: MACALLAN, ORD. Date of Service: 12/23/2017 10:45 AM Medical Record Number: [id number] Patient Account Number: [id number] Date of Birth/Sex: 06/25/1945 (72 y.o. M) Treating RN: Roger Shelter Primary Care Kairee Kozma: Yaakov Guthrie Other Clinician: Referring Ahnya Akre: Yaakov Guthrie Treating Tasheka Houseman/Extender: Melburn Hake, HOYT Weeks in Treatment: 1 Visit Information History Since Last Visit Added or deleted any medications: No Patient Arrived: Ambulatory Any new allergies or adverse reactions: No Arrival Time: 10:47 Had a fall or experienced change in No Accompanied By: self activities of daily living that may affect Transfer Assistance: None risk of falls: Patient Identification Verified: Yes Signs or symptoms of abuse/neglect since last visito No Secondary Verification Process Completed: Yes Hospitalized since last visit: No Patient Has Alerts: Yes Implantable device outside of the clinic excluding No Patient Alerts: DMII cellular tissue based products placed in the center since last visit: Has Dressing in Place as Prescribed: Yes Pain Present Now: No Electronic Signature(s) Signed: 12/23/2017 11:56:54 AM By: Lorine Bears RCP, RRT, CHT Entered By: Lorine Bears on 12/23/2017 10:48:26 Denomme, [name] ([id number]) -------------------------------------------------------------------------------- Lower Extremity Assessment Details Patient Name: [name] Date of Service: 12/23/2017 10:45 AM Medical Record Number: [id number] Patient Account Number: [id number] Date of Birth/Sex: 11-03-1945 (72 y.o. M) Treating RN: Secundino Ginger Primary Care Collyn Ribas: Yaakov Guthrie Other Clinician: Referring Waunetta Riggle: Yaakov Guthrie Treating Secundino Ellithorpe/Extender: Melburn Hake, HOYT Weeks in Treatment: 1 Edema Assessment Assessed: [Left: No] [Right: No] Edema: [Left: No]  [Right: No] Calf Left: Right: Point of Measurement: 40 cm From Medial Instep cm 36 cm Ankle Left: Right: Point of Measurement: 13 cm From Medial Instep cm 24 cm Vascular Assessment Claudication: Claudication Assessment [Right:None] Pulses: Dorsalis Pedis Palpable: [Right:Yes] Posterior Tibial Extremity colors, hair growth, and conditions: Extremity Color: [Right:Normal] Hair Growth on Extremity: [Right:No] Temperature of Extremity: [Right:Warm] Capillary Refill: [Right:< 3 seconds] Toe Nail Assessment Left: Right: Thick: No Discolored: No Deformed: No Improper Length and Hygiene: No Electronic Signature(s) Signed: 12/23/2017 11:25:08 AM By: Secundino Ginger Entered By: Secundino Ginger on 12/23/2017 11:00:47 Nutting, [name] ([id number]) -------------------------------------------------------------------------------- Multi Wound Chart Details Patient Name: [name] Date of Service: 12/23/2017 10:45 AM Medical Record Number: [id number] Patient Account Number: [id number] Date of Birth/Sex: 1945-05-22 (72 y.o. M) Treating RN: Roger Shelter Primary Care Montie Swiderski: Yaakov Guthrie Other Clinician: Referring Annaya Bangert: Yaakov Guthrie Treating Cynithia Hakimi/Extender: Melburn Hake, HOYT Weeks in Treatment: 1 Vital Signs Height(in): Pulse(bpm): 73 Weight(lbs): Blood Pressure(mmHg): 133/73 Body Mass Index(BMI): Temperature(F): 98.1 Respiratory Rate 16 (breaths/min): Photos: [N/A:N/A] Wound Location: Right Foot - Dorsal N/A N/A Wounding Event: Surgical Injury N/A N/A Primary Etiology: Diabetic Wound/Ulcer of the N/A N/A Lower Extremity Secondary Etiology: Open Surgical Wound N/A N/A Comorbid History: Coronary Artery Disease, N/A N/A Hypertension, Type II Diabetes Date Acquired: 10/30/2017 N/A N/A Weeks of Treatment: 1 N/A N/A Wound Status: Open N/A N/A Measurements L x W x D 2.5x0.9x2 N/A N/A (cm) Area (cm) : 1.767 N/A N/A Volume (cm) : 3.534 N/A N/A % Reduction in Area:  -212.70% N/A N/A % Reduction in Volume: -290.50% N/A N/A Classification: Grade 1 N/A N/A Exudate Amount: Medium N/A N/A Exudate Type: Serous N/A N/A Exudate Color: amber N/A N/A Wound Margin: Flat and Intact N/A N/A Granulation Amount: Small (1-33%) N/A N/A Granulation Quality: Pink N/A N/A Necrotic Amount: Large (67-100%) N/A N/A Necrotic Tissue: Eschar, Adherent Slough N/A N/A Exposed Structures: Fat Layer (Subcutaneous N/A N/A Tissue) Exposed: Yes Fascia: No Tendon: No Pettet, Balin  G. ([id number]) Muscle: No Joint: No Bone: No Epithelialization: None N/A N/A Periwound Skin Texture: Excoriation: No N/A N/A Induration: No Callus: No Crepitus: No Rash: No Scarring: No Periwound Skin Moisture: Maceration: No N/A N/A Dry/Scaly: No Periwound Skin Color: Atrophie Blanche: No N/A N/A Cyanosis: No Ecchymosis: No Erythema: No Hemosiderin Staining: No Mottled: No Pallor: No Rubor: No Temperature: No Abnormality N/A N/A Tenderness on Palpation: No N/A N/A Wound Preparation: Ulcer Cleansing: N/A N/A Rinsed/Irrigated with Saline Topical Anesthetic Applied: Other: lidocaine 4% Treatment Notes Electronic Signature(s) Signed: 12/23/2017 4:35:19 PM By: Roger Shelter Entered By: Roger Shelter on 12/23/2017 11:23:09 DAGON, BUDAI ([id number]) -------------------------------------------------------------------------------- [place] Details Patient Name: [name] Date of Service: 12/23/2017 10:45 AM Medical Record Number: [id number] Patient Account Number: [id number] Date of Birth/Sex: Oct 01, 1945 (72 y.o. M) Treating RN: Roger Shelter Primary Care Lysandra Loughmiller: Yaakov Guthrie Other Clinician: Referring Tyresha Fede: Yaakov Guthrie Treating Fiora Weill/Extender: Melburn Hake, HOYT Weeks in Treatment: 1 Active Inactive ` Orientation to the Wound Care Program Nursing Diagnoses: Knowledge deficit related to the wound healing center  program Goals: Patient/caregiver will verbalize understanding of the [place] Program Date Initiated: 12/16/2017 Target Resolution Date: 01/06/2018 Goal Status: Active Interventions: Provide education on orientation to the wound center Notes: ` Wound/Skin Impairment Nursing Diagnoses: Impaired tissue integrity Goals: Patient/caregiver will verbalize understanding of skin care regimen Date Initiated: 12/16/2017 Target Resolution Date: 01/12/2018 Goal Status: Active Ulcer/skin breakdown will have a volume reduction of 30% by week 4 Date Initiated: 12/16/2017 Target Resolution Date: 01/12/2018 Goal Status: Active Interventions: Assess patient/caregiver ability to obtain necessary supplies Assess patient/caregiver ability to perform ulcer/skin care regimen upon admission and as needed Assess ulceration(s) every visit Treatment Activities: Skin care regimen initiated : 12/16/2017 Notes: Electronic Signature(s) Signed: 12/23/2017 4:35:19 PM By: Aaron Mose ([id number]) Entered By: Roger Shelter on 12/23/2017 11:22:55 [name] ([id number]) -------------------------------------------------------------------------------- Pain Assessment Details Patient Name: [name] Date of Service: 12/23/2017 10:45 AM Medical Record Number: [id number] Patient Account Number: [id number] Date of Birth/Sex: 1946/03/02 (72 y.o. M) Treating RN: Roger Shelter Primary Care Oyindamola Key: Yaakov Guthrie Other Clinician: Referring Ronnald Shedden: Yaakov Guthrie Treating Herma Uballe/Extender: Melburn Hake, HOYT Weeks in Treatment: 1 Active Problems Location of Pain Severity and Description of Pain Patient Has Paino No Site Locations Pain Management and Medication Current Pain Management: Electronic Signature(s) Signed: 12/23/2017 11:56:54 AM By: Lorine Bears RCP, RRT, CHT Signed: 12/23/2017 4:35:19 PM By: Roger Shelter Entered By: Lorine Bears on 12/23/2017 10:48:38 Bachmeier, [name] ([id number]) -------------------------------------------------------------------------------- Patient/Caregiver Education Details Patient Name: [name] Date of Service: 12/23/2017 10:45 AM Medical Record Number: [id number] Patient Account Number: [id number] Date of Birth/Gender: 1945/08/29 (72 y.o. M) Treating RN: Roger Shelter Primary Care Physician: Yaakov Guthrie Other Clinician: Referring Physician: Yaakov Guthrie Treating Physician/Extender: Sharalyn Ink in Treatment: 1 Education Assessment Education Provided To: Patient Education Topics Provided Wound Debridement: Handouts: Wound Debridement Methods: Explain/Verbal Responses: State content correctly Wound/Skin Impairment: Handouts: Caring for Your Ulcer Methods: Explain/Verbal Responses: State content correctly Electronic Signature(s) Signed: 12/23/2017 4:35:19 PM By: Roger Shelter Entered By: Roger Shelter on 12/23/2017 11:31:14 Fiala, [name] ([id number]) -------------------------------------------------------------------------------- Wound Assessment Details Patient Name: [name] Date of Service: 12/23/2017 10:45 AM Medical Record Number: [id number] Patient Account Number: [id number] Date of Birth/Sex: September 27, 1945 (72 y.o. M) Treating RN: Secundino Ginger Primary Care Keziah Avis: Yaakov Guthrie Other Clinician: Referring Khya Halls: Yaakov Guthrie Treating Mira Balon/Extender: STONE III, HOYT Weeks in Treatment: 1 Wound Status Wound Number: 1 Primary  Etiology: Diabetic Wound/Ulcer of the Lower Extremity Wound Location: Right Foot - Dorsal Secondary Open Surgical Wound Wounding Event: Surgical Injury Etiology: Date Acquired: 10/30/2017 Wound Status: Open Weeks Of Treatment: 1 Comorbid Coronary Artery Disease, Hypertension, Clustered Wound: No History: Type II Diabetes Photos Photo Uploaded By: Secundino Ginger on 12/23/2017 11:03:33 Wound  Measurements Length: (cm) 2.5 % Reduction Width: (cm) 0.9 % Reduction Depth: (cm) 2 Epitheliali Area: (cm) 1.767 Tunneling: Volume: (cm) 3.534 Underminin in Area: -212.7% in Volume: -290.5% zation: None No g: No Wound Description Classification: Grade 1 Foul Odor Wound Margin: Flat and Intact Slough/Fib Exudate Amount: Medium Exudate Type: Serous Exudate Color: amber After Cleansing: No rino Yes Wound Bed Granulation Amount: Small (1-33%) Exposed Structure Granulation Quality: Pink Fascia Exposed: No Necrotic Amount: Large (67-100%) Fat Layer (Subcutaneous Tissue) Exposed: Yes Necrotic Quality: Eschar, Adherent Slough Tendon Exposed: No Muscle Exposed: No Joint Exposed: No Bone Exposed: No Periwound Skin Texture Coltrane, Yazid G. ([id number]) Texture Color No Abnormalities Noted: No No Abnormalities Noted: No Callus: No Atrophie Blanche: No Crepitus: No Cyanosis: No Excoriation: No Ecchymosis: No Induration: No Erythema: No Rash: No Hemosiderin Staining: No Scarring: No Mottled: No Pallor: No Moisture Rubor: No No Abnormalities Noted: No Dry / Scaly: No Temperature / Pain Maceration: No Temperature: No Abnormality Wound Preparation Ulcer Cleansing: Rinsed/Irrigated with Saline Topical Anesthetic Applied: Other: lidocaine 4%, Electronic Signature(s) Signed: 12/23/2017 11:25:08 AM By: Secundino Ginger Entered By: Secundino Ginger on 12/23/2017 10:59:08 [name] ([id number]) -------------------------------------------------------------------------------- Vitals Details Patient Name: [name] Date of Service: 12/23/2017 10:45 AM Medical Record Number: [id number] Patient Account Number: [id number] Date of Birth/Sex: December 03, 1945 (72 y.o. M) Treating RN: Roger Shelter Primary Care Makiyah Zentz: Yaakov Guthrie Other Clinician: Referring Zeb Rawl: Yaakov Guthrie Treating Maleea Camilo/Extender: Melburn Hake, HOYT Weeks in Treatment: 1 Vital Signs Time Taken:  10:48 Temperature (F): 98.1 Pulse (bpm): 73 Respiratory Rate (breaths/min): 16 Blood Pressure (mmHg): 133/73 Reference Range: 80 - 120 mg / dl Electronic Signature(s) Signed: 12/23/2017 11:56:54 AM By: Lorine Bears RCP, RRT, CHT Entered By: Lorine Bears on 12/23/2017 10:51:15

## 2018-01-06 ENCOUNTER — Ambulatory Visit: Payer: Medicare Other | Admitting: Physician Assistant

## 2018-01-07 ENCOUNTER — Encounter: Payer: Medicare Other | Admitting: Physician Assistant

## 2018-01-07 DIAGNOSIS — L97512 Non-pressure chronic ulcer of other part of right foot with fat layer exposed: Secondary | ICD-10-CM | POA: Diagnosis not present

## 2018-01-07 DIAGNOSIS — E11621 Type 2 diabetes mellitus with foot ulcer: Secondary | ICD-10-CM | POA: Diagnosis not present

## 2018-01-07 DIAGNOSIS — E785 Hyperlipidemia, unspecified: Secondary | ICD-10-CM | POA: Diagnosis not present

## 2018-01-07 DIAGNOSIS — T8189XA Other complications of procedures, not elsewhere classified, initial encounter: Secondary | ICD-10-CM | POA: Diagnosis not present

## 2018-01-07 DIAGNOSIS — I110 Hypertensive heart disease with heart failure: Secondary | ICD-10-CM | POA: Diagnosis not present

## 2018-01-07 DIAGNOSIS — I509 Heart failure, unspecified: Secondary | ICD-10-CM | POA: Diagnosis not present

## 2018-01-07 DIAGNOSIS — I2510 Atherosclerotic heart disease of native coronary artery without angina pectoris: Secondary | ICD-10-CM | POA: Diagnosis not present

## 2018-01-08 DIAGNOSIS — Z23 Encounter for immunization: Secondary | ICD-10-CM | POA: Diagnosis not present

## 2018-01-08 DIAGNOSIS — Z85828 Personal history of other malignant neoplasm of skin: Secondary | ICD-10-CM | POA: Diagnosis not present

## 2018-01-08 DIAGNOSIS — L821 Other seborrheic keratosis: Secondary | ICD-10-CM | POA: Diagnosis not present

## 2018-01-08 DIAGNOSIS — L570 Actinic keratosis: Secondary | ICD-10-CM | POA: Diagnosis not present

## 2018-01-08 DIAGNOSIS — D225 Melanocytic nevi of trunk: Secondary | ICD-10-CM | POA: Diagnosis not present

## 2018-01-08 DIAGNOSIS — Z808 Family history of malignant neoplasm of other organs or systems: Secondary | ICD-10-CM | POA: Diagnosis not present

## 2018-01-10 NOTE — Progress Notes (Signed)
Brandon, Barajas ([id number]) Visit Report for 01/07/2018 Arrival Information Details Patient Name: [name], [name]. Date of Service: 01/07/2018 12:30 PM Medical Record Number: [id number] Patient Account Number: [id number] Date of Birth/Sex: 07-16-1945 (72 y.o. M) Treating RN: [name] Primary Care [name]: [name] Other Clinician: Referring [name]: [name] Treating [name]/Extender: [name] Weeks in Treatment: 3 Visit Information History Since Last Visit Added or deleted any medications: No Patient Arrived: Ambulatory Any new allergies or adverse reactions: No Arrival Time: 12:33 Had a fall or experienced change in No Accompanied By: self activities of daily living that may affect Transfer Assistance: None risk of falls: Patient Identification Verified: Yes Signs or symptoms of abuse/neglect since last visito No Secondary Verification Process Yes Hospitalized since last visit: No Completed: Implantable device outside of the clinic excluding No Patient Has Alerts: Yes cellular tissue based products placed in the center Patient Alerts: DMII since last visit: ABI 12/24/17 L 1.2 R Has Dressing in Place as Prescribed: Yes 1.2 Has Footwear/Offloading in Place as Prescribed: Yes Left: Wedge Shoe Pain Present Now: No Electronic Signature(s) Signed: 01/07/2018 5:03:55 PM By: [name] Entered By: [name] on 01/07/2018 13:49:42 [place], [name] ([id number]) -------------------------------------------------------------------------------- Encounter Discharge Information Details Patient Name: [name] Date of Service: 01/07/2018 12:30 PM Medical Record Number: [id number] Patient Account Number: [id number] Date of Birth/Sex: 14-Oct-1945 (72 y.o. M) Treating RN: [name] Primary Care Meygan Kyser: [name] Other Clinician: Referring [name]: [name] Treating [name]/Extender: [name] Weeks in Treatment: 3 Encounter  Discharge Information Items Discharge Condition: Stable Ambulatory Status: Ambulatory Discharge Destination: Home Transportation: Private Auto Accompanied By: self Schedule Follow-up Appointment: Yes Clinical Summary of Care: Post Procedure Vitals: Temperature (F): 98.3 Pulse (bpm): 79 Respiratory Rate (breaths/min): 16 Blood Pressure (mmHg): 137/69 Electronic Signature(s) Signed: 01/07/2018 5:03:55 PM By: [name] Entered By: [name] on 01/07/2018 13:09:08 [name], [name] ([id number]) -------------------------------------------------------------------------------- Lower Extremity Assessment Details Patient Name: [name] Date of Service: 01/07/2018 12:30 PM Medical Record Number: [id number] Patient Account Number: [id number] Date of Birth/Sex: September 26, 1945 (72 y.o. M) Treating RN: [name] Primary Care [name]: [name] Other Clinician: Referring Mykal Kirchman: [name] Treating Tanequa Kretz/Extender: [name] Weeks in Treatment: 3 Edema Assessment Assessed: [Left: No] [Right: No] Edema: [Left: N] [Right: o] Vascular Assessment Pulses: Dorsalis Pedis Palpable: [Right:Yes] Posterior Tibial Extremity colors, hair growth, and conditions: Extremity Color: [Right:Normal] Hair Growth on Extremity: [Right:No] Temperature of Extremity: [Right:Warm] Capillary Refill: [Right:< 3 seconds] Toe Nail Assessment Left: Right: Thick: No Discolored: No Deformed: No Improper Length and Hygiene: Yes Electronic Signature(s) Signed: 01/07/2018 4:14:49 PM By: Brandon Cool, BSN, RN, CWS, Kim RN, BSN Entered By: [name] on 01/07/2018 12:41:15 [name] ([id number]) -------------------------------------------------------------------------------- Multi Wound Chart Details Patient Name: [name] Date of Service: 01/07/2018 12:30 PM Medical Record Number: [id number] Patient Account Number: [id number] Date of Birth/Sex: [birth year] (72  y.o. M) Treating RN: [name] Primary Care [name]: [name] Other Clinician: Referring [name]: [name] Treating [name]/Extender: [name] Weeks in Treatment: 3 Vital Signs Height(in): 77 Pulse(bpm): 79 Weight(lbs): 240 Blood Pressure(mmHg): 137/69 Body Mass Index(BMI): 28 Temperature(F): 98.3 Respiratory Rate 16 (breaths/min): Photos: [1:No Photos] [N/A:N/A] Wound Location: [1:Right Foot - Dorsal] [N/A:N/A] Wounding Event: [1:Surgical Injury] [N/A:N/A] Primary Etiology: [1:Diabetic Wound/Ulcer of the N/A Lower Extremity] Secondary Etiology: [1:Open Surgical Wound] [N/A:N/A] Comorbid History: [1:Coronary Artery Disease, Hypertension, Type II Diabetes] [N/A:N/A] Date Acquired: [1:10/30/2017] [N/A:N/A] Weeks of Treatment: [1:3] [N/A:N/A] Wound Status: [1:Open] [N/A:N/A]  Measurements L x W x D [1:1.5x0.5x0.8] [N/A:N/A] (cm) Area (cm) : [1:0.589] [N/A:N/A] Volume (cm) : [1:0.471] [N/A:N/A] % Reduction in Area: [1:-4.20%] [N/A:N/A] % Reduction in Volume: [1:48.00%] [N/A:N/A] Classification: [1:Grade 1] [N/A:N/A] Exudate Amount: [1:Medium] [N/A:N/A] Exudate Type: [1:Serous] [N/A:N/A] Exudate Color: [1:amber] [N/A:N/A] Wound Margin: [1:Flat and Intact] [N/A:N/A] Granulation Amount: [1:Small (1-33%)] [N/A:N/A] Granulation Quality: [1:Pink] [N/A:N/A] Necrotic Amount: [1:Large (67-100%)] [N/A:N/A] Necrotic Tissue: [1:Eschar, Adherent Slough] [N/A:N/A] Exposed Structures: [1:Fat Layer (Subcutaneous Tissue) Exposed: Yes Fascia: No Tendon: No Muscle: No Joint: No Bone: No] [N/A:N/A] Epithelialization: [1:None] [N/A:N/A] Periwound Skin Texture: [1:Excoriation: No Induration: No Callus: No] [N/A:N/A] Crepitus: No Rash: No Scarring: No Periwound Skin Moisture: Maceration: No N/A N/A Dry/Scaly: No Periwound Skin Color: Atrophie Blanche: No N/A N/A Cyanosis: No Ecchymosis: No Erythema: No Hemosiderin Staining: No Mottled: No Pallor: No Rubor:  No Temperature: No Abnormality N/A N/A Tenderness on Palpation: No N/A N/A Wound Preparation: Ulcer Cleansing: N/A N/A Rinsed/Irrigated with Saline Topical Anesthetic Applied: Other: lidocaine 4% Treatment Notes Electronic Signature(s) Signed: 01/07/2018 5:03:55 PM By: [name] Entered By: [name] on 01/07/2018 12:49:11 [place], [name] ([id number]) -------------------------------------------------------------------------------- [place] Details Patient Name: [name] Date of Service: 01/07/2018 12:30 PM Medical Record Number: [id number] Patient Account Number: [id number] Date of Birth/Sex: 09-03-1945 (72 y.o. M) Treating RN: [name] Primary Care Sonakshi Rolland: [name] Other Clinician: Referring Sharetha Newson: [name] Treating Stormy Connon/Extender: [name] Weeks in Treatment: 3 Active Inactive ` Orientation to the Wound Care Program Nursing Diagnoses: Knowledge deficit related to the wound healing center program Goals: Patient/caregiver will verbalize understanding of the [facility] Program Date Initiated: 12/16/2017 Target Resolution Date: 01/06/2018 Goal Status: Active Interventions: Provide education on orientation to the wound center Notes: ` Wound/Skin Impairment Nursing Diagnoses: Impaired tissue integrity Goals: Patient/caregiver will verbalize understanding of skin care regimen Date Initiated: 12/16/2017 Target Resolution Date: 01/12/2018 Goal Status: Active Ulcer/skin breakdown will have a volume reduction of 30% by week 4 Date Initiated: 12/16/2017 Target Resolution Date: 01/12/2018 Goal Status: Active Interventions: Assess patient/caregiver ability to obtain necessary supplies Assess patient/caregiver ability to perform ulcer/skin care regimen upon admission and as needed Assess ulceration(s) every visit Treatment Activities: Skin care regimen initiated : 12/16/2017 Notes: Electronic  Signature(s) Signed: 01/07/2018 5:03:55 PM By: Donald Pore, [name] ([id number]) Entered By: [name] on 01/07/2018 12:49:05 [name], [name] ([id number]) -------------------------------------------------------------------------------- Pain Assessment Details Patient Name: [name] Date of Service: 01/07/2018 12:30 PM Medical Record Number: [id number] Patient Account Number: [id number] Date of Birth/Sex: 09-06-1945 (72 y.o. M) Treating RN: [name] Primary Care Laymond Postle: [name] Other Clinician: Referring Yoandri Congrove: [name] Treating Lekeisha Arenas/Extender: [name] Weeks in Treatment: 3 Active Problems Location of Pain Severity and Description of Pain Patient Has Paino No Site Locations With Dressing Change: No Pain Management and Medication Current Pain Management: Electronic Signature(s) Signed: 01/07/2018 4:14:49 PM By: Brandon Cool, BSN, RN, CWS, Kim RN, BSN Entered By: [name] on 01/07/2018 12:35:37 [name] ([id number]) -------------------------------------------------------------------------------- Patient/Caregiver Education Details Patient Name: [name] Date of Service: 01/07/2018 12:30 PM Medical Record Number: [id number] Patient Account Number: [id number] Date of Birth/Gender: July 16, 1945 (72 y.o. M) Treating RN: [name] Primary Care Physician: [name] Other Clinician: Referring Physician: Yaakov Barajas Treating Physician/Extender: Sharalyn Ink in Treatment: 3 Education Assessment Education Provided To: Patient Education Topics Provided Wound/Skin Impairment: Handouts: Other: wound care as ordered Methods: Explain/Verbal Responses: State content correctly Electronic Signature(s) Signed: 01/07/2018 5:03:55 PM By: Marjory Lies,  Di Kindle Entered By: [name] on 01/07/2018 13:07:34 [name], [name]  ([id number]) -------------------------------------------------------------------------------- Wound Assessment Details Patient Name: [name], [name]. Date of Service: 01/07/2018 12:30 PM Medical Record Number: [id number] Patient Account Number: [id number] Date of Birth/Sex: 08/22/1945 (72 y.o. M) Treating RN: [name] Primary Care Juliette Standre: [name] Other Clinician: Referring Jermell Holeman: [name] Treating Temitayo Covalt/Extender: [name] Weeks in Treatment: 3 Wound Status Wound Number: 1 Primary Etiology: Diabetic Wound/Ulcer of the Lower Extremity Wound Location: Right Foot - Dorsal Secondary Open Surgical Wound Wounding Event: Surgical Injury Etiology: Date Acquired: 10/30/2017 Wound Status: Open Weeks Of Treatment: 3 Comorbid Coronary Artery Disease, Hypertension, Clustered Wound: No History: Type II Diabetes Photos Photo Uploaded By: Sharon Mt on 01/08/2018 11:14:58 Wound Measurements Length: (cm) 1.5 % Reducti Width: (cm) 0.5 % Reducti Depth: (cm) 0.8 Epithelia Area: (cm) 0.589 Tunnelin Volume: (cm) 0.471 Undermin on in Area: -4.2% on in Volume: 48% lization: None g: No ing: No Wound Description Classification: Grade 1 Foul Odo Wound Margin: Flat and Intact Slough/F Exudate Amount: Medium Exudate Type: Serous Exudate Color: amber r After Cleansing: No ibrino Yes Wound Bed Granulation Amount: Small (1-33%) Exposed Structure Granulation Quality: Pink Fascia Exposed: No Necrotic Amount: Large (67-100%) Fat Layer (Subcutaneous Tissue) Exposed: Yes Necrotic Quality: Eschar, Adherent Slough Tendon Exposed: No Muscle Exposed: No Joint Exposed: No Bone Exposed: No Periwound Skin Texture Ungar, Brandon G. ([id number]) Texture Color No Abnormalities Noted: No No Abnormalities Noted: No Callus: No Atrophie Blanche: No Crepitus: No Cyanosis: No Excoriation: No Ecchymosis: No Induration: No Erythema: No Rash: No Hemosiderin Staining:  No Scarring: No Mottled: No Pallor: No Moisture Rubor: No No Abnormalities Noted: No Dry / Scaly: No Temperature / Pain Maceration: No Temperature: No Abnormality Wound Preparation Ulcer Cleansing: Rinsed/Irrigated with Saline Topical Anesthetic Applied: Other: lidocaine 4%, Treatment Notes Wound #1 (Right, Dorsal Foot) 1. Cleansed with: Clean wound with Normal Saline 2. Anesthetic Topical Lidocaine 4% cream to wound bed prior to debridement 4. Dressing Applied: Calcium Alginate with Silver 5. Secondary Dressing Applied Bordered Foam Dressing Notes grafix pl applied in clinic today with mepitel and steri strips, silvercel on top and BFD Electronic Signature(s) Signed: 01/07/2018 4:14:49 PM By: Brandon Cool, BSN, RN, CWS, Kim RN, BSN Entered By: [name] on 01/07/2018 12:40:31 [name] ([id number]) -------------------------------------------------------------------------------- [place] Details Patient Name: [name] Date of Service: 01/07/2018 12:30 PM Medical Record Number: [id number] Patient Account Number: [id number] Date of Birth/Sex: [birth year] (72 y.o. M) Treating RN: [name] Primary Care Siboney Requejo: [name] Other Clinician: Referring Bristyn Kulesza: [name] Treating Terre Hanneman/Extender: [name] Weeks in Treatment: 3 Vital Signs Time Taken: 12:35 Temperature (F): 98.3 Height (in): 77 Pulse (bpm): 79 Weight (lbs): 240 Respiratory Rate (breaths/min): 16 Body Mass Index (BMI): 28.5 Blood Pressure (mmHg): 137/69 Reference Range: 80 - 120 mg / dl Electronic Signature(s) Signed: 01/07/2018 4:14:49 PM By: Brandon Cool, BSN, RN, CWS, Kim RN, BSN Entered By: [name] on 01/07/2018 12:36:30

## 2018-01-10 NOTE — Progress Notes (Signed)
[name] ([id number]) Visit Report for 01/07/2018 Chief Complaint Document Details Patient Name: [name], [name]. Date of Service: 01/07/2018 12:30 PM Medical Record Number: [id number] Patient Account Number: [id number] Date of Birth/Sex: 1945/09/18 (72 y.o. M) Treating RN: [name] Primary Care Provider: Yaakov Barajas Other Clinician: Referring Provider: Yaakov Barajas Treating Provider/Extender: [name] Weeks in Treatment: 3 Information Obtained from: Patient Chief Complaint Right foot surgical wound Electronic Signature(s) Signed: 01/09/2018 1:45:00 AM By: [name] Entered By: Brandon Keeler on 01/07/2018 12:43:39 Viele, [name] ([id number]) -------------------------------------------------------------------------------- Cellular or Tissue Based Product Details Patient Name: [name] Date of Service: 01/07/2018 12:30 PM Medical Record Number: [id number] Patient Account Number: [id number] Date of Birth/Sex: 16-Jun-1945 (72 y.o. M) Treating RN: [name] Primary Care Provider: Yaakov Barajas Other Clinician: Referring Provider: Yaakov Barajas Treating Provider/Extender: [name] Weeks in Treatment: 3 Cellular or Tissue Based Wound #1 Right,Dorsal Foot Product Type Applied to: Performed By: Physician [name] Cellular or Tissue Based Other Product Type: Level of Consciousness (Pre- Awake and Alert procedure): Pre-procedure Verification/Time Yes - 12:56 Out Taken: Location: genitalia / hands / feet / multiple digits Wound Size (sq cm): 0.75 Product Size (sq cm): 6 Waste Size (sq cm): 5 Waste Reason: wound size Amount of Product Applied (sq cm): 1 Instrument Used: Forceps, Scissors Lot #: [id number] Expiration Date: 08/22/2018 Fenestrated: No Reconstituted: Yes Solution Type: normal saline Solution Amount: 64ml Lot #: [id number] Solution Expiration Date: 06/18/2019 Secured: Yes Secured With:  Steri-Strips Dressing Applied: Yes Primary Dressing: mepitel one Procedural Pain: 0 Post Procedural Pain: 0 Response to Treatment: Procedure was tolerated well Level of Consciousness (Post- Awake and Alert procedure): Post Procedure Diagnosis Same as Pre-procedure Electronic Signature(s) Signed: 01/07/2018 5:03:55 PM By: [name] Entered By: [name] on 01/07/2018 12:58:32 Barajas, [name] ([id number]) -------------------------------------------------------------------------------- Debridement Details Patient Name: [name] Date of Service: 01/07/2018 12:30 PM Medical Record Number: [id number] Patient Account Number: [id number] Date of Birth/Sex: 1945-12-22 (72 y.o. M) Treating RN: [name] Primary Care Provider: Yaakov Barajas Other Clinician: Referring Provider: Yaakov Barajas Treating Provider/Extender: [name] Weeks in Treatment: 3 Debridement Performed for Wound #1 Right,Dorsal Foot Assessment: Performed By: Physician [name] Debridement Type: Debridement Severity of Tissue Pre Fat layer exposed Debridement: Level of Consciousness (Pre- Awake and Alert procedure): Pre-procedure Verification/Time Yes - 12:48 Out Taken: Start Time: 12:48 Pain Control: Lidocaine 4% Topical Solution Total Area Debrided (L x W): 1.5 (cm) x 0.5 (cm) = 0.75 (cm) Tissue and other material Viable, Non-Viable, Slough, Subcutaneous, Slough debrided: Level: Skin/Subcutaneous Tissue Debridement Description: Excisional Instrument: Curette Bleeding: Minimum Hemostasis Achieved: Pressure End Time: 12:51 Procedural Pain: 0 Post Procedural Pain: 0 Response to Treatment: Procedure was tolerated well Level of Consciousness Awake and Alert (Post-procedure): Post Debridement Measurements of Total Wound Length: (cm) 1.5 Width: (cm) 0.5 Depth: (cm) 0.9 Volume: (cm) 0.53 Character of Wound/Ulcer Post Debridement: Improved Severity of Tissue  Post Debridement: Fat layer exposed Post Procedure Diagnosis Same as Pre-procedure Electronic Signature(s) Signed: 01/07/2018 5:03:55 PM By: [name] Signed: 01/09/2018 1:45:00 AM By: [name] Entered By: [name] on 01/07/2018 12:52:03 [name] ([id number]) -------------------------------------------------------------------------------- HPI Details Patient Name: [name] Date of Service: 01/07/2018 12:30 PM Medical Record Number: [id number] Patient Account Number: [id number] Date of Birth/Sex: 11/11/1945 (72 y.o. M) Treating RN: [name] Primary Care Provider: Yaakov Barajas Other Clinician: Referring Provider: Yaakov Barajas Treating Provider/Extender: [name]  Weeks in Treatment: 3 History of Present Illness HPI Description: 12/16/17 on evaluation today patient presents concerning an issue that he had which began roughly 2 years ago which included pain with walking and having had injections previously along with inserts which he obtained from the [facility]. Unfortunately throughout all this he was not getting significantly. 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 Treatment options were discussed included conservative versus surgical. The patient proceed with surgical intervention at that time. Subsequently the patient ended up undergoing surgery apparently on 10/31/17 although I do not see the actual surgical note when I look for epic. Subsequently he was seen back for reevaluation on 11/06/17 and since that time has been undergoing dressing changes at [name] office. With that being said wound care has been actually performed in their office on a regular basis since then he's been going in getting the dressings changed and rewrap and subsequently he had a significant issue where this became inflamed/infected and he was  placed on Cipro 500 mg. He actually is finishing that up today and in fact the foot itself actually appears to be doing well as far as I'm concerned. Nonetheless they have been performing good wound care according to what I'm seeing in the clinic where they were performing all of his dressing changes. Subsequently that has been from October 31, 2017 through current when I evaluated him today. The patient's wound culture which was obtained and completed did show Staphylococcus aureus and E. coli both sensitive to Cipro which is why this was utilize. The patient today states that he really wants to try to get this thing closed as quickly as possible. Patient has a history of diabetes mellitus type II with a hemoglobin A1c on 10/15/17 of 8.4. He also has a history of hypertension which is fairly well controlled. 12/23/17 on evaluation today patient actually appears to be doing better in regard to his foot ulcer. The overall appearance of the wound bed is dramatically improved compared to previous. He does not appear to have had any issues with bleeding after the chemical cauterization last week. Overall I feel like he's making good signs of improvement we have gotten approval for the Grafix PL. 01/07/18 but evaluation today patient actually appears to be doing better in regard to the wound bed at this point which is good news. There still is tendon exposed in the base of the wound but fortunately he seems to be making some signs of progress. This is good and I do believe the wound bed is ready for the application of the Grafix PL at this point. He has been tolerating the dressing's at home without complication and he is been doing excellent job taking care of this in my pinion. Electronic Signature(s) Signed: 01/09/2018 1:45:00 AM By: [name] Entered By: Brandon Keeler on 01/07/2018 13:11:51 Harpenau, [name]  ([id number]) -------------------------------------------------------------------------------- Physical Exam Details Patient Name: EUEL, CASTILE Date of Service: 01/07/2018 12:30 PM Medical Record Number: [id number] Patient Account Number: [id number] Date of Birth/Sex: 10/12/1945 (72 y.o. M) Treating RN: [name] Primary Care Provider: Yaakov Barajas Other Clinician: Referring Provider: Yaakov Barajas Treating Provider/Extender: Brandon III, Barajas Weeks in Treatment: 3 Constitutional Well-nourished and well-hydrated in no acute distress. Respiratory normal breathing without difficulty. Psychiatric this patient is able to make decisions and demonstrates good insight into disease process. Alert and Oriented  x 3. pleasant and cooperative. Notes Patient's wound bed currently did require sharp debridement to prepare for the application of the Grafix PL. He tolerated this today without complication post debridement the wound bed appears to be doing much better. Fortunately there is no significant bleeding. I did therefore apply the Grafix PL I'd used a plane packing strip to hold this in place in the base of the wound in contact with the wound bed. I think this is going to help it more than anything to achieve what we want which is staying in contact with the wound bed and allow for the most appropriate healing at this point. He tolerated all this without complication today. Electronic Signature(s) Signed: 01/09/2018 1:45:00 AM By: [name] Entered By: Brandon Keeler on 01/07/2018 13:13:58 Mccall, [name] ([id number]) -------------------------------------------------------------------------------- Physician Orders Details Patient Name: [name] Date of Service: 01/07/2018 12:30 PM Medical Record Number: [id number] Patient Account Number: [id number] Date of Birth/Sex: 12-Dec-1945 (72 y.o. M) Treating RN: [name] Primary Care Provider: Yaakov Barajas Other  Clinician: Referring Provider: Yaakov Barajas Treating Provider/Extender: [name] Weeks in Treatment: 3 Verbal / Phone Orders: No Diagnosis Coding ICD-10 Coding Code Description T81.31XA Disruption of external operation (surgical) wound, not elsewhere classified, initial encounter E11.621 Type 2 diabetes mellitus with foot ulcer L97.512 Non-pressure chronic ulcer of other part of right foot with fat layer exposed I10 Essential (primary) hypertension Wound Cleansing Wound #1 Right,Dorsal Foot o Clean wound with Normal Saline. Anesthetic (add to Medication List) Wound #1 Right,Dorsal Foot o Topical Lidocaine 4% cream applied to wound bed prior to debridement (In Clinic Only). Skin Barriers/Peri-Wound Care Wound #1 Right,Dorsal Foot o Skin Prep Primary Wound Dressing Wound #1 Right,Dorsal Foot o Silver Alginate - SilverCel Secondary Dressing Wound #1 Right,Dorsal Foot o Boardered Foam Dressing Dressing Change Frequency Wound #1 Right,Dorsal Foot o Change dressing every other day. Follow-up Appointments Wound #1 Right,Dorsal Foot o Return Appointment in 1 week. Advanced Therapies Wound #1 Right,Dorsal Foot o Grafix PL application in clinic; including contact layer, fixation with steri strips, dry gauze and cover dressing. [name], [name] ([id number]) Electronic Signature(s) Signed: 01/07/2018 5:03:55 PM By: [name] Signed: 01/09/2018 1:45:00 AM By: [name] Entered By: [name] on 01/07/2018 13:03:36 Burda, [name] ([id number]) -------------------------------------------------------------------------------- Problem List Details Patient Name: KIRK, BASQUEZ Date of Service: 01/07/2018 12:30 PM Medical Record Number: [id number] Patient Account Number: [id number] Date of Birth/Sex: March 31, 1945 (72 y.o. M) Treating RN: [name] Primary Care Provider: Yaakov Barajas Other Clinician: Referring Provider: Yaakov Barajas Treating Provider/Extender: [name] Weeks in Treatment: 3 Active Problems ICD-10 Evaluated Encounter Code Description Active Date Today Diagnosis T81.31XA Disruption of external operation (surgical) wound, not 12/16/2017 No Yes elsewhere classified, initial encounter E11.621 Type 2 diabetes mellitus with foot ulcer 12/16/2017 No Yes L97.512 Non-pressure chronic ulcer of other part of right foot with fat 12/16/2017 No Yes layer exposed I10 Essential (primary) hypertension 12/16/2017 No Yes Inactive Problems Resolved Problems Electronic Signature(s) Signed: 01/09/2018 1:45:00 AM By: [name] Entered By: Brandon Keeler on 01/07/2018 12:43:34 Flesch, [name] ([id number]) -------------------------------------------------------------------------------- Progress Note Details Patient Name: [name] Date of Service: 01/07/2018 12:30 PM Medical Record Number: [id number] Patient Account Number: [id number] Date of Birth/Sex: 11-Jul-1945 (72 y.o. M) Treating RN: [name] Primary Care Provider: Yaakov Barajas Other Clinician: Referring Provider: Yaakov Barajas Treating Provider/Extender: [name] Weeks in Treatment: 3 Subjective Chief Complaint Information obtained from Patient Right  foot surgical wound History of Present Illness (HPI) 12/16/17 on evaluation today patient presents concerning an issue that he had which began roughly 2 years ago which included pain with walking and having had injections previously along with inserts which he obtained from the [facility]. Unfortunately throughout all this he was not getting significantly. Therefore he did present to [name] who is a local podiatrist who diagnosed him with a Morton s Neuroma of the second interspace right foot. Treatment options were discussed included conservative versus surgical. The patient proceed with surgical intervention at that time. Subsequently the patient ended up  undergoing surgery apparently on 10/31/17 although I do not see the actual surgical note when I look for epic. Subsequently he was seen back for reevaluation on 11/06/17 and since that time has been undergoing dressing changes at [name] office. With that being said wound care has been actually performed in their office on a regular basis since then he's been going in getting the dressings changed and rewrap and subsequently he had a significant issue where this became inflamed/infected and he was placed on Cipro 500 mg. He actually is finishing that up today and in fact the foot itself actually appears to be doing well as far as I'm concerned. Nonetheless they have been performing good wound care according to what I'm seeing in the clinic where they were performing all of his dressing changes. Subsequently that has been from October 31, 2017 through current when I evaluated him today. The patient's wound culture which was obtained and completed did show Staphylococcus aureus and E. coli both sensitive to Cipro which is why this was utilize. The patient today states that he really wants to try to get this thing closed as quickly as possible. Patient has a history of diabetes mellitus type II with a hemoglobin A1c on 10/15/17 of 8.4. He also has a history of hypertension which is fairly well controlled. 12/23/17 on evaluation today patient actually appears to be doing better in regard to his foot ulcer. The overall appearance of the wound bed is dramatically improved compared to previous. He does not appear to have had any issues with bleeding after the chemical cauterization last week. Overall I feel like he's making good signs of improvement we have gotten approval for the Grafix PL. 01/07/18 but evaluation today patient actually appears to be doing better in regard to the wound bed at this point which is good news. There still is tendon exposed in the base of the wound but fortunately he seems to be  making some signs of progress. This is good and I do believe the wound bed is ready for the application of the Grafix PL at this point. He has been tolerating the dressing's at home without complication and he is been doing excellent job taking care of this in my pinion. Patient History Information obtained from Patient. Family History Cancer - Mother,Father, Diabetes - Paternal Grandparents, Lung Disease - Mother, Stroke - Father, No family history of Heart Disease, Hereditary Spherocytosis, Hypertension, Kidney Disease, Seizures, Thyroid Problems, Tuberculosis. Social History Never smoker, Marital Status - Married, Alcohol Use - Never, Drug Use - No History, Caffeine Use - Moderate. Medical And Surgical History Notes Cardiovascular [name], [name] ([id number]) HLD Review of Systems (ROS) Constitutional Symptoms (General Health) Denies complaints or symptoms of Fever, Chills. Respiratory The patient has no complaints or symptoms. Cardiovascular The patient has no complaints or symptoms. Psychiatric The patient has no complaints or symptoms. Objective  Constitutional Well-nourished and well-hydrated in no acute distress. Vitals Time Taken: 12:35 PM, Height: 77 in, Weight: 240 lbs, BMI: 28.5, Temperature: 98.3 F, Pulse: 79 bpm, Respiratory Rate: 16 breaths/min, Blood Pressure: 137/69 mmHg. Respiratory normal breathing without difficulty. Psychiatric this patient is able to make decisions and demonstrates good insight into disease process. Alert and Oriented x 3. pleasant and cooperative. General Notes: Patient's wound bed currently did require sharp debridement to prepare for the application of the Grafix PL. He tolerated this today without complication post debridement the wound bed appears to be doing much better. Fortunately there is no significant bleeding. I did therefore apply the Grafix PL I'd used a plane packing strip to hold this in place in the base of the wound in  contact with the wound bed. I think this is going to help it more than anything to achieve what we want which is staying in contact with the wound bed and allow for the most appropriate healing at this point. He tolerated all this without complication today. Integumentary (Hair, Skin) Wound #1 status is Open. Original cause of wound was Surgical Injury. The wound is located on the Right,Dorsal Foot. The wound measures 1.5cm length x 0.5cm width x 0.8cm depth; 0.589cm^2 area and 0.471cm^3 volume. There is Fat Layer (Subcutaneous Tissue) Exposed exposed. There is no tunneling or undermining noted. There is a medium amount of serous drainage noted. The wound margin is flat and intact. There is small (1-33%) pink granulation within the wound bed. There is a large (67-100%) amount of necrotic tissue within the wound bed including Eschar and Adherent Slough. The periwound skin appearance did not exhibit: Callus, Crepitus, Excoriation, Induration, Rash, Scarring, Dry/Scaly, Maceration, Atrophie Blanche, Cyanosis, Ecchymosis, Hemosiderin Staining, Mottled, Pallor, Rubor, Erythema. Periwound temperature was noted as No Abnormality. [name], [name] ([id number]) Assessment Active Problems ICD-10 Disruption of external operation (surgical) wound, not elsewhere classified, initial encounter Type 2 diabetes mellitus with foot ulcer Non-pressure chronic ulcer of other part of right foot with fat layer exposed Essential (primary) hypertension Procedures Wound #1 Pre-procedure diagnosis of Wound #1 is a Diabetic Wound/Ulcer of the Lower Extremity located on the Right,Dorsal Foot .Severity of Tissue Pre Debridement is: Fat layer exposed. There was a Excisional Skin/Subcutaneous Tissue Debridement with a total area of 0.75 sq cm performed by [name]. With the following instrument(s): Curette to remove Viable and Non-Viable tissue/material. Material removed includes Subcutaneous Tissue and  Slough and after achieving pain control using Lidocaine 4% Topical Solution. No specimens were taken. A time out was conducted at 12:48, prior to the start of the procedure. A Minimum amount of bleeding was controlled with Pressure. The procedure was tolerated well with a pain level of 0 throughout and a pain level of 0 following the procedure. Post Debridement Measurements: 1.5cm length x 0.5cm width x 0.9cm depth; 0.53cm^3 volume. Character of Wound/Ulcer Post Debridement is improved. Severity of Tissue Post Debridement is: Fat layer exposed. Post procedure Diagnosis Wound #1: Same as Pre-Procedure Pre-procedure diagnosis of Wound #1 is a Diabetic Wound/Ulcer of the Lower Extremity located on the Right,Dorsal Foot. A skin graft procedure using a bioengineered skin substitute/cellular or tissue based product was performed by [name] with the following instrument(s): Forceps and Scissors. Other was applied and secured with Steri-Strips. 1 sq cm of product was utilized and 5 sq cm was wasted due to wound size. Post Application, mepitel one was applied. A Time Out was conducted at 12:56, prior  to the start of the procedure. The procedure was tolerated well with a pain level of 0 throughout and a pain level of 0 following the procedure. Post procedure Diagnosis Wound #1: Same as Pre-Procedure . Plan Wound Cleansing: Wound #1 Right,Dorsal Foot: Clean wound with Normal Saline. Anesthetic (add to Medication List): Wound #1 Right,Dorsal Foot: Topical Lidocaine 4% cream applied to wound bed prior to debridement (In Clinic Only). Skin Barriers/Peri-Wound Care: Wound #1 Right,Dorsal Foot: Skin Prep Primary Wound Dressing: Wound #1 Right,Dorsal Foot: Silver Alginate - SilverCel Secondary Dressing: [name], [name] ([id number]) Wound #1 Right,Dorsal Foot: Boardered Foam Dressing Dressing Change Frequency: Wound #1 Right,Dorsal Foot: Change dressing every other day. Follow-up  Appointments: Wound #1 Right,Dorsal Foot: Return Appointment in 1 week. Advanced Therapies: Wound #1 Right,Dorsal Foot: Grafix PL application in clinic; including contact layer, fixation with steri strips, dry gauze and cover dressing. I'm gonna suggest that we continue with the above wound care measures for the next week. He is in agreement the plan. If anything changes or worsens in the meantime he will contact the office and let us know. Otherwise I will see him at that time hopefully the wound will be looking even better at that point. Please see above for specific wound care orders. We will see patient for re-evaluation in 1 week(s) here in the clinic. If anything worsens or changes patient will contact our office for additional recommendations. Electronic Signature(s) Signed: 01/09/2018 1:45:00 AM By: [name] Entered By: Brandon Keeler on 01/07/2018 13:14:23 Hoaglund, [name] ([id number]) -------------------------------------------------------------------------------- ROS/PFSH Details Patient Name: [name] Date of Service: 01/07/2018 12:30 PM Medical Record Number: [id number] Patient Account Number: [id number] Date of Birth/Sex: 1945-06-05 (72 y.o. M) Treating RN: [name] Primary Care Provider: Yaakov Barajas Other Clinician: Referring Provider: Yaakov Barajas Treating Provider/Extender: [name] Weeks in Treatment: 3 Information Obtained From Patient Wound History Do you currently have one or more open woundso Yes How many open wounds do you currently haveo 1 Approximately how long have you had your woundso 6 weeks How have you been treating your wound(s) until nowo bandage by podiatry Has your wound(s) ever healed and then re-openedo No Have you had any lab work done in the past montho No Have you tested positive for an antibiotic resistant organism (MRSA, VRE)o No Have you tested positive for osteomyelitis (bone infection)o No Have you had  any tests for circulation on your legso No Constitutional Symptoms (General Health) Complaints and Symptoms: Negative for: Fever; Chills Eyes Medical History: Negative for: Cataracts; Glaucoma; Optic Neuritis Ear/Nose/Mouth/Throat Medical History: Negative for: Chronic sinus problems/congestion; Middle ear problems Hematologic/Lymphatic Medical History: Negative for: Anemia; Hemophilia; Human Immunodeficiency Virus; Lymphedema; Sickle Cell Disease Respiratory Complaints and Symptoms: No Complaints or Symptoms Medical History: Negative for: Aspiration; Asthma; Chronic Obstructive Pulmonary Disease (COPD); Pneumothorax; Sleep Apnea; Tuberculosis Cardiovascular Complaints and Symptoms: No Complaints or Symptoms Medical History: Positive for: Coronary Artery Disease; Hypertension Negative for: Angina; Arrhythmia; Congestive Heart Failure; Deep Vein Thrombosis; Hypotension; Myocardial Infarction; [name], Brandon G. ([id number]) Peripheral Arterial Disease; Peripheral Venous Disease; Phlebitis; Vasculitis Past Medical History Notes: HLD Gastrointestinal Medical History: Negative for: Cirrhosis ; Colitis; Crohnos; Hepatitis A; Hepatitis B; Hepatitis C Endocrine Medical History: Positive for: Type II Diabetes Negative for: Type I Diabetes Treated with: Oral agents Genitourinary Medical History: Negative for: End Stage Renal Disease Immunological Medical History: Negative for: Lupus Erythematosus; Raynaudos; Scleroderma Integumentary (Skin) Medical History: Negative for: History of Burn; History of pressure wounds Musculoskeletal Medical History:  Negative for: Gout; Rheumatoid Arthritis; Osteoarthritis; Osteomyelitis Neurologic Medical History: Negative for: Dementia; Neuropathy; Quadriplegia; Paraplegia; Seizure Disorder Oncologic Medical History: Negative for: Received Chemotherapy; Received Radiation Psychiatric Complaints and Symptoms: No Complaints or  Symptoms Medical History: Negative for: Anorexia/bulimia; Confinement Anxiety Immunizations Pneumococcal Vaccine: Received Pneumococcal Vaccination: Yes [name], [name] ([id number]) Implantable Devices Family and Social History Cancer: Yes - Mother,Father; Diabetes: Yes - Paternal Grandparents; Heart Disease: No; Hereditary Spherocytosis: No; Hypertension: No; Kidney Disease: No; Lung Disease: Yes - Mother; Seizures: No; Stroke: Yes - Father; Thyroid Problems: No; Tuberculosis: No; Never smoker; Marital Status - Married; Alcohol Use: Never; Drug Use: No History; Caffeine Use: Moderate; Financial Concerns: No; Food, Clothing or Shelter Needs: No; Support System Lacking: No; Transportation Concerns: No; Advanced Directives: No; Patient does not want information on Advanced Directives Physician Affirmation I have reviewed and agree with the above information. Electronic Signature(s) Signed: 01/07/2018 5:03:55 PM By: [name] Signed: 01/09/2018 1:45:00 AM By: [name] Entered By: Brandon Keeler on 01/07/2018 13:12:15 Klebba, [name] ([id number]) -------------------------------------------------------------------------------- SuperBill Details Patient Name: [name] Date of Service: 01/07/2018 Medical Record Number: [id number] Patient Account Number: [id number] Date of Birth/Sex: 02-Jul-1945 (72 y.o. M) Treating RN: [name] Primary Care Provider: Yaakov Barajas Other Clinician: Referring Provider: Yaakov Barajas Treating Provider/Extender: [name] Weeks in Treatment: 3 Diagnosis Coding ICD-10 Codes Code Description T81.31XA Disruption of external operation (surgical) wound, not elsewhere classified, initial encounter E11.621 Type 2 diabetes mellitus with foot ulcer L97.512 Non-pressure chronic ulcer of other part of right foot with fat layer exposed I10 Essential (primary) hypertension Facility Procedures CPT4: Description Modifier Quantity Code  18299371 15275 - SKIN SUB GRAFT FACE/NK/HF/G 1 ICD-10 Diagnosis Description T81.31XA Disruption of external operation (surgical) wound, not elsewhere classified, initial encounter E11.621 Type 2 diabetes mellitus  with foot ulcer L97.512 Non-pressure chronic ulcer of other part of right foot with fat layer exposed CPT4: [id number] Q4133 Grafix PL 2x3 CM 6 Physician Procedures CPT4: Description Modifier Quantity Code 1017510 25852 - WC PHYS SKIN SUB GRAFT FACE/NK/HF/G 1 ICD-10 Diagnosis Description T81.31XA Disruption of external operation (surgical) wound, not elsewhere classified, initial encounter E11.621 Type 2 diabetes  mellitus with foot ulcer L97.512 Non-pressure chronic ulcer of other part of right foot with fat layer exposed Electronic Signature(s) Signed: 01/09/2018 1:45:00 AM By: [name] Entered By: Brandon Keeler on 01/07/2018 13:14:31

## 2018-01-13 ENCOUNTER — Encounter: Payer: Medicare Other | Admitting: Physician Assistant

## 2018-01-13 DIAGNOSIS — E785 Hyperlipidemia, unspecified: Secondary | ICD-10-CM | POA: Diagnosis not present

## 2018-01-13 DIAGNOSIS — T8189XA Other complications of procedures, not elsewhere classified, initial encounter: Secondary | ICD-10-CM | POA: Diagnosis not present

## 2018-01-13 DIAGNOSIS — I110 Hypertensive heart disease with heart failure: Secondary | ICD-10-CM | POA: Diagnosis not present

## 2018-01-13 DIAGNOSIS — L97511 Non-pressure chronic ulcer of other part of right foot limited to breakdown of skin: Secondary | ICD-10-CM | POA: Diagnosis not present

## 2018-01-13 DIAGNOSIS — I2510 Atherosclerotic heart disease of native coronary artery without angina pectoris: Secondary | ICD-10-CM | POA: Diagnosis not present

## 2018-01-13 DIAGNOSIS — L97512 Non-pressure chronic ulcer of other part of right foot with fat layer exposed: Secondary | ICD-10-CM | POA: Diagnosis not present

## 2018-01-13 DIAGNOSIS — I509 Heart failure, unspecified: Secondary | ICD-10-CM | POA: Diagnosis not present

## 2018-01-13 DIAGNOSIS — E11621 Type 2 diabetes mellitus with foot ulcer: Secondary | ICD-10-CM | POA: Diagnosis not present

## 2018-01-14 NOTE — Progress Notes (Addendum)
NABOR, THOMANN ([id number]) Visit Report for 01/13/2018 Arrival Information Details Patient Name: [name], [name]. Date of Service: 01/13/2018 11:15 AM Medical Record Number: [id number] Patient Account Number: [id number] Date of Birth/Sex: [birth year] (72 y.o. M) Treating RN: Cornell Barman Primary Care Sairah Knobloch: Yaakov Guthrie Other Clinician: Referring Nicole Defino: Yaakov Guthrie Treating Yancy Knoble/Extender: Melburn Hake, HOYT Weeks in Treatment: 4 Visit Information History Since Last Visit Added or deleted any medications: No Patient Arrived: Ambulatory Any new allergies or adverse reactions: No Arrival Time: 11:15 Had a fall or experienced change in No Accompanied By: self activities of daily living that may affect Transfer Assistance: None risk of falls: Patient Identification Verified: Yes Signs or symptoms of abuse/neglect since last visito No Secondary Verification Process Yes Hospitalized since last visit: No Completed: Implantable device outside of the clinic excluding No Patient Has Alerts: Yes cellular tissue based products placed in the center Patient Alerts: DMII since last visit: ABI 12/24/17 L 1.2 R Has Dressing in Place as Prescribed: Yes 1.2 Pain Present Now: No Electronic Signature(s) Signed: 01/13/2018 3:33:32 PM By: Lorine Bears RCP, RRT, CHT Entered By: Lorine Bears on 01/13/2018 11:15:48 Deak, [name] ([id number]) -------------------------------------------------------------------------------- Encounter Discharge Information Details Patient Name: [name] Date of Service: 01/13/2018 11:15 AM Medical Record Number: [id number] Patient Account Number: [id number] Date of Birth/Sex: Oct 20, 1945 (72 y.o. M) Treating RN: Cornell Barman Primary Care Guliana Weyandt: Yaakov Guthrie Other Clinician: Referring Orion Vandervort: Yaakov Guthrie Treating Trica Usery/Extender: Melburn Hake, HOYT Weeks in Treatment: 4 Encounter Discharge Information Items Post  Procedure Vitals Discharge Condition: Stable Temperature (F): 98.1 Ambulatory Status: Ambulatory Pulse (bpm): 73 Discharge Destination: Home Respiratory Rate (breaths/min): 16 Transportation: Private Auto Blood Pressure (mmHg): 111/66 Accompanied By: self Schedule Follow-up Appointment: Yes Clinical Summary of Care: Electronic Signature(s) Signed: 01/13/2018 3:24:13 PM By: Gretta Cool, BSN, RN, CWS, Kim RN, BSN Entered By: Gretta Cool, BSN, RN, CWS, Kim on 01/13/2018 11:56:54 [name] ([id number]) -------------------------------------------------------------------------------- Lower Extremity Assessment Details Patient Name: [name] Date of Service: 01/13/2018 11:15 AM Medical Record Number: [id number] Patient Account Number: [id number] Date of Birth/Sex: 1945/07/04 (72 y.o. M) Treating RN: Secundino Ginger Primary Care Reeder Brisby: Yaakov Guthrie Other Clinician: Referring Ka Flammer: Yaakov Guthrie Treating Tommy Minichiello/Extender: Melburn Hake, HOYT Weeks in Treatment: 4 Vascular Assessment Claudication: Claudication Assessment [Right:None] Pulses: Dorsalis Pedis Palpable: [Right:Yes] Posterior Tibial Extremity colors, hair growth, and conditions: Extremity Color: [Left:Normal] [Right:Normal] Hair Growth on Extremity: [Left:No] [Right:No] Temperature of Extremity: [Right:Cool] Capillary Refill: [Right:< 3 seconds] Toe Nail Assessment Left: Right: Thick: Yes Discolored: No Deformed: No Improper Length and Hygiene: No Electronic Signature(s) Signed: 01/13/2018 3:39:15 PM By: Secundino Ginger Entered By: Secundino Ginger on 01/13/2018 11:32:58 Gavel, [name] ([id number]) -------------------------------------------------------------------------------- Multi Wound Chart Details Patient Name: [name] Date of Service: 01/13/2018 11:15 AM Medical Record Number: [id number] Patient Account Number: [id number] Date of Birth/Sex: 1945/08/10 (72 y.o. M) Treating RN: Montey Hora Primary Care  Mabry Tift: Yaakov Guthrie Other Clinician: Referring Aleksandr Pellow: Yaakov Guthrie Treating Gerianne Simonet/Extender: Melburn Hake, HOYT Weeks in Treatment: 4 Vital Signs Height(in): 77 Pulse(bpm): 73 Weight(lbs): 240 Blood Pressure(mmHg): 111/66 Body Mass Index(BMI): 28 Temperature(F): 98.1 Respiratory Rate 16 (breaths/min): Photos: [N/A:N/A] Wound Location: Right Foot - Dorsal N/A N/A Wounding Event: Surgical Injury N/A N/A Primary Etiology: Diabetic Wound/Ulcer of the N/A N/A Lower Extremity Secondary Etiology: Open Surgical Wound N/A N/A Comorbid History: Coronary Artery Disease, N/A N/A Hypertension, Type II Diabetes Date Acquired: 10/30/2017 N/A N/A Weeks of Treatment: 4 N/A N/A Wound Status: Open N/A N/A Measurements L x W x  D 1.3x0.5x0.6 N/A N/A (cm) Area (cm) : 0.511 N/A N/A Volume (cm) : 0.306 N/A N/A % Reduction in Area: 9.60% N/A N/A % Reduction in Volume: 66.20% N/A N/A Classification: Grade 1 N/A N/A Exudate Amount: Medium N/A N/A Exudate Type: Sanguinous N/A N/A Exudate Color: red N/A N/A Wound Margin: Flat and Intact N/A N/A Granulation Amount: Small (1-33%) N/A N/A Granulation Quality: Pink N/A N/A Necrotic Amount: Large (67-100%) N/A N/A Necrotic Tissue: Eschar, Adherent Slough N/A N/A Exposed Structures: Fat Layer (Subcutaneous N/A N/A Tissue) Exposed: Yes Fascia: No Tendon: No Fabela, Damere G. ([id number]) Muscle: No Joint: No Bone: No Epithelialization: None N/A N/A Periwound Skin Texture: Excoriation: No N/A N/A Induration: No Callus: No Crepitus: No Rash: No Scarring: No Periwound Skin Moisture: Maceration: No N/A N/A Dry/Scaly: No Periwound Skin Color: Atrophie Blanche: No N/A N/A Cyanosis: No Ecchymosis: No Erythema: No Hemosiderin Staining: No Mottled: No Pallor: No Rubor: No Temperature: No Abnormality N/A N/A Tenderness on Palpation: No N/A N/A Wound Preparation: Ulcer Cleansing: N/A N/A Rinsed/Irrigated with Saline Topical  Anesthetic Applied: Other: lidocaine 4% Treatment Notes Electronic Signature(s) Signed: 01/13/2018 3:24:13 PM By: Gretta Cool, BSN, RN, CWS, Kim RN, BSN Entered By: Gretta Cool, BSN, RN, CWS, Kim on 01/13/2018 11:46:56 [name] ([id number]) -------------------------------------------------------------------------------- Multi-Disciplinary Care Plan Details Patient Name: [name], [name]. Date of Service: 01/13/2018 11:15 AM Medical Record Number: [id number] Patient Account Number: [id number] Date of Birth/Sex: [birth year] (72 y.o. M) Treating RN: Montey Hora Primary Care Aften Lipsey: Yaakov Guthrie Other Clinician: Referring Kyrese Gartman: Yaakov Guthrie Treating Alianys Chacko/Extender: Melburn Hake, HOYT Weeks in Treatment: 4 Active Inactive ` Orientation to the Wound Care Program Nursing Diagnoses: Knowledge deficit related to the wound healing center program Goals: Patient/caregiver will verbalize understanding of the [facility] Program Date Initiated: 12/16/2017 Target Resolution Date: 01/06/2018 Goal Status: Active Interventions: Provide education on orientation to the wound center Notes: ` Wound/Skin Impairment Nursing Diagnoses: Impaired tissue integrity Goals: Patient/caregiver will verbalize understanding of skin care regimen Date Initiated: 12/16/2017 Target Resolution Date: 01/12/2018 Goal Status: Active Ulcer/skin breakdown will have a volume reduction of 30% by week 4 Date Initiated: 12/16/2017 Target Resolution Date: 01/12/2018 Goal Status: Active Interventions: Assess patient/caregiver ability to obtain necessary supplies Assess patient/caregiver ability to perform ulcer/skin care regimen upon admission and as needed Assess ulceration(s) every visit Treatment Activities: Skin care regimen initiated : 12/16/2017 Notes: Electronic Signature(s) Signed: 01/13/2018 3:24:13 PM By: Gretta Cool, BSN, RN, CWS, Kim RN, BSN Signed: 01/13/2018 5:02:27 PM By: Ursula Alert ([id number]) Entered By: Gretta Cool, BSN, RN, CWS, Kim on 01/13/2018 11:46:49 [place], [name] ([id number]) -------------------------------------------------------------------------------- Pain Assessment Details Patient Name: [name], [name]. Date of Service: 01/13/2018 11:15 AM Medical Record Number: [id number] Patient Account Number: [id number] Date of Birth/Sex: 20-Aug-1945 (72 y.o. M) Treating RN: Cornell Barman Primary Care Tyjon Bowen: Yaakov Guthrie Other Clinician: Referring Vidhi Delellis: Yaakov Guthrie Treating Shauntae Reitman/Extender: Melburn Hake, HOYT Weeks in Treatment: 4 Active Problems Location of Pain Severity and Description of Pain Patient Has Paino No Site Locations Pain Management and Medication Current Pain Management: Electronic Signature(s) Signed: 01/13/2018 3:24:13 PM By: Gretta Cool, BSN, RN, CWS, Kim RN, BSN Signed: 01/13/2018 3:33:32 PM By: Lorine Bears RCP, RRT, CHT Entered By: Lorine Bears on 01/13/2018 11:15:57 [place], [name] ([id number]) -------------------------------------------------------------------------------- Patient/Caregiver Education Details Patient Name: [name] Date of Service: 01/13/2018 11:15 AM Medical Record Number: [id number] Patient Account Number: [id number] Date of Birth/Gender: 03/09/1946 (72 y.o. M) Treating RN: Cornell Barman Primary Care Physician: Yaakov Guthrie Other Clinician:  Referring Physician: Yaakov Guthrie Treating Physician/Extender: Sharalyn Ink in Treatment: 4 Education Assessment Education Provided To: Patient Education Topics Provided Wound/Skin Impairment: Handouts: Caring for Your Ulcer, Other: Leave dressing in place for the week. Methods: Demonstration, Explain/Verbal Electronic Signature(s) Signed: 01/13/2018 3:24:13 PM By: Gretta Cool, BSN, RN, CWS, Kim RN, BSN Entered By: Gretta Cool, BSN, RN, CWS, Kim on 01/13/2018 11:57:02 [place], [name]  ([id number]) -------------------------------------------------------------------------------- Wound Assessment Details Patient Name: [name], [name] Date of Service: 01/13/2018 11:15 AM Medical Record Number: [id number] Patient Account Number: [id number] Date of Birth/Sex: [birth year] (72 y.o. M) Treating RN: Secundino Ginger Primary Care Doy Taaffe: Yaakov Guthrie Other Clinician: Referring Seynabou Fults: Yaakov Guthrie Treating Trinidy Masterson/Extender: Melburn Hake, HOYT Weeks in Treatment: 4 Wound Status Wound Number: 1 Primary Etiology: Diabetic Wound/Ulcer of the Lower Extremity Wound Location: Right Foot - Dorsal Secondary Open Surgical Wound Wounding Event: Surgical Injury Etiology: Date Acquired: 10/30/2017 Wound Status: Open Weeks Of Treatment: 4 Comorbid Coronary Artery Disease, Hypertension, Clustered Wound: No History: Type II Diabetes Photos Photo Uploaded By: Secundino Ginger on 01/13/2018 11:40:44 Wound Measurements Length: (cm) 1.3 % Reductio Width: (cm) 0.5 % Reductio Depth: (cm) 0.6 Epithelial Area: (cm) 0.511 Tunneling Volume: (cm) 0.306 Undermini n in Area: 9.6% n in Volume: 66.2% ization: None : No ng: No Wound Description Classification: Grade 1 Foul Odor Wound Margin: Flat and Intact Slough/Fi Exudate Amount: Medium Exudate Type: Sanguinous Exudate Color: red After Cleansing: No brino Yes Wound Bed Granulation Amount: Small (1-33%) Exposed Structure Granulation Quality: Pink Fascia Exposed: No Necrotic Amount: Large (67-100%) Fat Layer (Subcutaneous Tissue) Exposed: Yes Necrotic Quality: Eschar, Adherent Slough Tendon Exposed: No Muscle Exposed: No Joint Exposed: No Bone Exposed: No Periwound Skin Texture Murakami, Aureliano G. ([id number]) Texture Color No Abnormalities Noted: No No Abnormalities Noted: No Callus: No Atrophie Blanche: No Crepitus: No Cyanosis: No Excoriation: No Ecchymosis: No Induration: No Erythema: No Rash: No Hemosiderin Staining:  No Scarring: No Mottled: No Pallor: No Moisture Rubor: No No Abnormalities Noted: No Dry / Scaly: No Temperature / Pain Maceration: No Temperature: No Abnormality Wound Preparation Ulcer Cleansing: Rinsed/Irrigated with Saline Topical Anesthetic Applied: Other: lidocaine 4%, Treatment Notes Wound #1 (Right, Dorsal Foot) Notes grafix pl applied in clinic today with mepitel and steri strips, silvercel on top and BFD Electronic Signature(s) Signed: 01/13/2018 3:39:15 PM By: Secundino Ginger Entered By: Secundino Ginger on 01/13/2018 11:31:46 [place], [name] ([id number]) -------------------------------------------------------------------------------- Vitals Details Patient Name: [name] Date of Service: 01/13/2018 11:15 AM Medical Record Number: [id number] Patient Account Number: [id number] Date of Birth/Sex: 1946-03-04 (72 y.o. M) Treating RN: Cornell Barman Primary Care Clydette Privitera: Yaakov Guthrie Other Clinician: Referring Faiz Weber: Yaakov Guthrie Treating Serenity Batley/Extender: Melburn Hake, HOYT Weeks in Treatment: 4 Vital Signs Time Taken: 11:15 Temperature (F): 98.1 Height (in): 77 Pulse (bpm): 73 Weight (lbs): 240 Respiratory Rate (breaths/min): 16 Body Mass Index (BMI): 28.5 Blood Pressure (mmHg): 111/66 Reference Range: 80 - 120 mg / dl Electronic Signature(s) Signed: 01/13/2018 3:33:32 PM By: Lorine Bears RCP, RRT, CHT Entered By: Lorine Bears on 01/13/2018 11:17:54

## 2018-01-17 DIAGNOSIS — Z23 Encounter for immunization: Secondary | ICD-10-CM | POA: Diagnosis not present

## 2018-01-18 NOTE — Progress Notes (Signed)
[name] ([id number]) Visit Report for 01/13/2018 Chief Complaint Document Details Patient Name: [name], [name]. Date of Service: 01/13/2018 11:15 AM Medical Record Number: [id number] Patient Account Number: [id number] Date of Birth/Sex: 02-22-1946 (72 y.o. M) Treating RN: Cornell Barman Primary Care Provider: Yaakov Guthrie Other Clinician: Referring Provider: Yaakov Guthrie Treating Provider/Extender: Melburn Hake, Jhonny Calixto Weeks in Treatment: 4 Information Obtained from: Patient Chief Complaint Right foot surgical wound Electronic Signature(s) Signed: 01/15/2018 7:47:50 PM By: Worthy Keeler PA-C Entered By: Worthy Keeler on 01/15/2018 19:45:09 Hedglin, [name] ([id number]) -------------------------------------------------------------------------------- Cellular or Tissue Based Product Details Patient Name: [name] Date of Service: 01/13/2018 11:15 AM Medical Record Number: [id number] Patient Account Number: [id number] Date of Birth/Sex: November 17, 1945 (72 y.o. M) Treating RN: Cornell Barman Primary Care Provider: Yaakov Guthrie Other Clinician: Referring Provider: Yaakov Guthrie Treating Provider/Extender: Melburn Hake, Emalee Knies Weeks in Treatment: 4 Cellular or Tissue Based Wound #1 Right,Dorsal Foot Product Type Applied to: Performed By: Physician STONE III, Khani Paino E., PA-C Cellular or Tissue Based Other Product Type: Level of Consciousness (Pre- Awake and Alert procedure): Pre-procedure Verification/Time Yes - 11:48 Out Taken: Location: genitalia / hands / feet / multiple digits Wound Size (sq cm): 0.65 Product Size (sq cm): 6 Waste Size (sq cm): 0 Amount of Product Applied (sq cm): 6 Instrument Used: Forceps, Scissors Lot #: [id number] Expiration Date: 03/20/2019 Fenestrated: No Reconstituted: Yes Solution Type: nacl Solution Amount: 51ml Lot #: [id number] Solution Expiration Date: 06/18/2019 Secured: Yes Secured With: Steri-Strips Dressing Applied: Yes Primary Dressing: mepitel  one Procedural Pain: 0 Post Procedural Pain: 0 Response to Treatment: Procedure was tolerated well Level of Consciousness (Post- Awake and Alert procedure): Post Procedure Diagnosis Same as Pre-procedure Electronic Signature(s) Signed: 01/13/2018 3:24:13 PM By: Gretta Cool, BSN, RN, CWS, Kim RN, BSN Entered By: Gretta Cool, BSN, RN, CWS, Kim on 01/13/2018 11:51:57 [name], [name] ([id number]) -------------------------------------------------------------------------------- Debridement Details Patient Name: [name] Date of Service: 01/13/2018 11:15 AM Medical Record Number: [id number] Patient Account Number: [id number] Date of Birth/Sex: Sep 26, 1945 (72 y.o. M) Treating RN: Cornell Barman Primary Care Provider: Yaakov Guthrie Other Clinician: Referring Provider: Yaakov Guthrie Treating Provider/Extender: Melburn Hake, Ahana Najera Weeks in Treatment: 4 Debridement Performed for Wound #1 Right,Dorsal Foot Assessment: Performed By: Physician STONE III, Ricquel Foulk E., PA-C Debridement Type: Debridement Severity of Tissue Pre Fat layer exposed Debridement: Level of Consciousness (Pre- Awake and Alert procedure): Pre-procedure Verification/Time Yes - 11:45 Out Taken: Start Time: 11:45 Pain Control: Lidocaine Total Area Debrided (L x W): 1.3 (cm) x 0.5 (cm) = 0.65 (cm) Tissue and other material Viable, Non-Viable, Slough, Subcutaneous, Slough debrided: Level: Skin/Subcutaneous Tissue Debridement Description: Excisional Instrument: Curette Bleeding: Minimum Hemostasis Achieved: Pressure End Time: 11:47 Response to Treatment: Procedure was tolerated well Level of Consciousness Awake and Alert (Post-procedure): Post Debridement Measurements of Total Wound Length: (cm) 1.3 Width: (cm) 0.5 Depth: (cm) 0.6 Volume: (cm) 0.306 Character of Wound/Ulcer Post Debridement: Stable Severity of Tissue Post Debridement: Limited to breakdown of skin Post Procedure Diagnosis Same as  Pre-procedure Electronic Signature(s) Signed: 01/13/2018 3:24:13 PM By: Gretta Cool, BSN, RN, CWS, Kim RN, BSN Signed: 01/15/2018 7:47:50 PM By: Worthy Keeler PA-C Entered By: Gretta Cool, BSN, RN, CWS, Kim on 01/13/2018 11:48:00 [name], [name] ([id number]) -------------------------------------------------------------------------------- HPI Details Patient Name: [name] Date of Service: 01/13/2018 11:15 AM Medical Record Number: [id number] Patient Account Number: [id number] Date of Birth/Sex: [birth year] (72 y.o. M) Treating RN: Cornell Barman Primary Care Provider: Yaakov Guthrie Other Clinician: Referring Provider: Yaakov Guthrie Treating Provider/Extender:  STONE III, Divit Stipp Weeks in Treatment: 4 History of Present Illness HPI Description: 12/16/17 on evaluation today patient presents concerning an issue that he had which began roughly 2 years ago which included pain with walking and having had injections previously along with inserts which he obtained from the [facility]. Unfortunately throughout all this he was not getting significantly. Therefore he did present to Dr. 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 Treatment options were discussed included conservative versus surgical. The patient proceed with surgical intervention at that time. Subsequently the patient ended up undergoing surgery apparently on 10/31/17 although I do not see the actual surgical note when I look for epic. Subsequently he was seen back for reevaluation on 11/06/17 and since that time has been undergoing dressing changes at Dr. Amalia Hailey office. With that being said wound care has been actually performed in their office on a regular basis since then he's been going in getting the dressings changed and rewrap and subsequently he had a significant issue where this became inflamed/infected and he was placed on Cipro 500 mg. He actually is finishing  that up today and in fact the foot itself actually appears to be doing well as far as I'm concerned. Nonetheless they have been performing good wound care according to what I'm seeing in the clinic where they were performing all of his dressing changes. Subsequently that has been from October 31, 2017 through current when I evaluated him today. The patient's wound culture which was obtained and completed did show Staphylococcus aureus and E. coli both sensitive to Cipro which is why this was utilize. The patient today states that he really wants to try to get this thing closed as quickly as possible. Patient has a history of diabetes mellitus type II with a hemoglobin A1c on 10/15/17 of 8.4. He also has a history of hypertension which is fairly well controlled. 12/23/17 on evaluation today patient actually appears to be doing better in regard to his foot ulcer. The overall appearance of the wound bed is dramatically improved compared to previous. He does not appear to have had any issues with bleeding after the chemical cauterization last week. Overall I feel like he's making good signs of improvement we have gotten approval for the Grafix PL. 01/07/18 but evaluation today patient actually appears to be doing better in regard to the wound bed at this point which is good news. There still is tendon exposed in the base of the wound but fortunately he seems to be making some signs of progress. This is good and I do believe the wound bed is ready for the application of the Grafix PL at this point. He has been tolerating the dressing's at home without complication and he is been doing excellent job taking care of this in my pinion. 01/13/18 on evaluation today patient presents for his second application of Grafix PL. He tolerated the first application without complication. He did end up performing the dressing change although he states when he changed the outer dressing that the mepitel came off with the  dressing and the packing strip came out as well. Nonetheless he states the dressing really was not to wet or saturated he mainly just did this out of habit. I believe that this go-round I would like for him to just attempt to leave this in place not removing the packing for any reason other than saturation of the dressing. Other  than that I do believe he is appropriate for the second application of Grafix PL. Electronic Signature(s) Signed: 01/15/2018 7:47:50 PM By: Worthy Keeler PA-C Entered By: Worthy Keeler on 01/15/2018 19:45:17 [name], [name] ([id number]) -------------------------------------------------------------------------------- Physical Exam Details Patient Name: DERRIEN, ANSCHUTZ Date of Service: 01/13/2018 11:15 AM Medical Record Number: [id number] Patient Account Number: [id number] Date of Birth/Sex: 1945/04/08 (72 y.o. M) Treating RN: Cornell Barman Primary Care Provider: Yaakov Guthrie Other Clinician: Referring Provider: Yaakov Guthrie Treating Provider/Extender: Melburn Hake, Robynne Roat Weeks in Treatment: 4 Constitutional Well-nourished and well-hydrated in no acute distress. Respiratory normal breathing without difficulty. Psychiatric this patient is able to make decisions and demonstrates good insight into disease process. Alert and Oriented x 3. pleasant and cooperative. Notes Patient's wound bed was gently debrided today to prepare the wound for application of the Grafix PL. He tolerated the debridement today without complication. This was secured with mepitel yet again as well is Steri-Strips in a dressing applied overlying. Electronic Signature(s) Signed: 01/15/2018 7:47:50 PM By: Worthy Keeler PA-C Entered By: Worthy Keeler on 01/15/2018 19:45:48 [name], [name] ([id number]) -------------------------------------------------------------------------------- Physician Orders Details Patient Name: [name] Date of Service: 01/13/2018 11:15 AM Medical Record  Number: [id number] Patient Account Number: [id number] Date of Birth/Sex: 1945/09/28 (72 y.o. M) Treating RN: Cornell Barman Primary Care Provider: Yaakov Guthrie Other Clinician: Referring Provider: Yaakov Guthrie Treating Provider/Extender: Melburn Hake, Leisl Spurrier Weeks in Treatment: 4 Verbal / Phone Orders: No Diagnosis Coding Wound Cleansing Wound #1 Right,Dorsal Foot o Clean wound with Normal Saline. Anesthetic (add to Medication List) Wound #1 Right,Dorsal Foot o Topical Lidocaine 4% cream applied to wound bed prior to debridement (In Clinic Only). Skin Barriers/Peri-Wound Care Wound #1 Right,Dorsal Foot o Skin Prep Dressing Change Frequency Wound #1 Right,Dorsal Foot o Change dressing every week Follow-up Appointments Wound #1 Right,Dorsal Foot o Return Appointment in 1 week. Advanced Therapies Wound #1 Right,Dorsal Foot o Grafix PL application in clinic; including contact layer, fixation with steri strips, dry gauze and cover dressing. Electronic Signature(s) Signed: 01/13/2018 3:24:13 PM By: Gretta Cool, BSN, RN, CWS, Kim RN, BSN Signed: 01/15/2018 7:47:50 PM By: Worthy Keeler PA-C Entered By: Gretta Cool BSN, RN, CWS, Kim on 01/13/2018 11:52:41 [name], [name] ([id number]) -------------------------------------------------------------------------------- Problem List Details Patient Name: ESEQUIEL, KLEINFELTER Date of Service: 01/13/2018 11:15 AM Medical Record Number: [id number] Patient Account Number: [id number] Date of Birth/Sex: 07-Nov-1945 (72 y.o. M) Treating RN: Cornell Barman Primary Care Provider: Yaakov Guthrie Other Clinician: Referring Provider: Yaakov Guthrie Treating Provider/Extender: Melburn Hake, Lekeya Rollings Weeks in Treatment: 4 Active Problems ICD-10 Evaluated Encounter Code Description Active Date Today Diagnosis T81.31XA Disruption of external operation (surgical) wound, not 12/16/2017 No Yes elsewhere classified, initial encounter E11.621 Type 2 diabetes mellitus with foot  ulcer 12/16/2017 No Yes L97.512 Non-pressure chronic ulcer of other part of right foot with fat 12/16/2017 No Yes layer exposed I10 Essential (primary) hypertension 12/16/2017 No Yes Inactive Problems Resolved Problems Electronic Signature(s) Signed: 01/15/2018 7:47:50 PM By: Worthy Keeler PA-C Entered By: Worthy Keeler on 01/15/2018 19:45:02 [name], [name] ([id number]) -------------------------------------------------------------------------------- Progress Note Details Patient Name: [name] Date of Service: 01/13/2018 11:15 AM Medical Record Number: [id number] Patient Account Number: [id number] Date of Birth/Sex: [birth year] (72 y.o. M) Treating RN: Cornell Barman Primary Care Provider: Yaakov Guthrie Other Clinician: Referring Provider: Yaakov Guthrie Treating Provider/Extender: Melburn Hake, Marnae Madani Weeks in Treatment: 4 Subjective Chief Complaint Information obtained from Patient Right foot surgical wound History of Present Illness (HPI) 12/16/17 on  evaluation today patient presents concerning an issue that he had which began roughly 2 years ago which included pain with walking and having had injections previously along with inserts which he obtained from the [facility]. Unfortunately throughout all this he was not getting significantly. Therefore he did present to Dr. Daylene Katayama who is a local podiatrist who diagnosed him with a Morton s Neuroma of the second interspace right foot. Treatment options were discussed included conservative versus surgical. The patient proceed with surgical intervention at that time. Subsequently the patient ended up undergoing surgery apparently on 10/31/17 although I do not see the actual surgical note when I look for epic. Subsequently he was seen back for reevaluation on 11/06/17 and since that time has been undergoing dressing changes at Dr. Amalia Hailey office. With that being said wound care has been actually performed in their office on a regular  basis since then he's been going in getting the dressings changed and rewrap and subsequently he had a significant issue where this became inflamed/infected and he was placed on Cipro 500 mg. He actually is finishing that up today and in fact the foot itself actually appears to be doing well as far as I'm concerned. Nonetheless they have been performing good wound care according to what I'm seeing in the clinic where they were performing all of his dressing changes. Subsequently that has been from October 31, 2017 through current when I evaluated him today. The patient's wound culture which was obtained and completed did show Staphylococcus aureus and E. coli both sensitive to Cipro which is why this was utilize. The patient today states that he really wants to try to get this thing closed as quickly as possible. Patient has a history of diabetes mellitus type II with a hemoglobin A1c on 10/15/17 of 8.4. He also has a history of hypertension which is fairly well controlled. 12/23/17 on evaluation today patient actually appears to be doing better in regard to his foot ulcer. The overall appearance of the wound bed is dramatically improved compared to previous. He does not appear to have had any issues with bleeding after the chemical cauterization last week. Overall I feel like he's making good signs of improvement we have gotten approval for the Grafix PL. 01/07/18 but evaluation today patient actually appears to be doing better in regard to the wound bed at this point which is good news. There still is tendon exposed in the base of the wound but fortunately he seems to be making some signs of progress. This is good and I do believe the wound bed is ready for the application of the Grafix PL at this point. He has been tolerating the dressing's at home without complication and he is been doing excellent job taking care of this in my pinion. 01/13/18 on evaluation today patient presents for his second  application of Grafix PL. He tolerated the first application without complication. He did end up performing the dressing change although he states when he changed the outer dressing that the mepitel came off with the dressing and the packing strip came out as well. Nonetheless he states the dressing really was not to wet or saturated he mainly just did this out of habit. I believe that this go-round I would like for him to just attempt to leave this in place not removing the packing for any reason other than saturation of the dressing. Other than that I do believe he is appropriate for the second application of Grafix  PL. Patient History Information obtained from Patient. Family History Cancer - Mother,Father, Diabetes - Paternal Grandparents, Lung Disease - Mother, Stroke - Father, No family history of Heart Disease, Hereditary Spherocytosis, Hypertension, Kidney Disease, Seizures, Thyroid Problems, Andersson, Valentin G. ([id number]) Tuberculosis. Social History Never smoker, Marital Status - Married, Alcohol Use - Never, Drug Use - No History, Caffeine Use - Moderate. Medical And Surgical History Notes Cardiovascular HLD Review of Systems (ROS) Constitutional Symptoms (General Health) Denies complaints or symptoms of Fever, Chills. Respiratory The patient has no complaints or symptoms. Cardiovascular The patient has no complaints or symptoms. Psychiatric The patient has no complaints or symptoms. Objective Constitutional Well-nourished and well-hydrated in no acute distress. Vitals Time Taken: 11:15 AM, Height: 77 in, Weight: 240 lbs, BMI: 28.5, Temperature: 98.1 F, Pulse: 73 bpm, Respiratory Rate: 16 breaths/min, Blood Pressure: 111/66 mmHg. Respiratory normal breathing without difficulty. Psychiatric this patient is able to make decisions and demonstrates good insight into disease process. Alert and Oriented x 3. pleasant and cooperative. General Notes: Patient's wound bed was  gently debrided today to prepare the wound for application of the Grafix PL. He tolerated the debridement today without complication. This was secured with mepitel yet again as well is Steri-Strips in a dressing applied overlying. Integumentary (Hair, Skin) Wound #1 status is Open. Original cause of wound was Surgical Injury. The wound is located on the Right,Dorsal Foot. The wound measures 1.3cm length x 0.5cm width x 0.6cm depth; 0.511cm^2 area and 0.306cm^3 volume. There is Fat Layer (Subcutaneous Tissue) Exposed exposed. There is no tunneling or undermining noted. There is a medium amount of sanguinous drainage noted. The wound margin is flat and intact. There is small (1-33%) pink granulation within the wound bed. There is a large (67-100%) amount of necrotic tissue within the wound bed including Eschar and Adherent Slough. The periwound skin appearance did not exhibit: Callus, Crepitus, Excoriation, Induration, Rash, Scarring, Dry/Scaly, Maceration, Atrophie Blanche, Cyanosis, Ecchymosis, Hemosiderin Staining, Mottled, Pallor, Rubor, Erythema. Periwound temperature was noted as No Abnormality. [name], [name] ([id number]) Assessment Active Problems ICD-10 Disruption of external operation (surgical) wound, not elsewhere classified, initial encounter Type 2 diabetes mellitus with foot ulcer Non-pressure chronic ulcer of other part of right foot with fat layer exposed Essential (primary) hypertension Procedures Wound #1 Pre-procedure diagnosis of Wound #1 is a Diabetic Wound/Ulcer of the Lower Extremity located on the Right,Dorsal Foot .Severity of Tissue Pre Debridement is: Fat layer exposed. There was a Excisional Skin/Subcutaneous Tissue Debridement with a total area of 0.65 sq cm performed by STONE III, Ashford Clouse E., PA-C. With the following instrument(s): Curette to remove Viable and Non-Viable tissue/material. Material removed includes Subcutaneous Tissue and Slough and after achieving  pain control using Lidocaine. No specimens were taken. A time out was conducted at 11:45, prior to the start of the procedure. A Minimum amount of bleeding was controlled with Pressure. The procedure was tolerated well. Post Debridement Measurements: 1.3cm length x 0.5cm width x 0.6cm depth; 0.306cm^3 volume. Character of Wound/Ulcer Post Debridement is stable. Severity of Tissue Post Debridement is: Limited to breakdown of skin. Post procedure Diagnosis Wound #1: Same as Pre-Procedure Pre-procedure diagnosis of Wound #1 is a Diabetic Wound/Ulcer of the Lower Extremity located on the Right,Dorsal Foot. A skin graft procedure using a bioengineered skin substitute/cellular or tissue based product was performed by STONE III, Prajna Vanderpool E., PA-C with the following instrument(s): Forceps and Scissors. Other was applied and secured with Steri-Strips. 6 sq cm of product was utilized and 0 sq  cm was wasted. Post Application, mepitel one was applied. A Time Out was conducted at 11:48, prior to the start of the procedure. The procedure was tolerated well with a pain level of 0 throughout and a pain level of 0 following the procedure. Post procedure Diagnosis Wound #1: Same as Pre-Procedure . Plan Wound Cleansing: Wound #1 Right,Dorsal Foot: Clean wound with Normal Saline. Anesthetic (add to Medication List): Wound #1 Right,Dorsal Foot: Topical Lidocaine 4% cream applied to wound bed prior to debridement (In Clinic Only). Skin Barriers/Peri-Wound Care: Wound #1 Right,Dorsal Foot: Skin Prep Dressing Change Frequency: Wound #1 Right,Dorsal Foot: Change dressing every week [name], [name]. ([id number]) Follow-up Appointments: Wound #1 Right,Dorsal Foot: Return Appointment in 1 week. Advanced Therapies: Wound #1 Right,Dorsal Foot: Grafix PL application in clinic; including contact layer, fixation with steri strips, dry gauze and cover dressing. We will see were things stand at follow-up. If anything  changes or worsens in the meantime the patient will let me know otherwise hopefully he'll still be doing just as well next week with additional granulation in the base of the wound the tendon is almost completely covered with granulation at this point. Please see above for specific wound care orders. We will see patient for re-evaluation in 1 week(s) here in the clinic. If anything worsens or changes patient will contact our office for additional recommendations. Electronic Signature(s) Signed: 01/15/2018 7:47:50 PM By: Worthy Keeler PA-C Entered By: Worthy Keeler on 01/15/2018 19:45:58 [name], [name] ([id number]) -------------------------------------------------------------------------------- ROS/PFSH Details Patient Name: [name] Date of Service: 01/13/2018 11:15 AM Medical Record Number: [id number] Patient Account Number: [id number] Date of Birth/Sex: 1945/11/03 (72 y.o. M) Treating RN: Cornell Barman Primary Care Provider: Yaakov Guthrie Other Clinician: Referring Provider: Yaakov Guthrie Treating Provider/Extender: Melburn Hake, Dann Ventress Weeks in Treatment: 4 Information Obtained From Patient Wound History Do you currently have one or more open woundso Yes How many open wounds do you currently haveo 1 Approximately how long have you had your woundso 6 weeks How have you been treating your wound(s) until nowo bandage by podiatry Has your wound(s) ever healed and then re-openedo No Have you had any lab work done in the past montho No Have you tested positive for an antibiotic resistant organism (MRSA, VRE)o No Have you tested positive for osteomyelitis (bone infection)o No Have you had any tests for circulation on your legso No Constitutional Symptoms (General Health) Complaints and Symptoms: Negative for: Fever; Chills Eyes Medical History: Negative for: Cataracts; Glaucoma; Optic Neuritis Ear/Nose/Mouth/Throat Medical History: Negative for: Chronic sinus  problems/congestion; Middle ear problems Hematologic/Lymphatic Medical History: Negative for: Anemia; Hemophilia; Human Immunodeficiency Virus; Lymphedema; Sickle Cell Disease Respiratory Complaints and Symptoms: No Complaints or Symptoms Medical History: Negative for: Aspiration; Asthma; Chronic Obstructive Pulmonary Disease (COPD); Pneumothorax; Sleep Apnea; Tuberculosis Cardiovascular Complaints and Symptoms: No Complaints or Symptoms Medical History: Positive for: Coronary Artery Disease; Hypertension Negative for: Angina; Arrhythmia; Congestive Heart Failure; Deep Vein Thrombosis; Hypotension; Myocardial Infarction; Lovejoy, Cowan G. ([id number]) Peripheral Arterial Disease; Peripheral Venous Disease; Phlebitis; Vasculitis Past Medical History Notes: HLD Gastrointestinal Medical History: Negative for: Cirrhosis ; Colitis; Crohnos; Hepatitis A; Hepatitis B; Hepatitis C Endocrine Medical History: Positive for: Type II Diabetes Negative for: Type I Diabetes Treated with: Oral agents Genitourinary Medical History: Negative for: End Stage Renal Disease Immunological Medical History: Negative for: Lupus Erythematosus; Raynaudos; Scleroderma Integumentary (Skin) Medical History: Negative for: History of Burn; History of pressure wounds Musculoskeletal Medical History: Negative for: Gout; Rheumatoid Arthritis; Osteoarthritis; Osteomyelitis Neurologic Medical  History: Negative for: Dementia; Neuropathy; Quadriplegia; Paraplegia; Seizure Disorder Oncologic Medical History: Negative for: Received Chemotherapy; Received Radiation Psychiatric Complaints and Symptoms: No Complaints or Symptoms Medical History: Negative for: Anorexia/bulimia; Confinement Anxiety Immunizations Pneumococcal Vaccine: Received Pneumococcal Vaccination: Yes [name], [name] ([id number]) Implantable Devices Family and Social History Cancer: Yes - Mother,Father; Diabetes: Yes - Paternal  Grandparents; Heart Disease: No; Hereditary Spherocytosis: No; Hypertension: No; Kidney Disease: No; Lung Disease: Yes - Mother; Seizures: No; Stroke: Yes - Father; Thyroid Problems: No; Tuberculosis: No; Never smoker; Marital Status - Married; Alcohol Use: Never; Drug Use: No History; Caffeine Use: Moderate; Financial Concerns: No; Food, Clothing or Shelter Needs: No; Support System Lacking: No; Transportation Concerns: No; Advanced Directives: No; Patient does not want information on Advanced Directives Physician Affirmation I have reviewed and agree with the above information. Electronic Signature(s) Signed: 01/15/2018 7:47:50 PM By: Worthy Keeler PA-C Signed: 01/16/2018 7:26:08 AM By: Gretta Cool, BSN, RN, CWS, Kim RN, BSN Entered By: Worthy Keeler on 01/15/2018 19:45:34 [name], [name] ([id number]) -------------------------------------------------------------------------------- SuperBill Details Patient Name: [name] Date of Service: 01/13/2018 Medical Record Number: [id number] Patient Account Number: [id number] Date of Birth/Sex: [birth year] (72 y.o. M) Treating RN: Cornell Barman Primary Care Provider: Yaakov Guthrie Other Clinician: Referring Provider: Yaakov Guthrie Treating Provider/Extender: Melburn Hake, Tabor Bartram Weeks in Treatment: 4 Diagnosis Coding ICD-10 Codes Code Description T81.31XA Disruption of external operation (surgical) wound, not elsewhere classified, initial encounter E11.621 Type 2 diabetes mellitus with foot ulcer L97.512 Non-pressure chronic ulcer of other part of right foot with fat layer exposed I10 Essential (primary) hypertension Facility Procedures CPT4: Description Modifier Quantity Code 81103159 [id number] - SKIN SUB GRAFT FACE/NK/HF/G 1 ICD-10 Diagnosis Description T81.31XA Disruption of external operation (surgical) wound, not elsewhere classified, initial encounter E11.621 Type 2 diabetes mellitus  with foot ulcer L97.512 Non-pressure chronic ulcer of other  part of right foot with fat layer exposed I10 Essential (primary) hypertension CPT4: [id number] Grafix PL 2x3 CM 6 Physician Procedures CPT4: Description Modifier Quantity Code 4462863 [id number] - WC PHYS SKIN SUB GRAFT FACE/NK/HF/G 1 ICD-10 Diagnosis Description T81.31XA Disruption of external operation (surgical) wound, not elsewhere classified, initial encounter E11.621 Type 2 diabetes  mellitus with foot ulcer L97.512 Non-pressure chronic ulcer of other part of right foot with fat layer exposed I10 Essential (primary) hypertension Electronic Signature(s) Signed: 01/15/2018 7:47:50 PM By: Worthy Keeler PA-C Previous Signature: 01/13/2018 3:24:13 PM Version By: Gretta Cool, BSN, RN, CWS, Kim RN, BSN Entered By: Worthy Keeler on 01/14/2018 12:47:45

## 2018-01-21 ENCOUNTER — Encounter: Payer: Medicare Other | Attending: Physician Assistant | Admitting: Physician Assistant

## 2018-01-21 DIAGNOSIS — I2510 Atherosclerotic heart disease of native coronary artery without angina pectoris: Secondary | ICD-10-CM | POA: Diagnosis not present

## 2018-01-21 DIAGNOSIS — I110 Hypertensive heart disease with heart failure: Secondary | ICD-10-CM | POA: Diagnosis not present

## 2018-01-21 DIAGNOSIS — T8131XA Disruption of external operation (surgical) wound, not elsewhere classified, initial encounter: Secondary | ICD-10-CM | POA: Insufficient documentation

## 2018-01-21 DIAGNOSIS — E119 Type 2 diabetes mellitus without complications: Secondary | ICD-10-CM | POA: Diagnosis not present

## 2018-01-21 DIAGNOSIS — E11621 Type 2 diabetes mellitus with foot ulcer: Secondary | ICD-10-CM | POA: Diagnosis not present

## 2018-01-21 DIAGNOSIS — Y838 Other surgical procedures as the cause of abnormal reaction of the patient, or of later complication, without mention of misadventure at the time of the procedure: Secondary | ICD-10-CM | POA: Diagnosis not present

## 2018-01-21 DIAGNOSIS — L97512 Non-pressure chronic ulcer of other part of right foot with fat layer exposed: Secondary | ICD-10-CM | POA: Diagnosis not present

## 2018-01-25 NOTE — Progress Notes (Addendum)
GIAVANNI, ODONOVAN ([id number]) Visit Report for 01/21/2018 Arrival Information Details Patient Name: [name], [name]. Date of Service: 01/21/2018 11:15 AM Medical Record Number: [id number] Patient Account Number: [id number] Date of Birth/Sex: 06-15-1945 (72 y.o. M) Treating RN: Montey Hora Primary Care Annetta Deiss: Yaakov Guthrie Other Clinician: Referring Roman Sandall: Yaakov Guthrie Treating Kaysin Brock/Extender: Melburn Hake, HOYT Weeks in Treatment: 5 Visit Information History Since Last Visit Added or deleted any medications: No Patient Arrived: Ambulatory Any new allergies or adverse reactions: No Arrival Time: 11:11 Had a fall or experienced change in No Accompanied By: self activities of daily living that may affect Transfer Assistance: None risk of falls: Patient Identification Verified: Yes Signs or symptoms of abuse/neglect since last visito No Secondary Verification Process Yes Hospitalized since last visit: No Completed: Implantable device outside of the clinic excluding No Patient Has Alerts: Yes cellular tissue based products placed in the center Patient Alerts: DMII since last visit: ABI 12/24/17 L 1.2 R Has Dressing in Place as Prescribed: Yes 1.2 Pain Present Now: No Electronic Signature(s) Signed: 01/21/2018 3:19:50 PM By: Lorine Bears RCP, RRT, CHT Entered By: Lorine Bears on 01/21/2018 11:13:24 [place], [name] ([id number]) -------------------------------------------------------------------------------- Encounter Discharge Information Details Patient Name: [name] Date of Service: 01/21/2018 11:15 AM Medical Record Number: [id number] Patient Account Number: [id number] Date of Birth/Sex: [birth year] (72 y.o. M) Treating RN: Montey Hora Primary Care Lugene Hitt: Yaakov Guthrie Other Clinician: Referring Azavier Creson: Yaakov Guthrie Treating Teon Hudnall/Extender: Melburn Hake, HOYT Weeks in Treatment: 5 Encounter Discharge Information Items  Post Procedure Vitals Discharge Condition: Stable Temperature (F): 97.5 Ambulatory Status: Ambulatory Pulse (bpm): 61 Discharge Destination: Home Respiratory Rate (breaths/min): 16 Transportation: Private Auto Blood Pressure (mmHg): 139/70 Accompanied By: self Schedule Follow-up Appointment: Yes Clinical Summary of Care: Electronic Signature(s) Signed: 01/21/2018 5:21:58 PM By: Montey Hora Entered By: Montey Hora on 01/21/2018 11:55:28 Aguallo, [name] ([id number]) -------------------------------------------------------------------------------- Lower Extremity Assessment Details Patient Name: [name] Date of Service: 01/21/2018 11:15 AM Medical Record Number: [id number] Patient Account Number: [id number] Date of Birth/Sex: [birth year] (72 y.o. M) Treating RN: Cornell Barman Primary Care Shawndra Clute: Yaakov Guthrie Other Clinician: Referring Seiji Wiswell: Yaakov Guthrie Treating Lelon Ikard/Extender: Melburn Hake, HOYT Weeks in Treatment: 5 Edema Assessment Assessed: [Left: No] [Right: No] Edema: [Left: N] [Right: o] Vascular Assessment Pulses: Dorsalis Pedis Palpable: [Right:Yes] Posterior Tibial Extremity colors, hair growth, and conditions: Extremity Color: [Right:Normal] Hair Growth on Extremity: [Right:No] Temperature of Extremity: [Right:Warm] Capillary Refill: [Right:< 3 seconds] Toe Nail Assessment Left: Right: Thick: No Discolored: No Deformed: No Improper Length and Hygiene: No Electronic Signature(s) Signed: 01/23/2018 10:02:59 AM By: Gretta Cool, BSN, RN, CWS, Kim RN, BSN Entered By: Gretta Cool, BSN, RN, CWS, Kim on 01/21/2018 11:22:15 [name] ([id number]) -------------------------------------------------------------------------------- Multi Wound Chart Details Patient Name: [name] Date of Service: 01/21/2018 11:15 AM Medical Record Number: [id number] Patient Account Number: [id number] Date of Birth/Sex: 02-02-1946 (72 y.o. M) Treating RN: Montey Hora Primary Care Zury Fazzino: Yaakov Guthrie Other Clinician: Referring Kaylanni Ezelle: Yaakov Guthrie Treating Lisle Skillman/Extender: Melburn Hake, HOYT Weeks in Treatment: 5 Vital Signs Height(in): 77 Pulse(bpm): 61 Weight(lbs): 240 Blood Pressure(mmHg): 139/70 Body Mass Index(BMI): 28 Temperature(F): 97.5 Respiratory Rate 16 (breaths/min): Photos: [1:No Photos] [N/A:N/A] Wound Location: [1:Right Foot - Dorsal] [N/A:N/A] Wounding Event: [1:Surgical Injury] [N/A:N/A] Primary Etiology: [1:Diabetic Wound/Ulcer of the N/A Lower Extremity] Secondary Etiology: [1:Open Surgical Wound] [N/A:N/A] Comorbid History: [1:Coronary Artery Disease, Hypertension, Type II Diabetes] [N/A:N/A] Date Acquired: [1:10/30/2017] [N/A:N/A] Weeks of Treatment: [1:5] [N/A:N/A] Wound Status: [1:Open] [N/A:N/A] Measurements L x W x  D [1:1x0.5x0.4] [N/A:N/A] (cm) Area (cm) : [1:0.393] [N/A:N/A] Volume (cm) : [1:0.157] [N/A:N/A] % Reduction in Area: [1:30.40%] [N/A:N/A] % Reduction in Volume: [1:82.70%] [N/A:N/A] Classification: [1:Grade 1] [N/A:N/A] Exudate Amount: [1:Medium] [N/A:N/A] Exudate Type: [1:Sanguinous] [N/A:N/A] Exudate Color: [1:red] [N/A:N/A] Wound Margin: [1:Flat and Intact] [N/A:N/A] Granulation Amount: [1:Small (1-33%)] [N/A:N/A] Granulation Quality: [1:Pink] [N/A:N/A] Necrotic Amount: [1:Large (67-100%)] [N/A:N/A] Necrotic Tissue: [1:Eschar, Adherent Slough] [N/A:N/A] Exposed Structures: [1:Fat Layer (Subcutaneous Tissue) Exposed: Yes Fascia: No Tendon: No Muscle: No Joint: No Bone: No] [N/A:N/A] Epithelialization: [1:None] [N/A:N/A] Periwound Skin Texture: [1:Excoriation: No Induration: No Callus: No] [N/A:N/A] Crepitus: No Rash: No Scarring: No Periwound Skin Moisture: Maceration: No N/A N/A Dry/Scaly: No Periwound Skin Color: Erythema: Yes N/A N/A Atrophie Blanche: No Cyanosis: No Ecchymosis: No Hemosiderin Staining: No Mottled: No Pallor: No Rubor: No Erythema Location:  Circumferential N/A N/A Temperature: No Abnormality N/A N/A Tenderness on Palpation: No N/A N/A Wound Preparation: Ulcer Cleansing: N/A N/A Rinsed/Irrigated with Saline Topical Anesthetic Applied: Other: lidocaine 4% Treatment Notes Electronic Signature(s) Signed: 01/21/2018 5:21:58 PM By: Montey Hora Entered By: Montey Hora on 01/21/2018 11:41:01 Taylor, [name] ([id number]) -------------------------------------------------------------------------------- [place] Details Patient Name: [name] Date of Service: 01/21/2018 11:15 AM Medical Record Number: [id number] Patient Account Number: [id number] Date of Birth/Sex: 05/31/1945 (72 y.o. M) Treating RN: Montey Hora Primary Care Brynlee Pennywell: Yaakov Guthrie Other Clinician: Referring Kagan Mutchler: Yaakov Guthrie Treating Cadyn Rodger/Extender: Melburn Hake, HOYT Weeks in Treatment: 5 Active Inactive ` Orientation to the Wound Care Program Nursing Diagnoses: Knowledge deficit related to the wound healing center program Goals: Patient/caregiver will verbalize understanding of the [place] Program Date Initiated: 12/16/2017 Target Resolution Date: 01/06/2018 Goal Status: Active Interventions: Provide education on orientation to the wound center Notes: ` Wound/Skin Impairment Nursing Diagnoses: Impaired tissue integrity Goals: Patient/caregiver will verbalize understanding of skin care regimen Date Initiated: 12/16/2017 Target Resolution Date: 01/12/2018 Goal Status: Active Ulcer/skin breakdown will have a volume reduction of 30% by week 4 Date Initiated: 12/16/2017 Target Resolution Date: 01/12/2018 Goal Status: Active Interventions: Assess patient/caregiver ability to obtain necessary supplies Assess patient/caregiver ability to perform ulcer/skin care regimen upon admission and as needed Assess ulceration(s) every visit Treatment Activities: Skin care regimen initiated :  12/16/2017 Notes: Electronic Signature(s) Signed: 01/21/2018 5:21:58 PM By: Donald Pore, [name] ([id number]) Entered By: Montey Hora on 01/21/2018 11:40:54 [place], [name] ([id number]) -------------------------------------------------------------------------------- Pain Assessment Details Patient Name: [name] Date of Service: 01/21/2018 11:15 AM Medical Record Number: [id number] Patient Account Number: [id number] Date of Birth/Sex: 04-24-1945 (72 y.o. M) Treating RN: Montey Hora Primary Care Karlei Waldo: Yaakov Guthrie Other Clinician: Referring Caroleen Stoermer: Yaakov Guthrie Treating Dannah Ryles/Extender: Melburn Hake, HOYT Weeks in Treatment: 5 Active Problems Location of Pain Severity and Description of Pain Patient Has Paino No Site Locations Pain Management and Medication Current Pain Management: Electronic Signature(s) Signed: 01/21/2018 3:19:50 PM By: Paulla Fore, RRT, CHT Signed: 01/21/2018 5:21:58 PM By: Montey Hora Entered By: Lorine Bears on 01/21/2018 11:13:31 [place], [name] ([id number]) -------------------------------------------------------------------------------- Patient/Caregiver Education Details Patient Name: [name] Date of Service: 01/21/2018 11:15 AM Medical Record Number: [id number] Patient Account Number: [id number] Date of Birth/Gender: June 24, 1945 (72 y.o. M) Treating RN: Montey Hora Primary Care Physician: Yaakov Guthrie Other Clinician: Referring Physician: Yaakov Guthrie Treating Physician/Extender: Sharalyn Ink in Treatment: 5 Education Assessment Education Provided To: Patient Education Topics Provided Wound/Skin Impairment: Handouts: Other: wound care sa ordered if needed Methods: Demonstration, Explain/Verbal Responses: State content correctly Electronic Signature(s) Signed: 01/21/2018 5:21:58  PM By: Montey Hora Entered By: Montey Hora on 01/21/2018 11:54:40 Patient,  [name] ([id number]) -------------------------------------------------------------------------------- Wound Assessment Details Patient Name: [name] Date of Service: 01/21/2018 11:15 AM Medical Record Number: [id number] Patient Account Number: [id number] Date of Birth/Sex: 1946/01/22 (72 y.o. M) Treating RN: Cornell Barman Primary Care Krrish Freund: Yaakov Guthrie Other Clinician: Referring Tavaras Goody: Yaakov Guthrie Treating Kyran Connaughton/Extender: Melburn Hake, HOYT Weeks in Treatment: 5 Wound Status Wound Number: 1 Primary Etiology: Diabetic Wound/Ulcer of the Lower Extremity Wound Location: Right Foot - Dorsal Secondary Open Surgical Wound Wounding Event: Surgical Injury Etiology: Date Acquired: 10/30/2017 Wound Status: Open Weeks Of Treatment: 5 Comorbid Coronary Artery Disease, Hypertension, Clustered Wound: No History: Type II Diabetes Photos Photo Uploaded By: Gretta Cool, BSN, RN, CWS, Kim on 01/21/2018 17:00:54 Wound Measurements Length: (cm) 1 Width: (cm) 0.5 Depth: (cm) 0.4 Area: (cm) 0.393 Volume: (cm) 0.157 % Reduction in Area: 30.4% % Reduction in Volume: 82.7% Epithelialization: None Wound Description Classification: Grade 1 Foul Odor Wound Margin: Flat and Intact Slough/Fi Exudate Amount: Medium Exudate Type: Sanguinous Exudate Color: red After Cleansing: No brino Yes Wound Bed Granulation Amount: Small (1-33%) Exposed Structure Granulation Quality: Pink Fascia Exposed: No Necrotic Amount: Large (67-100%) Fat Layer (Subcutaneous Tissue) Exposed: Yes Necrotic Quality: Eschar, Adherent Slough Tendon Exposed: No Muscle Exposed: No Joint Exposed: No Bone Exposed: No Periwound Skin Texture Wickwire, Enmanuel G. ([id number]) Texture Color No Abnormalities Noted: No No Abnormalities Noted: No Callus: No Atrophie Blanche: No Crepitus: No Cyanosis: No Excoriation: No Ecchymosis: No Induration: No Erythema: Yes Rash: No Erythema Location:  Circumferential Scarring: No Hemosiderin Staining: No Mottled: No Moisture Pallor: No No Abnormalities Noted: No Rubor: No Dry / Scaly: No Maceration: No Temperature / Pain Temperature: No Abnormality Wound Preparation Ulcer Cleansing: Rinsed/Irrigated with Saline Topical Anesthetic Applied: Other: lidocaine 4%, Treatment Notes Wound #1 (Right, Dorsal Foot) 1. Cleansed with: Clean wound with Normal Saline 2. Anesthetic Topical Lidocaine 4% cream to wound bed prior to debridement 3. Peri-wound Care: Skin Prep Notes grafix pl applied in clinic today with mepitel and steri strips, silvercel on top and BFD Electronic Signature(s) Signed: 01/23/2018 10:02:59 AM By: Gretta Cool, BSN, RN, CWS, Kim RN, BSN Entered By: Gretta Cool, BSN, RN, CWS, Kim on 01/21/2018 11:21:36 [name] ([id number]) -------------------------------------------------------------------------------- [place] Details Patient Name: [name] Date of Service: 01/21/2018 11:15 AM Medical Record Number: [id number] Patient Account Number: [id number] Date of Birth/Sex: 08-11-1945 (72 y.o. M) Treating RN: Montey Hora Primary Care Baillie Mohammad: Yaakov Guthrie Other Clinician: Referring Emili Mcloughlin: Yaakov Guthrie Treating Katara Griner/Extender: Melburn Hake, HOYT Weeks in Treatment: 5 Vital Signs Time Taken: 11:12 Temperature (F): 97.5 Height (in): 77 Pulse (bpm): 61 Weight (lbs): 240 Respiratory Rate (breaths/min): 16 Body Mass Index (BMI): 28.5 Blood Pressure (mmHg): 139/70 Reference Range: 80 - 120 mg / dl Electronic Signature(s) Signed: 01/21/2018 3:19:50 PM By: Lorine Bears RCP, RRT, CHT Entered By: Lorine Bears on 01/21/2018 11:15:58

## 2018-01-27 DIAGNOSIS — E785 Hyperlipidemia, unspecified: Secondary | ICD-10-CM | POA: Diagnosis not present

## 2018-01-27 DIAGNOSIS — E118 Type 2 diabetes mellitus with unspecified complications: Secondary | ICD-10-CM | POA: Diagnosis not present

## 2018-01-27 DIAGNOSIS — R809 Proteinuria, unspecified: Secondary | ICD-10-CM | POA: Diagnosis not present

## 2018-01-27 NOTE — Progress Notes (Signed)
ARDIS, FULLWOOD ([id number]) Visit Report for 01/21/2018 Chief Complaint Document Details Patient Name: [name], [name]. Date of Service: 01/21/2018 11:15 AM Medical Record Number: [id number] Patient Account Number: [id number] Date of Birth/Sex: [birth year] (72 y.o. M) Treating RN: Montey Hora Primary Care Provider: Yaakov Guthrie Other Clinician: Referring Provider: Yaakov Guthrie Treating Provider/Extender: Melburn Hake, HOYT Weeks in Treatment: 5 Information Obtained from: Patient Chief Complaint Right foot surgical wound Electronic Signature(s) Signed: 01/24/2018 1:32:49 PM By: Worthy Keeler PA-C Entered By: Worthy Keeler on 01/24/2018 13:21:52 Palm, [name] ([id number]) -------------------------------------------------------------------------------- Cellular or Tissue Based Product Details Patient Name: [name], [name] Date of Service: 01/21/2018 11:15 AM Medical Record Number: [id number] Patient Account Number: [id number] Date of Birth/Sex: 1945/08/22 (72 y.o. M) Treating RN: Montey Hora Primary Care Provider: Yaakov Guthrie Other Clinician: Referring Provider: Yaakov Guthrie Treating Provider/Extender: Melburn Hake, HOYT Weeks in Treatment: 5 Cellular or Tissue Based Wound #1 Right,Dorsal Foot Product Type Applied to: Performed By: Physician STONE III, HOYT E., PA-C Cellular or Tissue Based Other Product Type: Level of Consciousness (Pre- Awake and Alert procedure): Pre-procedure Verification/Time Yes - 11:47 Out Taken: Location: genitalia / hands / feet / multiple digits Wound Size (sq cm): 0.5 Product Size (sq cm): 6 Waste Size (sq cm): 5.5 Waste Reason: wound size Amount of Product Applied (sq cm): 0.5 Instrument Used: Forceps, Scissors, cotton tipped applicators Lot #: [id number] Expiration Date: 04/17/2019 Fenestrated: No Reconstituted: Yes Solution Type: normal saline Solution Amount: 2 ML Lot #: [id number] Solution Expiration Date: 06/18/2019 Secured:  Yes Secured With: Steri-Strips Dressing Applied: Yes Primary Dressing: mepitel one Procedural Pain: 0 Post Procedural Pain: 0 Response to Treatment: Procedure was tolerated well Level of Consciousness (Post- Awake and Alert procedure): Post Procedure Diagnosis Same as Pre-procedure Electronic Signature(s) Signed: 01/21/2018 5:21:58 PM By: Montey Hora Entered By: Montey Hora on 01/21/2018 11:51:04 [name], [name] ([id number]) -------------------------------------------------------------------------------- Debridement Details Patient Name: [name] Date of Service: 01/21/2018 11:15 AM Medical Record Number: [id number] Patient Account Number: [id number] Date of Birth/Sex: [birth year] (72 y.o. M) Treating RN: Montey Hora Primary Care Provider: Yaakov Guthrie Other Clinician: Referring Provider: Yaakov Guthrie Treating Provider/Extender: Melburn Hake, HOYT Weeks in Treatment: 5 Debridement Performed for Wound #1 Right,Dorsal Foot Assessment: Performed By: Physician STONE III, HOYT E., PA-C Debridement Type: Debridement Severity of Tissue Pre Fat layer exposed Debridement: Level of Consciousness (Pre- Awake and Alert procedure): Pre-procedure Verification/Time Yes - 11:41 Out Taken: Start Time: 11:41 Pain Control: Lidocaine 4% Topical Solution Total Area Debrided (L x W): 1 (cm) x 0.5 (cm) = 0.5 (cm) Tissue and other material Viable, Non-Viable, Slough, Subcutaneous, Slough debrided: Level: Skin/Subcutaneous Tissue Debridement Description: Excisional Instrument: Curette Bleeding: Minimum Hemostasis Achieved: Pressure End Time: 11:44 Procedural Pain: 0 Post Procedural Pain: 0 Response to Treatment: Procedure was tolerated well Level of Consciousness Awake and Alert (Post-procedure): Post Debridement Measurements of Total Wound Length: (cm) 1 Width: (cm) 0.5 Depth: (cm) 0.5 Volume: (cm) 0.196 Character of Wound/Ulcer Post Debridement:  Improved Severity of Tissue Post Debridement: Fat layer exposed Post Procedure Diagnosis Same as Pre-procedure Electronic Signature(s) Signed: 01/21/2018 5:21:58 PM By: Montey Hora Signed: 01/24/2018 1:32:49 PM By: Worthy Keeler PA-C Entered By: Montey Hora on 01/21/2018 11:44:20 [name], [name] ([id number]) -------------------------------------------------------------------------------- HPI Details Patient Name: [name] Date of Service: 01/21/2018 11:15 AM Medical Record Number: [id number] Patient Account Number: [id number] Date of Birth/Sex: [birth year] (72 y.o. M) Treating RN: Montey Hora Primary Care Provider: Yaakov Guthrie Other Clinician: Referring Provider: Yaakov Guthrie Treating  Provider/Extender: Melburn Hake, HOYT Weeks in Treatment: 5 History of Present Illness HPI Description: 12/16/17 on evaluation today patient presents concerning an issue that he had which began roughly 2 years ago which included pain with walking and having had injections previously along with inserts which he obtained from the [facility]. Unfortunately throughout all this he was not getting significantly. Therefore he did present to Dr. 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 Treatment options were discussed included conservative versus surgical. The patient proceed with surgical intervention at that time. Subsequently the patient ended up undergoing surgery apparently on 10/31/17 although I do not see the actual surgical note when I look for epic. Subsequently he was seen back for reevaluation on 11/06/17 and since that time has been undergoing dressing changes at Dr. Amalia Hailey office. With that being said wound care has been actually performed in their office on a regular basis since then he's been going in getting the dressings changed and rewrap and subsequently he had a significant issue where this  became inflamed/infected and he was placed on Cipro 500 mg. He actually is finishing that up today and in fact the foot itself actually appears to be doing well as far as I'm concerned. Nonetheless they have been performing good wound care according to what I'm seeing in the clinic where they were performing all of his dressing changes. Subsequently that has been from October 31, 2017 through current when I evaluated him today. The patient's wound culture which was obtained and completed did show Staphylococcus aureus and E. coli both sensitive to Cipro which is why this was utilize. The patient today states that he really wants to try to get this thing closed as quickly as possible. Patient has a history of diabetes mellitus type II with a hemoglobin A1c on 10/15/17 of 8.4. He also has a history of hypertension which is fairly well controlled. 12/23/17 on evaluation today patient actually appears to be doing better in regard to his foot ulcer. The overall appearance of the wound bed is dramatically improved compared to previous. He does not appear to have had any issues with bleeding after the chemical cauterization last week. Overall I feel like he's making good signs of improvement we have gotten approval for the Grafix PL. 01/07/18 but evaluation today patient actually appears to be doing better in regard to the wound bed at this point which is good news. There still is tendon exposed in the base of the wound but fortunately he seems to be making some signs of progress. This is good and I do believe the wound bed is ready for the application of the Grafix PL at this point. He has been tolerating the dressing's at home without complication and he is been doing excellent job taking care of this in my pinion. 01/13/18 on evaluation today patient presents for his second application of Grafix PL. He tolerated the first application without complication. He did end up performing the dressing change  although he states when he changed the outer dressing that the mepitel came off with the dressing and the packing strip came out as well. Nonetheless he states the dressing really was not to wet or saturated he mainly just did this out of habit. I believe that this go-round I would like for him to just attempt to leave this in place not removing the packing for any reason other than saturation of the dressing.  Other than that I do believe he is appropriate for the second application of Grafix PL. 01/21/18 evaluation today patient's wound actually show signs of excellent granulation. He has been tolerating the Grafix PL without complication at this point. Overall I do feel like that the granulation noted has been quite significant over the period of time that we treated him just for two weeks currently. He's been showing signs of good granulation base of the wound and specifically the tendon which was showing is no longer present all of which is excellent. In general I've been extremely happy with the overall progress. The patient likewise is pleased with how things are progressing. Electronic Signature(s) Signed: 01/24/2018 1:32:49 PM By: Worthy Keeler PA-C Entered By: Worthy Keeler on 01/24/2018 13:22:00 [name], [name] ([id number]) -------------------------------------------------------------------------------- Physical Exam Details Patient Name: [name], [name] Date of Service: 01/21/2018 11:15 AM Medical Record Number: [id number] Patient Account Number: [id number] Date of Birth/Sex: Jan 18, 1946 (72 y.o. M) Treating RN: Montey Hora Primary Care Provider: Yaakov Guthrie Other Clinician: Referring Provider: Yaakov Guthrie Treating Provider/Extender: STONE III, HOYT Weeks in Treatment: 5 Constitutional Well-nourished and well-hydrated in no acute distress. Respiratory normal breathing without difficulty. Psychiatric this patient is able to make decisions and demonstrates good insight  into disease process. Alert and Oriented x 3. pleasant and cooperative. Notes Patient's wound bed today did require light sharp debridement to prepare the wound bed for application of the Grafix PL. Post debridement appear to be doing significantly better with excellent granulation and again no tendon noted in the base of the wound which the first time this is the case. Subsequently I did following this point go ahead and apply the third application of Grafix PL which the patient tolerated without complication. A packing strip was used to hold this in place. Electronic Signature(s) Signed: 01/24/2018 1:32:49 PM By: Worthy Keeler PA-C Entered By: Worthy Keeler on 01/24/2018 13:22:32 [name], [name] ([id number]) -------------------------------------------------------------------------------- Physician Orders Details Patient Name: [name] Date of Service: 01/21/2018 11:15 AM Medical Record Number: [id number] Patient Account Number: [id number] Date of Birth/Sex: 09-14-1945 (72 y.o. M) Treating RN: Montey Hora Primary Care Provider: Yaakov Guthrie Other Clinician: Referring Provider: Yaakov Guthrie Treating Provider/Extender: Melburn Hake, HOYT Weeks in Treatment: 5 Verbal / Phone Orders: No Diagnosis Coding Wound Cleansing Wound #1 Right,Dorsal Foot o Clean wound with Normal Saline. Anesthetic (add to Medication List) Wound #1 Right,Dorsal Foot o Topical Lidocaine 4% cream applied to wound bed prior to debridement (In Clinic Only). Skin Barriers/Peri-Wound Care Wound #1 Right,Dorsal Foot o Skin Prep Primary Wound Dressing Wound #1 Right,Dorsal Foot o Silver Alginate - SilverCel Secondary Dressing Wound #1 Right,Dorsal Foot o Boardered Foam Dressing Dressing Change Frequency Wound #1 Right,Dorsal Foot o Change dressing every other day. Follow-up Appointments Wound #1 Right,Dorsal Foot o Return Appointment in 1 week. Advanced Therapies Wound #1  Right,Dorsal Foot o Grafix PL application in clinic; including contact layer, fixation with steri strips, dry gauze and cover dressing. Electronic Signature(s) Signed: 01/21/2018 5:21:58 PM By: Montey Hora Signed: 01/24/2018 1:32:49 PM By: Worthy Keeler PA-C Entered By: Montey Hora on 01/21/2018 11:52:42 [name], [name] ([id number]) -------------------------------------------------------------------------------- Problem List Details Patient Name: [name], [name] Date of Service: 01/21/2018 11:15 AM Medical Record Number: [id number] Patient Account Number: [id number] Date of Birth/Sex: 1945-06-24 (72 y.o. M) Treating RN: Montey Hora Primary Care Provider: Yaakov Guthrie Other Clinician: Referring Provider: Yaakov Guthrie Treating Provider/Extender: Melburn Hake, HOYT Weeks in Treatment: 5 Active Problems ICD-10 Evaluated Encounter  Code Description Active Date Today Diagnosis T81.31XA Disruption of external operation (surgical) wound, not 12/16/2017 No Yes elsewhere classified, initial encounter E11.621 Type 2 diabetes mellitus with foot ulcer 12/16/2017 No Yes L97.512 Non-pressure chronic ulcer of other part of right foot with fat 12/16/2017 No Yes layer exposed I10 Essential (primary) hypertension 12/16/2017 No Yes Inactive Problems Resolved Problems Electronic Signature(s) Signed: 01/24/2018 1:32:49 PM By: Worthy Keeler PA-C Entered By: Worthy Keeler on 01/24/2018 13:21:47 [name], [name] ([id number]) -------------------------------------------------------------------------------- Progress Note Details Patient Name: [name] Date of Service: 01/21/2018 11:15 AM Medical Record Number: [id number] Patient Account Number: [id number] Date of Birth/Sex: 1945-06-16 (72 y.o. M) Treating RN: Montey Hora Primary Care Provider: Yaakov Guthrie Other Clinician: Referring Provider: Yaakov Guthrie Treating Provider/Extender: Melburn Hake, HOYT Weeks in Treatment:  5 Subjective Chief Complaint Information obtained from Patient Right foot surgical wound History of Present Illness (HPI) 12/16/17 on evaluation today patient presents concerning an issue that he had which began roughly 2 years ago which included pain with walking and having had injections previously along with inserts which he obtained from the [facility]. Unfortunately throughout all this he was not getting significantly. Therefore he did present to Dr. Daylene Katayama who is a local podiatrist who diagnosed him with a Morton s Neuroma of the second interspace right foot. Treatment options were discussed included conservative versus surgical. The patient proceed with surgical intervention at that time. Subsequently the patient ended up undergoing surgery apparently on 10/31/17 although I do not see the actual surgical note when I look for epic. Subsequently he was seen back for reevaluation on 11/06/17 and since that time has been undergoing dressing changes at Dr. Amalia Hailey office. With that being said wound care has been actually performed in their office on a regular basis since then he's been going in getting the dressings changed and rewrap and subsequently he had a significant issue where this became inflamed/infected and he was placed on Cipro 500 mg. He actually is finishing that up today and in fact the foot itself actually appears to be doing well as far as I'm concerned. Nonetheless they have been performing good wound care according to what I'm seeing in the clinic where they were performing all of his dressing changes. Subsequently that has been from October 31, 2017 through current when I evaluated him today. The patient's wound culture which was obtained and completed did show Staphylococcus aureus and E. coli both sensitive to Cipro which is why this was utilize. The patient today states that he really wants to try to get this thing closed as quickly as possible. Patient has a history  of diabetes mellitus type II with a hemoglobin A1c on 10/15/17 of 8.4. He also has a history of hypertension which is fairly well controlled. 12/23/17 on evaluation today patient actually appears to be doing better in regard to his foot ulcer. The overall appearance of the wound bed is dramatically improved compared to previous. He does not appear to have had any issues with bleeding after the chemical cauterization last week. Overall I feel like he's making good signs of improvement we have gotten approval for the Grafix PL. 01/07/18 but evaluation today patient actually appears to be doing better in regard to the wound bed at this point which is good news. There still is tendon exposed in the base of the wound but fortunately he seems to be making some signs of progress. This is good and I do believe the wound bed  is ready for the application of the Grafix PL at this point. He has been tolerating the dressing's at home without complication and he is been doing excellent job taking care of this in my pinion. 01/13/18 on evaluation today patient presents for his second application of Grafix PL. He tolerated the first application without complication. He did end up performing the dressing change although he states when he changed the outer dressing that the mepitel came off with the dressing and the packing strip came out as well. Nonetheless he states the dressing really was not to wet or saturated he mainly just did this out of habit. I believe that this go-round I would like for him to just attempt to leave this in place not removing the packing for any reason other than saturation of the dressing. Other than that I do believe he is appropriate for the second application of Grafix PL. 01/21/18 evaluation today patient's wound actually show signs of excellent granulation. He has been tolerating the Grafix PL without complication at this point. Overall I do feel like that the granulation noted has been  quite significant over the period of time that we treated him just for two weeks currently. He's been showing signs of good granulation base of the wound and specifically the tendon which was showing is no longer present all of which is excellent. In general I've been extremely happy with the overall progress. The patient likewise is pleased with how things are progressing. [name], [name] ([id number]) Patient History Information obtained from Patient. Family History Cancer - Mother,Father, Diabetes - Paternal Grandparents, Lung Disease - Mother, Stroke - Father, No family history of Heart Disease, Hereditary Spherocytosis, Hypertension, Kidney Disease, Seizures, Thyroid Problems, Tuberculosis. Social History Never smoker, Marital Status - Married, Alcohol Use - Never, Drug Use - No History, Caffeine Use - Moderate. Medical And Surgical History Notes Cardiovascular HLD Review of Systems (ROS) Constitutional Symptoms (General Health) Denies complaints or symptoms of Fever, Chills. Respiratory The patient has no complaints or symptoms. Cardiovascular The patient has no complaints or symptoms. Psychiatric The patient has no complaints or symptoms. Objective Constitutional Well-nourished and well-hydrated in no acute distress. Vitals Time Taken: 11:12 AM, Height: 77 in, Weight: 240 lbs, BMI: 28.5, Temperature: 97.5 F, Pulse: 61 bpm, Respiratory Rate: 16 breaths/min, Blood Pressure: 139/70 mmHg. Respiratory normal breathing without difficulty. Psychiatric this patient is able to make decisions and demonstrates good insight into disease process. Alert and Oriented x 3. pleasant and cooperative. General Notes: Patient's wound bed today did require light sharp debridement to prepare the wound bed for application of the Grafix PL. Post debridement appear to be doing significantly better with excellent granulation and again no tendon noted in the base of the wound which the first time  this is the case. Subsequently I did following this point go ahead and apply the third application of Grafix PL which the patient tolerated without complication. A packing strip was used to hold this in place. Integumentary (Hair, Skin) Wound #1 status is Open. Original cause of wound was Surgical Injury. The wound is located on the Right,Dorsal Foot. The wound measures 1cm length x 0.5cm width x 0.4cm depth; 0.393cm^2 area and 0.157cm^3 volume. There is Fat Layer (Subcutaneous Tissue) Exposed exposed. There is a medium amount of sanguinous drainage noted. The wound margin is [name], Brandon G. ([id number]) flat and intact. There is small (1-33%) pink granulation within the wound bed. There is a large (67-100%) amount of necrotic tissue within the wound  bed including Eschar and Adherent Slough. The periwound skin appearance exhibited: Erythema. The periwound skin appearance did not exhibit: Callus, Crepitus, Excoriation, Induration, Rash, Scarring, Dry/Scaly, Maceration, Atrophie Blanche, Cyanosis, Ecchymosis, Hemosiderin Staining, Mottled, Pallor, Rubor. The surrounding wound skin color is noted with erythema which is circumferential. Periwound temperature was noted as No Abnormality. Assessment Active Problems ICD-10 Disruption of external operation (surgical) wound, not elsewhere classified, initial encounter Type 2 diabetes mellitus with foot ulcer Non-pressure chronic ulcer of other part of right foot with fat layer exposed Essential (primary) hypertension Procedures Wound #1 Pre-procedure diagnosis of Wound #1 is a Diabetic Wound/Ulcer of the Lower Extremity located on the Right,Dorsal Foot .Severity of Tissue Pre Debridement is: Fat layer exposed. There was a Excisional Skin/Subcutaneous Tissue Debridement with a total area of 0.5 sq cm performed by STONE III, HOYT E., PA-C. With the following instrument(s): Curette to remove Viable and Non-Viable tissue/material. Material removed  includes Subcutaneous Tissue and Slough and after achieving pain control using Lidocaine 4% Topical Solution. No specimens were taken. A time out was conducted at 11:41, prior to the start of the procedure. A Minimum amount of bleeding was controlled with Pressure. The procedure was tolerated well with a pain level of 0 throughout and a pain level of 0 following the procedure. Post Debridement Measurements: 1cm length x 0.5cm width x 0.5cm depth; 0.196cm^3 volume. Character of Wound/Ulcer Post Debridement is improved. Severity of Tissue Post Debridement is: Fat layer exposed. Post procedure Diagnosis Wound #1: Same as Pre-Procedure Pre-procedure diagnosis of Wound #1 is a Diabetic Wound/Ulcer of the Lower Extremity located on the Right,Dorsal Foot. A skin graft procedure using a bioengineered skin substitute/cellular or tissue based product was performed by STONE III, HOYT E., PA-C with the following instrument(s): Forceps, Scissors, and cotton tipped applicators. Other was applied and secured with Steri-Strips. 0.5 sq cm of product was utilized and 5.5 sq cm was wasted due to wound size. Post Application, mepitel one was applied. A Time Out was conducted at 11:47, prior to the start of the procedure. The procedure was tolerated well with a pain level of 0 throughout and a pain level of 0 following the procedure. Post procedure Diagnosis Wound #1: Same as Pre-Procedure . Plan Wound Cleansing: Wound #1 Right,Dorsal Foot: Clean wound with Normal Saline. Anesthetic (add to Medication List): JUANITO, GONYER ([id number]) Wound #1 Right,Dorsal Foot: Topical Lidocaine 4% cream applied to wound bed prior to debridement (In Clinic Only). Skin Barriers/Peri-Wound Care: Wound #1 Right,Dorsal Foot: Skin Prep Primary Wound Dressing: Wound #1 Right,Dorsal Foot: Silver Alginate - SilverCel Secondary Dressing: Wound #1 Right,Dorsal Foot: Boardered Foam Dressing Dressing Change Frequency: Wound #1  Right,Dorsal Foot: Change dressing every other day. Follow-up Appointments: Wound #1 Right,Dorsal Foot: Return Appointment in 1 week. Advanced Therapies: Wound #1 Right,Dorsal Foot: Grafix PL application in clinic; including contact layer, fixation with steri strips, dry gauze and cover dressing. I'm gonna suggest at this point that we continue with the above wound care measures for the next week. He will lead the dressing in place this worked very well last week I think that's the best thing to do to avoid inadvertently removing the skin substitute. We will subsequently see were things stand at follow-up. Please see above for specific wound care orders. We will see patient for re-evaluation in 1 week(s) here in the clinic. If anything worsens or changes patient will contact our office for additional recommendations. Electronic Signature(s) Signed: 01/24/2018 1:32:49 PM By: Worthy Keeler PA-C Entered By:  Worthy Keeler on 01/24/2018 13:22:46 KENICHI, CASSADA ([id number]) -------------------------------------------------------------------------------- ROS/PFSH Details Patient Name: VERGIL, BURBY. Date of Service: 01/21/2018 11:15 AM Medical Record Number: [id number] Patient Account Number: [id number] Date of Birth/Sex: 1945/12/10 (72 y.o. M) Treating RN: Montey Hora Primary Care Provider: Yaakov Guthrie Other Clinician: Referring Provider: Yaakov Guthrie Treating Provider/Extender: Melburn Hake, HOYT Weeks in Treatment: 5 Information Obtained From Patient Wound History Do you currently have one or more open woundso Yes How many open wounds do you currently haveo 1 Approximately how long have you had your woundso 6 weeks How have you been treating your wound(s) until nowo bandage by podiatry Has your wound(s) ever healed and then re-openedo No Have you had any lab work done in the past montho No Have you tested positive for an antibiotic resistant organism (MRSA, VRE)o No Have you  tested positive for osteomyelitis (bone infection)o No Have you had any tests for circulation on your legso No Constitutional Symptoms (General Health) Complaints and Symptoms: Negative for: Fever; Chills Eyes Medical History: Negative for: Cataracts; Glaucoma; Optic Neuritis Ear/Nose/Mouth/Throat Medical History: Negative for: Chronic sinus problems/congestion; Middle ear problems Hematologic/Lymphatic Medical History: Negative for: Anemia; Hemophilia; Human Immunodeficiency Virus; Lymphedema; Sickle Cell Disease Respiratory Complaints and Symptoms: No Complaints or Symptoms Medical History: Negative for: Aspiration; Asthma; Chronic Obstructive Pulmonary Disease (COPD); Pneumothorax; Sleep Apnea; Tuberculosis Cardiovascular Complaints and Symptoms: No Complaints or Symptoms Medical History: Positive for: Coronary Artery Disease; Hypertension Negative for: Angina; Arrhythmia; Congestive Heart Failure; Deep Vein Thrombosis; Hypotension; Myocardial Infarction; Natividad, Ivis G. ([id number]) Peripheral Arterial Disease; Peripheral Venous Disease; Phlebitis; Vasculitis Past Medical History Notes: HLD Gastrointestinal Medical History: Negative for: Cirrhosis ; Colitis; Crohnos; Hepatitis A; Hepatitis B; Hepatitis C Endocrine Medical History: Positive for: Type II Diabetes Negative for: Type I Diabetes Treated with: Oral agents Genitourinary Medical History: Negative for: End Stage Renal Disease Immunological Medical History: Negative for: Lupus Erythematosus; Raynaudos; Scleroderma Integumentary (Skin) Medical History: Negative for: History of Burn; History of pressure wounds Musculoskeletal Medical History: Negative for: Gout; Rheumatoid Arthritis; Osteoarthritis; Osteomyelitis Neurologic Medical History: Negative for: Dementia; Neuropathy; Quadriplegia; Paraplegia; Seizure Disorder Oncologic Medical History: Negative for: Received Chemotherapy; Received  Radiation Psychiatric Complaints and Symptoms: No Complaints or Symptoms Medical History: Negative for: Anorexia/bulimia; Confinement Anxiety Immunizations Pneumococcal Vaccine: Received Pneumococcal Vaccination: Yes SHANDY, VI ([id number]) Implantable Devices Family and Social History Cancer: Yes - Mother,Father; Diabetes: Yes - Paternal Grandparents; Heart Disease: No; Hereditary Spherocytosis: No; Hypertension: No; Kidney Disease: No; Lung Disease: Yes - Mother; Seizures: No; Stroke: Yes - Father; Thyroid Problems: No; Tuberculosis: No; Never smoker; Marital Status - Married; Alcohol Use: Never; Drug Use: No History; Caffeine Use: Moderate; Financial Concerns: No; Food, Clothing or Shelter Needs: No; Support System Lacking: No; Transportation Concerns: No; Advanced Directives: No; Patient does not want information on Advanced Directives Physician Affirmation I have reviewed and agree with the above information. Electronic Signature(s) Signed: 01/24/2018 1:32:49 PM By: Worthy Keeler PA-C Signed: 01/24/2018 5:34:16 PM By: Montey Hora Previous Signature: 01/21/2018 5:21:58 PM Version By: Montey Hora Entered By: Worthy Keeler on 01/24/2018 13:22:18 Granillo, [name] ([id number]) -------------------------------------------------------------------------------- SuperBill Details Patient Name: [name] Date of Service: 01/21/2018 Medical Record Number: [id number] Patient Account Number: [id number] Date of Birth/Sex: 05/09/1945 (72 y.o. M) Treating RN: Montey Hora Primary Care Provider: Yaakov Guthrie Other Clinician: Referring Provider: Yaakov Guthrie Treating Provider/Extender: Melburn Hake, HOYT Weeks in Treatment: 5 Diagnosis Coding ICD-10 Codes Code Description T81.31XA Disruption of external operation (surgical) wound, not elsewhere classified,  initial encounter E11.621 Type 2 diabetes mellitus with foot ulcer L97.512 Non-pressure chronic ulcer of other part of  right foot with fat layer exposed I10 Essential (primary) hypertension Facility Procedures CPT4: Description Modifier Quantity Code 75170017 [id number] - SKIN SUB GRAFT FACE/NK/HF/G 1 ICD-10 Diagnosis Description T81.31XA Disruption of external operation (surgical) wound, not elsewhere classified, initial encounter E11.621 Type 2 diabetes mellitus  with foot ulcer L97.512 Non-pressure chronic ulcer of other part of right foot with fat layer exposed CPT4: [id number] Grafix PL 2x3 CM 6 Physician Procedures CPT4: Description Modifier Quantity Code 9163846 65993 - WC PHYS SKIN SUB GRAFT FACE/NK/HF/G 1 ICD-10 Diagnosis Description T81.31XA Disruption of external operation (surgical) wound, not elsewhere classified, initial encounter E11.621 Type 2 diabetes  mellitus with foot ulcer L97.512 Non-pressure chronic ulcer of other part of right foot with fat layer exposed Electronic Signature(s) Signed: 01/24/2018 1:32:49 PM By: Worthy Keeler PA-C Previous Signature: 01/21/2018 5:21:58 PM Version By: Montey Hora Entered By: Worthy Keeler on 01/22/2018 07:29:10

## 2018-01-28 ENCOUNTER — Encounter: Payer: Medicare Other | Admitting: Physician Assistant

## 2018-01-28 DIAGNOSIS — T8189XA Other complications of procedures, not elsewhere classified, initial encounter: Secondary | ICD-10-CM | POA: Diagnosis not present

## 2018-01-28 DIAGNOSIS — E119 Type 2 diabetes mellitus without complications: Secondary | ICD-10-CM | POA: Diagnosis not present

## 2018-01-28 DIAGNOSIS — T8131XA Disruption of external operation (surgical) wound, not elsewhere classified, initial encounter: Secondary | ICD-10-CM | POA: Diagnosis not present

## 2018-01-28 DIAGNOSIS — E11621 Type 2 diabetes mellitus with foot ulcer: Secondary | ICD-10-CM | POA: Diagnosis not present

## 2018-01-28 DIAGNOSIS — I2510 Atherosclerotic heart disease of native coronary artery without angina pectoris: Secondary | ICD-10-CM | POA: Diagnosis not present

## 2018-01-28 DIAGNOSIS — L97512 Non-pressure chronic ulcer of other part of right foot with fat layer exposed: Secondary | ICD-10-CM | POA: Diagnosis not present

## 2018-01-28 DIAGNOSIS — I110 Hypertensive heart disease with heart failure: Secondary | ICD-10-CM | POA: Diagnosis not present

## 2018-01-30 NOTE — Progress Notes (Signed)
KOHNER, ORLICK ([id number]) Visit Report for 01/28/2018 Arrival Information Details Patient Name: [name], [name]. Date of Service: 01/28/2018 10:15 AM Medical Record Number: [id number] Patient Account Number: [id number] Date of Birth/Sex: 08-Apr-1945 (72 y.o. M) Treating RN: Montey Hora Primary Care Ashonte Angelucci: Yaakov Guthrie Other Clinician: Referring Raeleigh Guinn: Yaakov Guthrie Treating Wren Gallaga/Extender: Melburn Hake, HOYT Weeks in Treatment: 6 Visit Information History Since Last Visit Added or deleted any medications: No Patient Arrived: Ambulatory Any new allergies or adverse reactions: No Arrival Time: 10:12 Had a fall or experienced change in No Accompanied By: self activities of daily living that may affect Transfer Assistance: None risk of falls: Patient Identification Verified: Yes Signs or symptoms of abuse/neglect since last visito No Secondary Verification Process Yes Hospitalized since last visit: No Completed: Implantable device outside of the clinic excluding No Patient Has Alerts: Yes cellular tissue based products placed in the center Patient Alerts: DMII since last visit: ABI 12/24/17 L 1.2 R Has Dressing in Place as Prescribed: Yes 1.2 Pain Present Now: Yes Electronic Signature(s) Signed: 01/28/2018 2:49:09 PM By: Lorine Bears RCP, RRT, CHT Entered By: Lorine Bears on 01/28/2018 10:13:07 Greeson, [name] ([id number]) -------------------------------------------------------------------------------- Encounter Discharge Information Details Patient Name: [name] Date of Service: 01/28/2018 10:15 AM Medical Record Number: [id number] Patient Account Number: [id number] Date of Birth/Sex: Jan 23, 1946 (72 y.o. M) Treating RN: Montey Hora Primary Care Denay Pleitez: Yaakov Guthrie Other Clinician: Referring Zaiyden Strozier: Yaakov Guthrie Treating Niajah Sipos/Extender: Melburn Hake, HOYT Weeks in Treatment: 6 Encounter Discharge Information  Items Post Procedure Vitals Discharge Condition: Stable Temperature (F): 98.3 Ambulatory Status: Ambulatory Pulse (bpm): 63 Discharge Destination: Home Respiratory Rate (breaths/min): 16 Transportation: Private Auto Blood Pressure (mmHg): 143/84 Accompanied By: self Schedule Follow-up Appointment: Yes Clinical Summary of Care: Electronic Signature(s) Signed: 01/28/2018 5:03:03 PM By: Montey Hora Entered By: Montey Hora on 01/28/2018 11:06:29 [place], [name] ([id number]) -------------------------------------------------------------------------------- Lower Extremity Assessment Details Patient Name: [name] Date of Service: 01/28/2018 10:15 AM Medical Record Number: [id number] Patient Account Number: [id number] Date of Birth/Sex: 08/17/1945 (72 y.o. M) Treating RN: Cornell Barman Primary Care Julya Alioto: Yaakov Guthrie Other Clinician: Referring Klayten Jolliff: Yaakov Guthrie Treating Darrell Leonhardt/Extender: Melburn Hake, HOYT Weeks in Treatment: 6 Edema Assessment Assessed: [Left: No] [Right: No] Edema: [Left: N] [Right: o] Vascular Assessment Pulses: Dorsalis Pedis Palpable: [Right:Yes] Posterior Tibial Extremity colors, hair growth, and conditions: Extremity Color: [Right:Normal] Hair Growth on Extremity: [Right:No] Temperature of Extremity: [Right:Warm] Capillary Refill: [Right:< 3 seconds] Toe Nail Assessment Left: Right: Thick: No Discolored: No Deformed: No Improper Length and Hygiene: No Electronic Signature(s) Signed: 01/28/2018 4:48:54 PM By: Gretta Cool, BSN, RN, CWS, Kim RN, BSN Entered By: Gretta Cool, BSN, RN, CWS, Kim on 01/28/2018 10:21:33 [name] ([id number]) -------------------------------------------------------------------------------- Multi Wound Chart Details Patient Name: [name] Date of Service: 01/28/2018 10:15 AM Medical Record Number: [id number] Patient Account Number: [id number] Date of Birth/Sex: 1946-01-15 (72 y.o. M) Treating RN: Cornell Barman Primary Care Copelyn Widmer: Yaakov Guthrie Other Clinician: Referring Julies Carmickle: Yaakov Guthrie Treating Rudolpho Claxton/Extender: Melburn Hake, HOYT Weeks in Treatment: 6 Vital Signs Height(in): 77 Pulse(bpm): 63 Weight(lbs): 240 Blood Pressure(mmHg): 143/84 Body Mass Index(BMI): 28 Temperature(F): 98.3 Respiratory Rate 16 (breaths/min): Photos: [1:No Photos] [N/A:N/A] Wound Location: [1:Right Foot - Dorsal] [N/A:N/A] Wounding Event: [1:Surgical Injury] [N/A:N/A] Primary Etiology: [1:Diabetic Wound/Ulcer of the N/A Lower Extremity] Secondary Etiology: [1:Open Surgical Wound] [N/A:N/A] Comorbid History: [1:Coronary Artery Disease, Hypertension, Type II Diabetes] [N/A:N/A] Date Acquired: [1:10/30/2017] [N/A:N/A] Weeks of Treatment: [1:6] [N/A:N/A] Wound Status: [1:Open] [N/A:N/A] Measurements L x W x  D [1:0.7x0.2x0.4] [N/A:N/A] (cm) Area (cm) : [1:0.11] [N/A:N/A] Volume (cm) : [1:0.044] [N/A:N/A] % Reduction in Area: [1:80.50%] [N/A:N/A] % Reduction in Volume: [1:95.10%] [N/A:N/A] Classification: [1:Grade 1] [N/A:N/A] Exudate Amount: [1:Medium] [N/A:N/A] Exudate Type: [1:Sanguinous] [N/A:N/A] Exudate Color: [1:red] [N/A:N/A] Wound Margin: [1:Flat and Intact] [N/A:N/A] Granulation Amount: [1:Medium (34-66%)] [N/A:N/A] Granulation Quality: [1:Pink] [N/A:N/A] Necrotic Amount: [1:Medium (34-66%)] [N/A:N/A] Exposed Structures: [1:Fat Layer (Subcutaneous Tissue) Exposed: Yes Fascia: No Tendon: No Muscle: No Joint: No Bone: No] [N/A:N/A] Epithelialization: [1:Medium (34-66%)] [N/A:N/A] Periwound Skin Texture: [1:Induration: Yes Excoriation: No Callus: No Crepitus: No] [N/A:N/A] Rash: No Scarring: No Periwound Skin Moisture: Maceration: No N/A N/A Dry/Scaly: No Periwound Skin Color: Atrophie Blanche: No N/A N/A Cyanosis: No Ecchymosis: No Erythema: No Hemosiderin Staining: No Mottled: No Pallor: No Rubor: No Temperature: No Abnormality N/A N/A Tenderness on Palpation: No N/A  N/A Wound Preparation: Ulcer Cleansing: N/A N/A Rinsed/Irrigated with Saline Topical Anesthetic Applied: Other: lidocaine 4% Treatment Notes Electronic Signature(s) Signed: 01/28/2018 4:48:54 PM By: Gretta Cool, BSN, RN, CWS, Kim RN, BSN Entered By: Gretta Cool, BSN, RN, CWS, Kim on 01/28/2018 10:21:44 [name] ([id number]) -------------------------------------------------------------------------------- Multi-Disciplinary Care Plan Details Patient Name: [name], [name]. Date of Service: 01/28/2018 10:15 AM Medical Record Number: [id number] Patient Account Number: [id number] Date of Birth/Sex: 1945-08-04 (72 y.o. M) Treating RN: Cornell Barman Primary Care Latanja Lehenbauer: Yaakov Guthrie Other Clinician: Referring Kailena Lubas: Yaakov Guthrie Treating Neria Procter/Extender: Melburn Hake, HOYT Weeks in Treatment: 6 Active Inactive ` Orientation to the Wound Care Program Nursing Diagnoses: Knowledge deficit related to the wound healing center program Goals: Patient/caregiver will verbalize understanding of the [facility] Program Date Initiated: 12/16/2017 Target Resolution Date: 01/06/2018 Goal Status: Active Interventions: Provide education on orientation to the wound center Notes: ` Wound/Skin Impairment Nursing Diagnoses: Impaired tissue integrity Goals: Patient/caregiver will verbalize understanding of skin care regimen Date Initiated: 12/16/2017 Target Resolution Date: 01/12/2018 Goal Status: Active Ulcer/skin breakdown will have a volume reduction of 30% by week 4 Date Initiated: 12/16/2017 Target Resolution Date: 01/12/2018 Goal Status: Active Interventions: Assess patient/caregiver ability to obtain necessary supplies Assess patient/caregiver ability to perform ulcer/skin care regimen upon admission and as needed Assess ulceration(s) every visit Treatment Activities: Skin care regimen initiated : 12/16/2017 Notes: Electronic Signature(s) Signed: 01/28/2018 4:48:54 PM By: Gretta Cool,  BSN, RN, CWS, Kim RN, BSN [place], [place] ([id number]) Entered By: Gretta Cool, BSN, RN, CWS, Kim on 01/28/2018 10:21:37 [name] ([id number]) -------------------------------------------------------------------------------- Pain Assessment Details Patient Name: [name] Date of Service: 01/28/2018 10:15 AM Medical Record Number: [id number] Patient Account Number: [id number] Date of Birth/Sex: [birth year] (72 y.o. M) Treating RN: Montey Hora Primary Care Marwin Primmer: Yaakov Guthrie Other Clinician: Referring Jakyiah Briones: Yaakov Guthrie Treating Kimm Ungaro/Extender: Melburn Hake, HOYT Weeks in Treatment: 6 Active Problems Location of Pain Severity and Description of Pain Patient Has Paino Yes Site Locations Rate the pain. Current Pain Level: 2 Pain Management and Medication Current Pain Management: Electronic Signature(s) Signed: 01/28/2018 2:49:09 PM By: Lorine Bears RCP, RRT, CHT Signed: 01/28/2018 5:03:03 PM By: Montey Hora Entered By: Lorine Bears on 01/28/2018 10:13:19 [name] ([id number]) -------------------------------------------------------------------------------- Patient/Caregiver Education Details Patient Name: [name] Date of Service: 01/28/2018 10:15 AM Medical Record Number: [id number] Patient Account Number: [id number] Date of Birth/Gender: 1945/08/15 (72 y.o. M) Treating RN: Montey Hora Primary Care Physician: Yaakov Guthrie Other Clinician: Referring Physician: Yaakov Guthrie Treating Physician/Extender: Sharalyn Ink in Treatment: 6 Education Assessment Education Provided To: Patient Education Topics Provided Wound/Skin Impairment: Handouts: Other: dressing change if needed Methods:  Demonstration, Explain/Verbal Responses: State content correctly Electronic Signature(s) Signed: 01/28/2018 5:03:03 PM By: Montey Hora Entered By: Montey Hora on 01/28/2018 11:05:34 Stapp, [name]  ([id number]) -------------------------------------------------------------------------------- Wound Assessment Details Patient Name: [name] Date of Service: 01/28/2018 10:15 AM Medical Record Number: [id number] Patient Account Number: [id number] Date of Birth/Sex: 11-12-1945 (72 y.o. M) Treating RN: Cornell Barman Primary Care Kimila Papaleo: Yaakov Guthrie Other Clinician: Referring Gabriell Daigneault: Yaakov Guthrie Treating Aarica Wax/Extender: Melburn Hake, HOYT Weeks in Treatment: 6 Wound Status Wound Number: 1 Primary Etiology: Diabetic Wound/Ulcer of the Lower Extremity Wound Location: Right Foot - Dorsal Secondary Open Surgical Wound Wounding Event: Surgical Injury Etiology: Date Acquired: 10/30/2017 Wound Status: Open Weeks Of Treatment: 6 Comorbid Coronary Artery Disease, Hypertension, Clustered Wound: No History: Type II Diabetes Photos Photo Uploaded By: Gretta Cool, BSN, RN, CWS, Kim on 01/29/2018 11:09:54 Wound Measurements Length: (cm) 0.7 Width: (cm) 0.2 Depth: (cm) 0.4 Area: (cm) 0.11 Volume: (cm) 0.044 % Reduction in Area: 80.5% % Reduction in Volume: 95.1% Epithelialization: Medium (34-66%) Tunneling: No Undermining: No Wound Description Classification: Grade 1 Foul Odor Wound Margin: Flat and Intact Slough/Fi Exudate Amount: Medium Exudate Type: Sanguinous Exudate Color: red After Cleansing: No brino Yes Wound Bed Granulation Amount: Medium (34-66%) Exposed Structure Granulation Quality: Pink Fascia Exposed: No Necrotic Amount: Medium (34-66%) Fat Layer (Subcutaneous Tissue) Exposed: Yes Necrotic Quality: Adherent Slough Tendon Exposed: No Muscle Exposed: No Joint Exposed: No Bone Exposed: No Periwound Skin Texture Swindell, Cire G. ([id number]) Texture Color No Abnormalities Noted: No No Abnormalities Noted: No Callus: No Atrophie Blanche: No Crepitus: No Cyanosis: No Excoriation: No Ecchymosis: No Induration: Yes Erythema: No Rash: No Hemosiderin  Staining: No Scarring: No Mottled: No Pallor: No Moisture Rubor: No No Abnormalities Noted: No Dry / Scaly: No Temperature / Pain Maceration: No Temperature: No Abnormality Wound Preparation Ulcer Cleansing: Rinsed/Irrigated with Saline Topical Anesthetic Applied: Other: lidocaine 4%, Treatment Notes Wound #1 (Right, Dorsal Foot) Notes grafix pl applied in clinic today with mepitel and steri strips, silvercel on top and BFD Electronic Signature(s) Signed: 01/28/2018 4:48:54 PM By: Gretta Cool, BSN, RN, CWS, Kim RN, BSN Entered By: Gretta Cool, BSN, RN, CWS, Kim on 01/28/2018 10:19:58 [name] ([id number]) -------------------------------------------------------------------------------- Vitals Details Patient Name: [name] Date of Service: 01/28/2018 10:15 AM Medical Record Number: [id number] Patient Account Number: [id number] Date of Birth/Sex: 1946/03/15 (72 y.o. M) Treating RN: Montey Hora Primary Care Darlinda Bellows: Yaakov Guthrie Other Clinician: Referring Jenissa Tyrell: Yaakov Guthrie Treating Derra Shartzer/Extender: Melburn Hake, HOYT Weeks in Treatment: 6 Vital Signs Time Taken: 10:12 Temperature (F): 98.3 Height (in): 77 Pulse (bpm): 63 Weight (lbs): 240 Respiratory Rate (breaths/min): 16 Body Mass Index (BMI): 28.5 Blood Pressure (mmHg): 143/84 Reference Range: 80 - 120 mg / dl Electronic Signature(s) Signed: 01/28/2018 2:49:09 PM By: Lorine Bears RCP, RRT, CHT Entered By: Lorine Bears on 01/28/2018 10:16:15

## 2018-01-30 NOTE — Progress Notes (Signed)
[name], [name] ([id number]) Visit Report for 01/28/2018 Chief Complaint Document Details Patient Name: [name], [name]. Date of Service: 01/28/2018 10:15 AM Medical Record Number: [id number] Patient Account Number: [id number] Date of Birth/Sex: [birth year] (72 y.o. M) Treating RN: Montey Hora Primary Care Provider: Yaakov Guthrie Other Clinician: Referring Provider: Yaakov Guthrie Treating Provider/Extender: Melburn Hake, Soua Caltagirone Weeks in Treatment: 6 Information Obtained from: Patient Chief Complaint Right foot surgical wound Electronic Signature(s) Signed: 01/28/2018 5:50:45 PM By: Worthy Keeler PA-C Entered By: Worthy Keeler on 01/28/2018 10:25:44 [name], [name] ([id number]) -------------------------------------------------------------------------------- Cellular or Tissue Based Product Details Patient Name: [name], [name] Date of Service: 01/28/2018 10:15 AM Medical Record Number: [id number] Patient Account Number: [id number] Date of Birth/Sex: 1945-10-08 (72 y.o. M) Treating RN: Montey Hora Primary Care Provider: Yaakov Guthrie Other Clinician: Referring Provider: Yaakov Guthrie Treating Provider/Extender: Melburn Hake, Thomasa Heidler Weeks in Treatment: 6 Cellular or Tissue Based Wound #1 Right,Dorsal Foot Product Type Applied to: Performed By: Physician STONE III, Edgerrin Correia E., PA-C Cellular or Tissue Based Other Product Type: Level of Consciousness (Pre- Awake and Alert procedure): Pre-procedure Verification/Time Yes - 11:02 Out Taken: Location: genitalia / hands / feet / multiple digits Wound Size (sq cm): 0.14 Product Size (sq cm): 3 Waste Size (sq cm): 0 Amount of Product Applied (sq cm): 3 Instrument Used: Forceps, Scissors Lot #: [id number] Expiration Date: 08/22/2018 Fenestrated: No Reconstituted: Yes Solution Type: normal saline Solution Amount: 2 ML Lot #: [id number] Solution Expiration Date: 06/18/2019 Secured: Yes Secured With: Steri-Strips Dressing Applied: Yes Primary  Dressing: mepitel one Procedural Pain: 0 Post Procedural Pain: 0 Response to Treatment: Procedure was tolerated well Level of Consciousness (Post- Awake and Alert procedure): Post Procedure Diagnosis Same as Pre-procedure Electronic Signature(s) Signed: 01/28/2018 5:03:03 PM By: Montey Hora Entered By: Montey Hora on 01/28/2018 11:03:45 [name], [name] ([id number]) -------------------------------------------------------------------------------- HPI Details Patient Name: [name] Date of Service: 01/28/2018 10:15 AM Medical Record Number: [id number] Patient Account Number: [id number] Date of Birth/Sex: 1945-06-04 (72 y.o. M) Treating RN: Montey Hora Primary Care Provider: Yaakov Guthrie Other Clinician: Referring Provider: Yaakov Guthrie Treating Provider/Extender: Melburn Hake, Ram Haugan Weeks in Treatment: 6 History of Present Illness HPI Description: 12/16/17 on evaluation today patient presents concerning an issue that he had which began roughly 2 years ago which included pain with walking and having had injections previously along with inserts which he obtained from the [facility]. Unfortunately throughout all this he was not getting significantly. Therefore he did present to Dr. 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 Treatment options were discussed included conservative versus surgical. The patient proceed with surgical intervention at that time. Subsequently the patient ended up undergoing surgery apparently on 10/31/17 although I do not see the actual surgical note when I look for epic. Subsequently he was seen back for reevaluation on 11/06/17 and since that time has been undergoing dressing changes at Dr. Amalia Hailey office. With that being said wound care has been actually performed in their office on a regular basis since then he's been going in getting the dressings changed and rewrap and  subsequently he had a significant issue where this became inflamed/infected and he was placed on Cipro 500 mg. He actually is finishing that up today and in fact the foot itself actually appears to be doing well as far as I'm concerned. Nonetheless they have been performing good wound care according to what I'm seeing in the clinic  where they were performing all of his dressing changes. Subsequently that has been from October 31, 2017 through current when I evaluated him today. The patient's wound culture which was obtained and completed did show Staphylococcus aureus and E. coli both sensitive to Cipro which is why this was utilize. The patient today states that he really wants to try to get this thing closed as quickly as possible. Patient has a history of diabetes mellitus type II with a hemoglobin A1c on 10/15/17 of 8.4. He also has a history of hypertension which is fairly well controlled. 12/23/17 on evaluation today patient actually appears to be doing better in regard to his foot ulcer. The overall appearance of the wound bed is dramatically improved compared to previous. He does not appear to have had any issues with bleeding after the chemical cauterization last week. Overall I feel like he's making good signs of improvement we have gotten approval for the Grafix PL. 01/07/18 but evaluation today patient actually appears to be doing better in regard to the wound bed at this point which is good news. There still is tendon exposed in the base of the wound but fortunately he seems to be making some signs of progress. This is good and I do believe the wound bed is ready for the application of the Grafix PL at this point. He has been tolerating the dressing's at home without complication and he is been doing excellent job taking care of this in my pinion. 01/13/18 on evaluation today patient presents for his second application of Grafix PL. He tolerated the first application without complication.  He did end up performing the dressing change although he states when he changed the outer dressing that the mepitel came off with the dressing and the packing strip came out as well. Nonetheless he states the dressing really was not to wet or saturated he mainly just did this out of habit. I believe that this go-round I would like for him to just attempt to leave this in place not removing the packing for any reason other than saturation of the dressing. Other than that I do believe he is appropriate for the second application of Grafix PL. 01/21/18 evaluation today patient's wound actually show signs of excellent granulation. He has been tolerating the Grafix PL without complication at this point. Overall I do feel like that the granulation noted has been quite significant over the period of time that we treated him just for two weeks currently. He's been showing signs of good granulation base of the wound and specifically the tendon which was showing is no longer present all of which is excellent. In general I've been extremely happy with the overall progress. The patient likewise is pleased with how things are progressing. 01/28/18 on evaluation today patient actually appears to be doing wonderful in regard to his ulcer on his foot. He has been tolerating the Grafix PL in an excellent fashion and overall I do feel like he's made significant and rapid progress in general. The wound today appears to be doing much better. Electronic Signature(s) Signed: 01/28/2018 5:50:45 PM By: Theodoro Clock, [place]. ([id number]) Entered By: Worthy Keeler on 01/28/2018 14:40:23 [name], [name] ([id number]) -------------------------------------------------------------------------------- Physical Exam Details Patient Name: SABEN, DONIGAN Date of Service: 01/28/2018 10:15 AM Medical Record Number: [id number] Patient Account Number: [id number] Date of Birth/Sex: [birth year] (72 y.o. M) Treating RN:  Montey Hora Primary Care Provider: Yaakov Guthrie Other Clinician: Referring Provider: Yaakov Guthrie Treating  Provider/Extender: STONE III, Yuritzi Kamp Weeks in Treatment: 6 Constitutional Well-nourished and well-hydrated in no acute distress. Respiratory normal breathing without difficulty. Psychiatric this patient is able to make decisions and demonstrates good insight into disease process. Alert and Oriented x 3. pleasant and cooperative. Notes Patient's wound bed currently showed just a very small slip of an opening he does still have some depth but nothing that went all the way down to the tendon as it did before. In fact I could not see any structure visible in the base of the wound. I've been having to pack the wound with a packing strip that does not seem to be the case today. Nonetheless I think that it would be appropriate for [place] to go ahead and apply the next Grafix PL today he was in agreement with this plan no sharp debridement required prior. I did pack the area with the Grafix PL and then cover this with a contact layer prior to securing with Steri-Strips. We will subsequently see him back for reevaluation in one weeks time. If anything changes in the meantime he'll let me know otherwise I'm hopeful this will continue to show signs of good improvement. Electronic Signature(s) Signed: 01/28/2018 5:50:45 PM By: Worthy Keeler PA-C Entered By: Worthy Keeler on 01/28/2018 14:41:30 [name], [name] ([id number]) -------------------------------------------------------------------------------- Physician Orders Details Patient Name: [name] Date of Service: 01/28/2018 10:15 AM Medical Record Number: [id number] Patient Account Number: [id number] Date of Birth/Sex: Sep 24, 1945 (72 y.o. M) Treating RN: Montey Hora Primary Care Provider: Yaakov Guthrie Other Clinician: Referring Provider: Yaakov Guthrie Treating Provider/Extender: Melburn Hake, Dymond Spreen Weeks in Treatment: 6 Verbal  / Phone Orders: No Diagnosis Coding ICD-10 Coding Code Description T81.31XA Disruption of external operation (surgical) wound, not elsewhere classified, initial encounter E11.621 Type 2 diabetes mellitus with foot ulcer L97.512 Non-pressure chronic ulcer of other part of right foot with fat layer exposed I10 Essential (primary) hypertension Wound Cleansing Wound #1 Right,Dorsal Foot o Clean wound with Normal Saline. Anesthetic (add to Medication List) Wound #1 Right,Dorsal Foot o Topical Lidocaine 4% cream applied to wound bed prior to debridement (In Clinic Only). Skin Barriers/Peri-Wound Care Wound #1 Right,Dorsal Foot o Skin Prep Primary Wound Dressing Wound #1 Right,Dorsal Foot o Silver Alginate - SilverCel Secondary Dressing Wound #1 Right,Dorsal Foot o Boardered Foam Dressing Dressing Change Frequency Wound #1 Right,Dorsal Foot o Other: - change bandage if needed for drainage Follow-up Appointments Wound #1 Right,Dorsal Foot o Return Appointment in 1 week. Advanced Therapies Wound #1 Right,Dorsal Foot o Grafix PL application in clinic; including contact layer, fixation with steri strips, dry gauze and cover dressing. [name], [name] ([id number]) Electronic Signature(s) Signed: 01/28/2018 5:03:03 PM By: Montey Hora Signed: 01/28/2018 5:50:45 PM By: Worthy Keeler PA-C Entered By: Montey Hora on 01/28/2018 11:04:49 [name], [name] ([id number]) -------------------------------------------------------------------------------- Problem List Details Patient Name: JABE, JEANBAPTISTE Date of Service: 01/28/2018 10:15 AM Medical Record Number: [id number] Patient Account Number: [id number] Date of Birth/Sex: 02-25-1946 (72 y.o. M) Treating RN: Montey Hora Primary Care Provider: Yaakov Guthrie Other Clinician: Referring Provider: Yaakov Guthrie Treating Provider/Extender: Melburn Hake, Rajinder Mesick Weeks in Treatment: 6 Active Problems ICD-10 Evaluated  Encounter Code Description Active Date Today Diagnosis T81.31XA Disruption of external operation (surgical) wound, not 12/16/2017 No Yes elsewhere classified, initial encounter E11.621 Type 2 diabetes mellitus with foot ulcer 12/16/2017 No Yes L97.512 Non-pressure chronic ulcer of other part of right foot with fat 12/16/2017 No Yes layer exposed I10 Essential (primary) hypertension 12/16/2017 No Yes Inactive Problems  Resolved Problems Electronic Signature(s) Signed: 01/28/2018 5:50:45 PM By: Worthy Keeler PA-C Entered By: Worthy Keeler on 01/28/2018 10:24:21 [name], [name] ([id number]) -------------------------------------------------------------------------------- Progress Note Details Patient Name: [name] Date of Service: 01/28/2018 10:15 AM Medical Record Number: [id number] Patient Account Number: [id number] Date of Birth/Sex: 1945-12-18 (72 y.o. M) Treating RN: Montey Hora Primary Care Provider: Yaakov Guthrie Other Clinician: Referring Provider: Yaakov Guthrie Treating Provider/Extender: Melburn Hake, Jahvier Aldea Weeks in Treatment: 6 Subjective Chief Complaint Information obtained from Patient Right foot surgical wound History of Present Illness (HPI) 12/16/17 on evaluation today patient presents concerning an issue that he had which began roughly 2 years ago which included pain with walking and having had injections previously along with inserts which he obtained from the [facility]. Unfortunately throughout all this he was not getting significantly. Therefore he did present to Dr. Daylene Katayama who is a local podiatrist who diagnosed him with a Morton s Neuroma of the second interspace right foot. Treatment options were discussed included conservative versus surgical. The patient proceed with surgical intervention at that time. Subsequently the patient ended up undergoing surgery apparently on 10/31/17 although I do not see the actual surgical note when I look for  epic. Subsequently he was seen back for reevaluation on 11/06/17 and since that time has been undergoing dressing changes at Dr. Amalia Hailey office. With that being said wound care has been actually performed in their office on a regular basis since then he's been going in getting the dressings changed and rewrap and subsequently he had a significant issue where this became inflamed/infected and he was placed on Cipro 500 mg. He actually is finishing that up today and in fact the foot itself actually appears to be doing well as far as I'm concerned. Nonetheless they have been performing good wound care according to what I'm seeing in the clinic where they were performing all of his dressing changes. Subsequently that has been from October 31, 2017 through current when I evaluated him today. The patient's wound culture which was obtained and completed did show Staphylococcus aureus and E. coli both sensitive to Cipro which is why this was utilize. The patient today states that he really wants to try to get this thing closed as quickly as possible. Patient has a history of diabetes mellitus type II with a hemoglobin A1c on 10/15/17 of 8.4. He also has a history of hypertension which is fairly well controlled. 12/23/17 on evaluation today patient actually appears to be doing better in regard to his foot ulcer. The overall appearance of the wound bed is dramatically improved compared to previous. He does not appear to have had any issues with bleeding after the chemical cauterization last week. Overall I feel like he's making good signs of improvement we have gotten approval for the Grafix PL. 01/07/18 but evaluation today patient actually appears to be doing better in regard to the wound bed at this point which is good news. There still is tendon exposed in the base of the wound but fortunately he seems to be making some signs of progress. This is good and I do believe the wound bed is ready for the application  of the Grafix PL at this point. He has been tolerating the dressing's at home without complication and he is been doing excellent job taking care of this in my pinion. 01/13/18 on evaluation today patient presents for his second application of Grafix PL. He tolerated the first application without complication. He did  end up performing the dressing change although he states when he changed the outer dressing that the mepitel came off with the dressing and the packing strip came out as well. Nonetheless he states the dressing really was not to wet or saturated he mainly just did this out of habit. I believe that this go-round I would like for him to just attempt to leave this in place not removing the packing for any reason other than saturation of the dressing. Other than that I do believe he is appropriate for the second application of Grafix PL. 01/21/18 evaluation today patient's wound actually show signs of excellent granulation. He has been tolerating the Grafix PL without complication at this point. Overall I do feel like that the granulation noted has been quite significant over the period of time that we treated him just for two weeks currently. He's been showing signs of good granulation base of the wound and specifically the tendon which was showing is no longer present all of which is excellent. In general I've been extremely happy with the overall progress. The patient likewise is pleased with how things are progressing. 01/28/18 on evaluation today patient actually appears to be doing wonderful in regard to his ulcer on his foot. He has been [place], [name]. ([id number]) tolerating the Grafix PL in an excellent fashion and overall I do feel like he's made significant and rapid progress in general. The wound today appears to be doing much better. Patient History Information obtained from Patient. Family History Cancer - Mother,Father, Diabetes - Paternal Grandparents, Lung Disease -  Mother, Stroke - Father, No family history of Heart Disease, Hereditary Spherocytosis, Hypertension, Kidney Disease, Seizures, Thyroid Problems, Tuberculosis. Social History Never smoker, Marital Status - Married, Alcohol Use - Never, Drug Use - No History, Caffeine Use - Moderate. Medical And Surgical History Notes Cardiovascular HLD Review of Systems (ROS) Constitutional Symptoms (General Health) Denies complaints or symptoms of Fever, Chills. Respiratory The patient has no complaints or symptoms. Cardiovascular The patient has no complaints or symptoms. Psychiatric The patient has no complaints or symptoms. Objective Constitutional Well-nourished and well-hydrated in no acute distress. Vitals Time Taken: 10:12 AM, Height: 77 in, Weight: 240 lbs, BMI: 28.5, Temperature: 98.3 F, Pulse: 63 bpm, Respiratory Rate: 16 breaths/min, Blood Pressure: 143/84 mmHg. Respiratory normal breathing without difficulty. Psychiatric this patient is able to make decisions and demonstrates good insight into disease process. Alert and Oriented x 3. pleasant and cooperative. General Notes: Patient's wound bed currently showed just a very small slip of an opening he does still have some depth but nothing that went all the way down to the tendon as it did before. In fact I could not see any structure visible in the base of the wound. I've been having to pack the wound with a packing strip that does not seem to be the case today. Nonetheless I think that it would be appropriate for [place] to go ahead and apply the next Grafix PL today he was in agreement with this plan no sharp debridement required prior. I did pack the area with the Grafix PL and then cover this with a contact layer prior to ARYON, NHAM. ([id number]) securing with Steri-Strips. We will subsequently see him back for reevaluation in one weeks time. If anything changes in the meantime he'll let me know otherwise I'm hopeful this will  continue to show signs of good improvement. Integumentary (Hair, Skin) Wound #1 status is Open. Original cause of wound was Surgical Injury.  The wound is located on the Right,Dorsal Foot. The wound measures 0.7cm length x 0.2cm width x 0.4cm depth; 0.11cm^2 area and 0.044cm^3 volume. There is Fat Layer (Subcutaneous Tissue) Exposed exposed. There is no tunneling or undermining noted. There is a medium amount of sanguinous drainage noted. The wound margin is flat and intact. There is medium (34-66%) pink granulation within the wound bed. There is a medium (34-66%) amount of necrotic tissue within the wound bed including Adherent Slough. The periwound skin appearance exhibited: Induration. The periwound skin appearance did not exhibit: Callus, Crepitus, Excoriation, Rash, Scarring, Dry/Scaly, Maceration, Atrophie Blanche, Cyanosis, Ecchymosis, Hemosiderin Staining, Mottled, Pallor, Rubor, Erythema. Periwound temperature was noted as No Abnormality. Assessment Active Problems ICD-10 Disruption of external operation (surgical) wound, not elsewhere classified, initial encounter Type 2 diabetes mellitus with foot ulcer Non-pressure chronic ulcer of other part of right foot with fat layer exposed Essential (primary) hypertension Procedures Wound #1 Pre-procedure diagnosis of Wound #1 is a Diabetic Wound/Ulcer of the Lower Extremity located on the Right,Dorsal Foot. A skin graft procedure using a bioengineered skin substitute/cellular or tissue based product was performed by STONE III, Hamilton Marinello E., PA-C with the following instrument(s): Forceps and Scissors. Other was applied and secured with Steri-Strips. 3 sq cm of product was utilized and 0 sq cm was wasted. Post Application, mepitel one was applied. A Time Out was conducted at 11:02, prior to the start of the procedure. The procedure was tolerated well with a pain level of 0 throughout and a pain level of 0 following the procedure. Post procedure  Diagnosis Wound #1: Same as Pre-Procedure . Plan Wound Cleansing: Wound #1 Right,Dorsal Foot: Clean wound with Normal Saline. Anesthetic (add to Medication List): Wound #1 Right,Dorsal Foot: Topical Lidocaine 4% cream applied to wound bed prior to debridement (In Clinic Only). Skin Barriers/Peri-Wound Care: Wound #1 Right,Dorsal Foot: Skin Prep [name], [name] ([id number]) Primary Wound Dressing: Wound #1 Right,Dorsal Foot: Silver Alginate - SilverCel Secondary Dressing: Wound #1 Right,Dorsal Foot: Boardered Foam Dressing Dressing Change Frequency: Wound #1 Right,Dorsal Foot: Other: - change bandage if needed for drainage Follow-up Appointments: Wound #1 Right,Dorsal Foot: Return Appointment in 1 week. Advanced Therapies: Wound #1 Right,Dorsal Foot: Grafix PL application in clinic; including contact layer, fixation with steri strips, dry gauze and cover dressing. I am going to order one additional scan sub for next week in case this is necessary. With that being said it may not be necessary if things continue to heal as rapidly as they seem to have in the past week. The patient is in agreement with this plan. Please see above for specific wound care orders. We will see patient for re-evaluation in 1 week(s) here in the clinic. If anything worsens or changes patient will contact our office for additional recommendations. Electronic Signature(s) Signed: 01/28/2018 5:50:45 PM By: Worthy Keeler PA-C Entered By: Worthy Keeler on 01/28/2018 14:41:56 [name], [name] ([id number]) -------------------------------------------------------------------------------- ROS/PFSH Details Patient Name: [name] Date of Service: 01/28/2018 10:15 AM Medical Record Number: [id number] Patient Account Number: [id number] Date of Birth/Sex: [birth year] (72 y.o. M) Treating RN: Montey Hora Primary Care Provider: Yaakov Guthrie Other Clinician: Referring Provider: Yaakov Guthrie Treating  Provider/Extender: Melburn Hake, Kacia Halley Weeks in Treatment: 6 Information Obtained From Patient Wound History Do you currently have one or more open woundso Yes How many open wounds do you currently haveo 1 Approximately how long have you had your woundso 6 weeks How have you been treating your wound(s) until nowo bandage by  podiatry Has your wound(s) ever healed and then re-openedo No Have you had any lab work done in the past montho No Have you tested positive for an antibiotic resistant organism (MRSA, VRE)o No Have you tested positive for osteomyelitis (bone infection)o No Have you had any tests for circulation on your legso No Constitutional Symptoms (General Health) Complaints and Symptoms: Negative for: Fever; Chills Eyes Medical History: Negative for: Cataracts; Glaucoma; Optic Neuritis Ear/Nose/Mouth/Throat Medical History: Negative for: Chronic sinus problems/congestion; Middle ear problems Hematologic/Lymphatic Medical History: Negative for: Anemia; Hemophilia; Human Immunodeficiency Virus; Lymphedema; Sickle Cell Disease Respiratory Complaints and Symptoms: No Complaints or Symptoms Medical History: Negative for: Aspiration; Asthma; Chronic Obstructive Pulmonary Disease (COPD); Pneumothorax; Sleep Apnea; Tuberculosis Cardiovascular Complaints and Symptoms: No Complaints or Symptoms Medical History: Positive for: Coronary Artery Disease; Hypertension Negative for: Angina; Arrhythmia; Congestive Heart Failure; Deep Vein Thrombosis; Hypotension; Myocardial Infarction; Brandner, Gerrard G. ([id number]) Peripheral Arterial Disease; Peripheral Venous Disease; Phlebitis; Vasculitis Past Medical History Notes: HLD Gastrointestinal Medical History: Negative for: Cirrhosis ; Colitis; Crohnos; Hepatitis A; Hepatitis B; Hepatitis C Endocrine Medical History: Positive for: Type II Diabetes Negative for: Type I Diabetes Treated with: Oral agents Genitourinary Medical  History: Negative for: End Stage Renal Disease Immunological Medical History: Negative for: Lupus Erythematosus; Raynaudos; Scleroderma Integumentary (Skin) Medical History: Negative for: History of Burn; History of pressure wounds Musculoskeletal Medical History: Negative for: Gout; Rheumatoid Arthritis; Osteoarthritis; Osteomyelitis Neurologic Medical History: Negative for: Dementia; Neuropathy; Quadriplegia; Paraplegia; Seizure Disorder Oncologic Medical History: Negative for: Received Chemotherapy; Received Radiation Psychiatric Complaints and Symptoms: No Complaints or Symptoms Medical History: Negative for: Anorexia/bulimia; Confinement Anxiety Immunizations Pneumococcal Vaccine: Received Pneumococcal Vaccination: Yes HAZIM, TREADWAY ([id number]) Implantable Devices Family and Social History Cancer: Yes - Mother,Father; Diabetes: Yes - Paternal Grandparents; Heart Disease: No; Hereditary Spherocytosis: No; Hypertension: No; Kidney Disease: No; Lung Disease: Yes - Mother; Seizures: No; Stroke: Yes - Father; Thyroid Problems: No; Tuberculosis: No; Never smoker; Marital Status - Married; Alcohol Use: Never; Drug Use: No History; Caffeine Use: Moderate; Financial Concerns: No; Food, Clothing or Shelter Needs: No; Support System Lacking: No; Transportation Concerns: No; Advanced Directives: No; Patient does not want information on Advanced Directives Physician Affirmation I have reviewed and agree with the above information. Electronic Signature(s) Signed: 01/28/2018 5:03:03 PM By: Montey Hora Signed: 01/28/2018 5:50:45 PM By: Worthy Keeler PA-C Entered By: Worthy Keeler on 01/28/2018 14:40:42 Greenspan, [name] ([id number]) -------------------------------------------------------------------------------- SuperBill Details Patient Name: [name] Date of Service: 01/28/2018 Medical Record Number: [id number] Patient Account Number: [id number] Date of Birth/Sex:  1945/08/06 (72 y.o. M) Treating RN: Montey Hora Primary Care Provider: Yaakov Guthrie Other Clinician: Referring Provider: Yaakov Guthrie Treating Provider/Extender: Melburn Hake, Giulio Bertino Weeks in Treatment: 6 Diagnosis Coding ICD-10 Codes Code Description T81.31XA Disruption of external operation (surgical) wound, not elsewhere classified, initial encounter E11.621 Type 2 diabetes mellitus with foot ulcer L97.512 Non-pressure chronic ulcer of other part of right foot with fat layer exposed I10 Essential (primary) hypertension Facility Procedures CPT4: Description Modifier Quantity Code [id number] - SKIN SUB GRAFT FACE/NK/HF/G 1 ICD-10 Diagnosis Description T81.31XA Disruption of external operation (surgical) wound, not elsewhere classified, initial encounter E11.621 Type 2 diabetes mellitus  with foot ulcer L97.512 Non-pressure chronic ulcer of other part of right foot with fat layer exposed CPT4: [id number] Q4133- Grafix PL Prime 1.5 x 2 cm 3 Physician Procedures CPT4: Description Modifier Quantity Code [id number] - WC PHYS SKIN SUB GRAFT FACE/NK/HF/G 1 ICD-10 Diagnosis Description T81.31XA Disruption of external operation (surgical)  wound, not elsewhere classified, initial encounter E11.621 Type 2 diabetes  mellitus with foot ulcer L97.512 Non-pressure chronic ulcer of other part of right foot with fat layer exposed Electronic Signature(s) Signed: 01/28/2018 5:03:03 PM By: Montey Hora Signed: 01/28/2018 5:50:45 PM By: Worthy Keeler PA-C Entered By: Montey Hora on 01/28/2018 16:16:38

## 2018-02-04 ENCOUNTER — Encounter: Payer: Medicare Other | Admitting: Physician Assistant

## 2018-02-04 DIAGNOSIS — I110 Hypertensive heart disease with heart failure: Secondary | ICD-10-CM | POA: Diagnosis not present

## 2018-02-04 DIAGNOSIS — E119 Type 2 diabetes mellitus without complications: Secondary | ICD-10-CM | POA: Diagnosis not present

## 2018-02-04 DIAGNOSIS — L97512 Non-pressure chronic ulcer of other part of right foot with fat layer exposed: Secondary | ICD-10-CM | POA: Diagnosis not present

## 2018-02-04 DIAGNOSIS — I2510 Atherosclerotic heart disease of native coronary artery without angina pectoris: Secondary | ICD-10-CM | POA: Diagnosis not present

## 2018-02-04 DIAGNOSIS — T8189XA Other complications of procedures, not elsewhere classified, initial encounter: Secondary | ICD-10-CM | POA: Diagnosis not present

## 2018-02-04 DIAGNOSIS — E11621 Type 2 diabetes mellitus with foot ulcer: Secondary | ICD-10-CM | POA: Diagnosis not present

## 2018-02-04 DIAGNOSIS — T8131XA Disruption of external operation (surgical) wound, not elsewhere classified, initial encounter: Secondary | ICD-10-CM | POA: Diagnosis not present

## 2018-02-06 NOTE — Progress Notes (Signed)
[name] ([id number]) Visit Report for 02/04/2018 Chief Complaint Document Details Patient Name: [name], [name]. Date of Service: 02/04/2018 11:15 AM Medical Record Number: [id number] Patient Account Number: [id number] Date of Birth/Sex: 04-01-1945 (72 y.o. M) Treating RN: Montey Hora Primary Care Provider: Yaakov Guthrie Other Clinician: Referring Provider: Yaakov Guthrie Treating Provider/Extender: Melburn Hake, HOYT Weeks in Treatment: 7 Information Obtained from: Patient Chief Complaint Right foot surgical wound Electronic Signature(s) Signed: 02/04/2018 5:24:21 PM By: Worthy Keeler PA-C Entered By: Worthy Keeler on 02/04/2018 11:21:06 [name], [name] ([id number]) -------------------------------------------------------------------------------- Cellular or Tissue Based Product Details Patient Name: [name] Date of Service: 02/04/2018 11:15 AM Medical Record Number: [id number] Patient Account Number: [id number] Date of Birth/Sex: 1946-01-16 (72 y.o. M) Treating RN: Montey Hora Primary Care Provider: Yaakov Guthrie Other Clinician: Referring Provider: Yaakov Guthrie Treating Provider/Extender: Melburn Hake, HOYT Weeks in Treatment: 7 Cellular or Tissue Based Wound #1 Right,Dorsal Foot Product Type Applied to: Performed By: Physician STONE III, HOYT E., PA-C Cellular or Tissue Based Other Product Type: Level of Consciousness (Pre- Awake and Alert procedure): Pre-procedure Verification/Time Yes - 11:54 Out Taken: Location: genitalia / hands / feet / multiple digits Wound Size (sq cm): 0.6 Product Size (sq cm): 16 Waste Size (sq cm): 8 Waste Reason: wound size Amount of Product Applied (sq cm): 8 Instrument Used: Forceps, Scissors Lot #: [id number] Expiration Date: 04/27/2019 Fenestrated: No Reconstituted: Yes Solution Type: normal saline Solution Amount: 2 ML Lot #: [id number] Solution Expiration Date: 06/18/2019 Secured: Yes Secured With:  Steri-Strips Dressing Applied: Yes Primary Dressing: mepitel one Procedural Pain: 0 Post Procedural Pain: 0 Response to Treatment: Procedure was tolerated well Level of Consciousness (Post- Awake and Alert procedure): Post Procedure Diagnosis Same as Pre-procedure Electronic Signature(s) Signed: 02/04/2018 4:52:40 PM By: Montey Hora Entered By: Montey Hora on 02/04/2018 11:56:58 [name], [name] ([id number]) -------------------------------------------------------------------------------- Debridement Details Patient Name: [name] Date of Service: 02/04/2018 11:15 AM Medical Record Number: [id number] Patient Account Number: [id number] Date of Birth/Sex: May 25, 1945 (72 y.o. M) Treating RN: Montey Hora Primary Care Provider: Yaakov Guthrie Other Clinician: Referring Provider: Yaakov Guthrie Treating Provider/Extender: Melburn Hake, HOYT Weeks in Treatment: 7 Debridement Performed for Wound #1 Right,Dorsal Foot Assessment: Performed By: Physician STONE III, HOYT E., PA-C Debridement Type: Debridement Severity of Tissue Pre Fat layer exposed Debridement: Level of Consciousness (Pre- Awake and Alert procedure): Pre-procedure Verification/Time Yes - 11:44 Out Taken: Start Time: 11:44 Pain Control: Lidocaine 4% Topical Solution Total Area Debrided (L x W): 1 (cm) x 0.6 (cm) = 0.6 (cm) Tissue and other material Eschar, Slough, Biofilm, Fibrin/Exudate, Slough debrided: Level: Non-Viable Tissue Debridement Description: Selective/Open Wound Instrument: Curette Bleeding: None End Time: 11:50 Procedural Pain: 0 Post Procedural Pain: 10 Response to Treatment: Procedure was tolerated well Level of Consciousness Awake and Alert (Post-procedure): Post Debridement Measurements of Total Wound Length: (cm) 0.3 Width: (cm) 0.2 Depth: (cm) 0.2 Volume: (cm) 0.009 Character of Wound/Ulcer Post Debridement: Improved Severity of Tissue Post Debridement: Fat layer  exposed Post Procedure Diagnosis Same as Pre-procedure Electronic Signature(s) Signed: 02/04/2018 4:52:40 PM By: Montey Hora Signed: 02/04/2018 5:24:21 PM By: Worthy Keeler PA-C Entered By: Montey Hora on 02/04/2018 11:52:39 [name], [name] ([id number]) -------------------------------------------------------------------------------- HPI Details Patient Name: [name] Date of Service: 02/04/2018 11:15 AM Medical Record Number: [id number] Patient Account Number: [id number] Date of Birth/Sex: 12/02/1945 (72 y.o. M) Treating RN: Montey Hora Primary Care Provider: Yaakov Guthrie Other Clinician: Referring Provider: Yaakov Guthrie Treating Provider/Extender: Melburn Hake, HOYT Weeks  in Treatment: 7 History of Present Illness HPI Description: 12/16/17 on evaluation today patient presents concerning an issue that he had which began roughly 2 years ago which included pain with walking and having had injections previously along with inserts which he obtained from the [facility]. Unfortunately throughout all this he was not getting significantly. Therefore he did present to Dr. 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 Treatment options were discussed included conservative versus surgical. The patient proceed with surgical intervention at that time. Subsequently the patient ended up undergoing surgery apparently on 10/31/17 although I do not see the actual surgical note when I look for epic. Subsequently he was seen back for reevaluation on 11/06/17 and since that time has been undergoing dressing changes at Dr. Amalia Hailey office. With that being said wound care has been actually performed in their office on a regular basis since then he's been going in getting the dressings changed and rewrap and subsequently he had a significant issue where this became inflamed/infected and he was placed on Cipro 500 mg. He  actually is finishing that up today and in fact the foot itself actually appears to be doing well as far as I'm concerned. Nonetheless they have been performing good wound care according to what I'm seeing in the clinic where they were performing all of his dressing changes. Subsequently that has been from October 31, 2017 through current when I evaluated him today. The patient's wound culture which was obtained and completed did show Staphylococcus aureus and E. coli both sensitive to Cipro which is why this was utilize. The patient today states that he really wants to try to get this thing closed as quickly as possible. Patient has a history of diabetes mellitus type II with a hemoglobin A1c on 10/15/17 of 8.4. He also has a history of hypertension which is fairly well controlled. 12/23/17 on evaluation today patient actually appears to be doing better in regard to his foot ulcer. The overall appearance of the wound bed is dramatically improved compared to previous. He does not appear to have had any issues with bleeding after the chemical cauterization last week. Overall I feel like he's making good signs of improvement we have gotten approval for the Grafix PL. 01/07/18 but evaluation today patient actually appears to be doing better in regard to the wound bed at this point which is good news. There still is tendon exposed in the base of the wound but fortunately he seems to be making some signs of progress. This is good and I do believe the wound bed is ready for the application of the Grafix PL at this point. He has been tolerating the dressing's at home without complication and he is been doing excellent job taking care of this in my pinion. 01/13/18 on evaluation today patient presents for his second application of Grafix PL. He tolerated the first application without complication. He did end up performing the dressing change although he states when he changed the outer dressing that  the mepitel came off with the dressing and the packing strip came out as well. Nonetheless he states the dressing really was not to wet or saturated he mainly just did this out of habit. I believe that this go-round I would like for him to just attempt to leave this in place not removing the packing for any reason other than saturation of the dressing. Other than that I do  believe he is appropriate for the second application of Grafix PL. 01/21/18 evaluation today patient's wound actually show signs of excellent granulation. He has been tolerating the Grafix PL without complication at this point. Overall I do feel like that the granulation noted has been quite significant over the period of time that we treated him just for two weeks currently. He's been showing signs of good granulation base of the wound and specifically the tendon which was showing is no longer present all of which is excellent. In general I've been extremely happy with the overall progress. The patient likewise is pleased with how things are progressing. 01/28/18 on evaluation today patient actually appears to be doing wonderful in regard to his ulcer on his foot. He has been tolerating the Grafix PL in an excellent fashion and overall I do feel like he's made significant and rapid progress in general. The wound today appears to be doing much better. 02/04/18 on evaluation today patient actually appears to be doing very well in regard to his foot ulcer. In fact post debridement it actually appears as just a very small area still open that I did utilize the skin substitute on today. Nonetheless he is having no pain and overall I feel like this is likely within a week or two of complete closure. [name], [name] ([id number]) Electronic Signature(s) Signed: 02/04/2018 5:24:21 PM By: Worthy Keeler PA-C Entered By: Worthy Keeler on 02/04/2018 13:21:03 [name], [name]  ([id number]) -------------------------------------------------------------------------------- Physical Exam Details Patient Name: [name], [name] Date of Service: 02/04/2018 11:15 AM Medical Record Number: [id number] Patient Account Number: [id number] Date of Birth/Sex: [birth year] (72 y.o. M) Treating RN: Montey Hora Primary Care Provider: Yaakov Guthrie Other Clinician: Referring Provider: Yaakov Guthrie Treating Provider/Extender: STONE III, HOYT Weeks in Treatment: 7 Constitutional Well-nourished and well-hydrated in no acute distress. Respiratory normal breathing without difficulty. Psychiatric this patient is able to make decisions and demonstrates good insight into disease process. Alert and Oriented x 3. pleasant and cooperative. Notes Patient's wound bed currently shows evidence of good granulation which is great news. There does not appear to be any evidence of infection and there's also excellent epithelialization around the edges. Overall I feel like this is almost completely closed and maybe so next week with one further application this week. Post debridement in clearing away the necrotic material, drainage, and eschar from the surface of the wound I did proceed to apply the next Grafix PL. Electronic Signature(s) Signed: 02/04/2018 5:24:21 PM By: Worthy Keeler PA-C Entered By: Worthy Keeler on 02/04/2018 13:21:39 [name], [name] ([id number]) -------------------------------------------------------------------------------- Physician Orders Details Patient Name: [name] Date of Service: 02/04/2018 11:15 AM Medical Record Number: [id number] Patient Account Number: [id number] Date of Birth/Sex: 11-Aug-1945 (72 y.o. M) Treating RN: Montey Hora Primary Care Provider: Yaakov Guthrie Other Clinician: Referring Provider: Yaakov Guthrie Treating Provider/Extender: Melburn Hake, HOYT Weeks in Treatment: 7 Verbal / Phone Orders: No Diagnosis Coding ICD-10 Coding Code  Description T81.31XA Disruption of external operation (surgical) wound, not elsewhere classified, initial encounter E11.621 Type 2 diabetes mellitus with foot ulcer L97.512 Non-pressure chronic ulcer of other part of right foot with fat layer exposed I10 Essential (primary) hypertension Wound Cleansing Wound #1 Right,Dorsal Foot o Clean wound with Normal Saline. Anesthetic (add to Medication List) Wound #1 Right,Dorsal Foot o Topical Lidocaine 4% cream applied to wound bed prior to debridement (In Clinic Only). Skin Barriers/Peri-Wound Care Wound #1 Right,Dorsal Foot o Skin Prep Primary Wound Dressing Wound #1 Right,Dorsal  Foot o Silver Alginate - SilverCel Secondary Dressing Wound #1 Right,Dorsal Foot o Boardered Foam Dressing Dressing Change Frequency Wound #1 Right,Dorsal Foot o Other: - change bandage if needed for drainage Follow-up Appointments Wound #1 Right,Dorsal Foot o Return Appointment in 1 week. Advanced Therapies Wound #1 Right,Dorsal Foot o Grafix PL application in clinic; including contact layer, fixation with steri strips, dry gauze and cover dressing. [name], [name] ([id number]) Electronic Signature(s) Signed: 02/04/2018 4:52:40 PM By: Montey Hora Signed: 02/04/2018 5:24:21 PM By: Worthy Keeler PA-C Entered By: Montey Hora on 02/04/2018 11:57:43 [name], [name] ([id number]) -------------------------------------------------------------------------------- Problem List Details Patient Name: [name], [name] Date of Service: 02/04/2018 11:15 AM Medical Record Number: [id number] Patient Account Number: [id number] Date of Birth/Sex: 1946-01-04 (72 y.o. M) Treating RN: Montey Hora Primary Care Provider: Yaakov Guthrie Other Clinician: Referring Provider: Yaakov Guthrie Treating Provider/Extender: Melburn Hake, HOYT Weeks in Treatment: 7 Active Problems ICD-10 Evaluated Encounter Code Description Active Date Today Diagnosis T81.31XA  Disruption of external operation (surgical) wound, not 12/16/2017 No Yes elsewhere classified, initial encounter E11.621 Type 2 diabetes mellitus with foot ulcer 12/16/2017 No Yes L97.512 Non-pressure chronic ulcer of other part of right foot with fat 12/16/2017 No Yes layer exposed I10 Essential (primary) hypertension 12/16/2017 No Yes Inactive Problems Resolved Problems Electronic Signature(s) Signed: 02/04/2018 5:24:21 PM By: Worthy Keeler PA-C Entered By: Worthy Keeler on 02/04/2018 11:21:00 [name], [name] ([id number]) -------------------------------------------------------------------------------- Progress Note Details Patient Name: [name] Date of Service: 02/04/2018 11:15 AM Medical Record Number: [id number] Patient Account Number: [id number] Date of Birth/Sex: [birth year] (72 y.o. M) Treating RN: Montey Hora Primary Care Provider: Yaakov Guthrie Other Clinician: Referring Provider: Yaakov Guthrie Treating Provider/Extender: Melburn Hake, HOYT Weeks in Treatment: 7 Subjective Chief Complaint Information obtained from Patient Right foot surgical wound History of Present Illness (HPI) 12/16/17 on evaluation today patient presents concerning an issue that he had which began roughly 2 years ago which included pain with walking and having had injections previously along with inserts which he obtained from the [facility]. Unfortunately throughout all this he was not getting significantly. Therefore he did present to Dr. Daylene Katayama who is a local podiatrist who diagnosed him with a Morton s Neuroma of the second interspace right foot. Treatment options were discussed included conservative versus surgical. The patient proceed with surgical intervention at that time. Subsequently the patient ended up undergoing surgery apparently on 10/31/17 although I do not see the actual surgical note when I look for epic. Subsequently he was seen back for reevaluation on 11/06/17 and since  that time has been undergoing dressing changes at Dr. Amalia Hailey office. With that being said wound care has been actually performed in their office on a regular basis since then he's been going in getting the dressings changed and rewrap and subsequently he had a significant issue where this became inflamed/infected and he was placed on Cipro 500 mg. He actually is finishing that up today and in fact the foot itself actually appears to be doing well as far as I'm concerned. Nonetheless they have been performing good wound care according to what I'm seeing in the clinic where they were performing all of his dressing changes. Subsequently that has been from October 31, 2017 through current when I evaluated him today. The patient's wound culture which was obtained and completed did show Staphylococcus aureus and E. coli both sensitive to Cipro which is why this was utilize. The patient today states that he really wants to  try to get this thing closed as quickly as possible. Patient has a history of diabetes mellitus type II with a hemoglobin A1c on 10/15/17 of 8.4. He also has a history of hypertension which is fairly well controlled. 12/23/17 on evaluation today patient actually appears to be doing better in regard to his foot ulcer. The overall appearance of the wound bed is dramatically improved compared to previous. He does not appear to have had any issues with bleeding after the chemical cauterization last week. Overall I feel like he's making good signs of improvement we have gotten approval for the Grafix PL. 01/07/18 but evaluation today patient actually appears to be doing better in regard to the wound bed at this point which is good news. There still is tendon exposed in the base of the wound but fortunately he seems to be making some signs of progress. This is good and I do believe the wound bed is ready for the application of the Grafix PL at this point. He has been tolerating the dressing's at  home without complication and he is been doing excellent job taking care of this in my pinion. 01/13/18 on evaluation today patient presents for his second application of Grafix PL. He tolerated the first application without complication. He did end up performing the dressing change although he states when he changed the outer dressing that the mepitel came off with the dressing and the packing strip came out as well. Nonetheless he states the dressing really was not to wet or saturated he mainly just did this out of habit. I believe that this go-round I would like for him to just attempt to leave this in place not removing the packing for any reason other than saturation of the dressing. Other than that I do believe he is appropriate for the second application of Grafix PL. 01/21/18 evaluation today patient's wound actually show signs of excellent granulation. He has been tolerating the Grafix PL without complication at this point. Overall I do feel like that the granulation noted has been quite significant over the period of time that we treated him just for two weeks currently. He's been showing signs of good granulation base of the wound and specifically the tendon which was showing is no longer present all of which is excellent. In general I've been extremely happy with the overall progress. The patient likewise is pleased with how things are progressing. 01/28/18 on evaluation today patient actually appears to be doing wonderful in regard to his ulcer on his foot. He has been [place], JADD GASIOR. ([id number]) tolerating the Grafix PL in an excellent fashion and overall I do feel like he's made significant and rapid progress in general. The wound today appears to be doing much better. 02/04/18 on evaluation today patient actually appears to be doing very well in regard to his foot ulcer. In fact post debridement it actually appears as just a very small area still open that I did utilize the skin  substitute on today. Nonetheless he is having no pain and overall I feel like this is likely within a week or two of complete closure. Patient History Information obtained from Patient. Family History Cancer - Mother,Father, Diabetes - Paternal Grandparents, Lung Disease - Mother, Stroke - Father, No family history of Heart Disease, Hereditary Spherocytosis, Hypertension, Kidney Disease, Seizures, Thyroid Problems, Tuberculosis. Social History Never smoker, Marital Status - Married, Alcohol Use - Never, Drug Use - No History, Caffeine Use - Moderate. Medical And Surgical History Notes Cardiovascular  HLD Review of Systems (ROS) Constitutional Symptoms (General Health) Denies complaints or symptoms of Fever, Chills. Respiratory The patient has no complaints or symptoms. Cardiovascular The patient has no complaints or symptoms. Psychiatric The patient has no complaints or symptoms. Objective Constitutional Well-nourished and well-hydrated in no acute distress. Vitals Time Taken: 11:10 AM, Height: 77 in, Weight: 240 lbs, BMI: 28.5, Temperature: 98.0 F, Pulse: 75 bpm, Respiratory Rate: 16 breaths/min, Blood Pressure: 136/81 mmHg. Respiratory normal breathing without difficulty. Psychiatric this patient is able to make decisions and demonstrates good insight into disease process. Alert and Oriented x 3. pleasant and cooperative. General Notes: Patient's wound bed currently shows evidence of good granulation which is great news. There does not [place], Sylvain G. ([id number]) appear to be any evidence of infection and there's also excellent epithelialization around the edges. Overall I feel like this is almost completely closed and maybe so next week with one further application this week. Post debridement in clearing away the necrotic material, drainage, and eschar from the surface of the wound I did proceed to apply the next Grafix PL. Integumentary (Hair, Skin) Wound #1 status is  Open. Original cause of wound was Surgical Injury. The wound is located on the Right,Dorsal Foot. The wound measures 1cm length x 0.6cm width x 0.1cm depth; 0.471cm^2 area and 0.047cm^3 volume. There is Fat Layer (Subcutaneous Tissue) Exposed exposed. There is no tunneling or undermining noted. There is a medium amount of sanguinous drainage noted. The wound margin is flat and intact. There is no granulation within the wound bed. There is a large (67-100%) amount of necrotic tissue within the wound bed including Eschar. The periwound skin appearance exhibited: Induration. The periwound skin appearance did not exhibit: Callus, Crepitus, Excoriation, Rash, Scarring, Dry/Scaly, Maceration, Atrophie Blanche, Cyanosis, Ecchymosis, Hemosiderin Staining, Mottled, Pallor, Rubor, Erythema. Periwound temperature was noted as No Abnormality. Assessment Active Problems ICD-10 Disruption of external operation (surgical) wound, not elsewhere classified, initial encounter Type 2 diabetes mellitus with foot ulcer Non-pressure chronic ulcer of other part of right foot with fat layer exposed Essential (primary) hypertension Procedures Wound #1 Pre-procedure diagnosis of Wound #1 is a Diabetic Wound/Ulcer of the Lower Extremity located on the Right,Dorsal Foot .Severity of Tissue Pre Debridement is: Fat layer exposed. There was a Selective/Open Wound Non-Viable Tissue Debridement with a total area of 0.6 sq cm performed by STONE III, HOYT E., PA-C. With the following instrument(s): Curette Material removed includes Eschar, Slough, Biofilm, and Fibrin/Exudate after achieving pain control using Lidocaine 4% Topical Solution. No specimens were taken. A time out was conducted at 11:44, prior to the start of the procedure. There was no bleeding. The procedure was tolerated well with a pain level of 0 throughout and a pain level of 10 following the procedure. Post Debridement Measurements: 0.3cm length x 0.2cm width x  0.2cm depth; 0.009cm^3 volume. Character of Wound/Ulcer Post Debridement is improved. Severity of Tissue Post Debridement is: Fat layer exposed. Post procedure Diagnosis Wound #1: Same as Pre-Procedure Pre-procedure diagnosis of Wound #1 is a Diabetic Wound/Ulcer of the Lower Extremity located on the Right,Dorsal Foot. A skin graft procedure using a bioengineered skin substitute/cellular or tissue based product was performed by STONE III, HOYT E., PA-C with the following instrument(s): Forceps and Scissors. Other was applied and secured with Steri-Strips. 8 sq cm of product was utilized and 8 sq cm was wasted due to wound size. Post Application, mepitel one was applied. A Time Out was conducted at 11:54, prior to the start of  the procedure. The procedure was tolerated well with a pain level of 0 throughout and a pain level of 0 following the procedure. Post procedure Diagnosis Wound #1: Same as Pre-Procedure . [name], [name] ([id number]) Plan Wound Cleansing: Wound #1 Right,Dorsal Foot: Clean wound with Normal Saline. Anesthetic (add to Medication List): Wound #1 Right,Dorsal Foot: Topical Lidocaine 4% cream applied to wound bed prior to debridement (In Clinic Only). Skin Barriers/Peri-Wound Care: Wound #1 Right,Dorsal Foot: Skin Prep Primary Wound Dressing: Wound #1 Right,Dorsal Foot: Silver Alginate - SilverCel Secondary Dressing: Wound #1 Right,Dorsal Foot: Boardered Foam Dressing Dressing Change Frequency: Wound #1 Right,Dorsal Foot: Other: - change bandage if needed for drainage Follow-up Appointments: Wound #1 Right,Dorsal Foot: Return Appointment in 1 week. Advanced Therapies: Wound #1 Right,Dorsal Foot: Grafix PL application in clinic; including contact layer, fixation with steri strips, dry gauze and cover dressing. Currently my suggestion is going to be that we go ahead and continue with the above wound care measures for the next week. The patient is in agreement with  plan. We will see were things stand at follow-up. Again I expect the likely switch to just a Prisma dressing if anything is still open come next week. Nonetheless it's very possible this could be completely close which would be even better. If anything changes worsens the patient will let me know. Please see above for specific wound care orders. We will see patient for re-evaluation in 1 week(s) here in the clinic. If anything worsens or changes patient will contact our office for additional recommendations. Electronic Signature(s) Signed: 02/04/2018 5:24:21 PM By: Worthy Keeler PA-C Entered By: Worthy Keeler on 02/04/2018 13:22:09 RAIJON, LINDFORS ([id number]) -------------------------------------------------------------------------------- ROS/PFSH Details Patient Name: [name] Date of Service: 02/04/2018 11:15 AM Medical Record Number: [id number] Patient Account Number: [id number] Date of Birth/Sex: 01/11/1946 (72 y.o. M) Treating RN: Montey Hora Primary Care Provider: Yaakov Guthrie Other Clinician: Referring Provider: Yaakov Guthrie Treating Provider/Extender: Melburn Hake, HOYT Weeks in Treatment: 7 Information Obtained From Patient Wound History Do you currently have one or more open woundso Yes How many open wounds do you currently haveo 1 Approximately how long have you had your woundso 6 weeks How have you been treating your wound(s) until nowo bandage by podiatry Has your wound(s) ever healed and then re-openedo No Have you had any lab work done in the past montho No Have you tested positive for an antibiotic resistant organism (MRSA, VRE)o No Have you tested positive for osteomyelitis (bone infection)o No Have you had any tests for circulation on your legso No Constitutional Symptoms (General Health) Complaints and Symptoms: Negative for: Fever; Chills Eyes Medical History: Negative for: Cataracts; Glaucoma; Optic Neuritis Ear/Nose/Mouth/Throat Medical  History: Negative for: Chronic sinus problems/congestion; Middle ear problems Hematologic/Lymphatic Medical History: Negative for: Anemia; Hemophilia; Human Immunodeficiency Virus; Lymphedema; Sickle Cell Disease Respiratory Complaints and Symptoms: No Complaints or Symptoms Medical History: Negative for: Aspiration; Asthma; Chronic Obstructive Pulmonary Disease (COPD); Pneumothorax; Sleep Apnea; Tuberculosis Cardiovascular Complaints and Symptoms: No Complaints or Symptoms Medical History: Positive for: Coronary Artery Disease; Hypertension Negative for: Angina; Arrhythmia; Congestive Heart Failure; Deep Vein Thrombosis; Hypotension; Myocardial Infarction; Batiz, Xavi G. ([id number]) Peripheral Arterial Disease; Peripheral Venous Disease; Phlebitis; Vasculitis Past Medical History Notes: HLD Gastrointestinal Medical History: Negative for: Cirrhosis ; Colitis; Crohnos; Hepatitis A; Hepatitis B; Hepatitis C Endocrine Medical History: Positive for: Type II Diabetes Negative for: Type I Diabetes Treated with: Oral agents Genitourinary Medical History: Negative for: End Stage Renal Disease Immunological Medical History:  Negative for: Lupus Erythematosus; Raynaudos; Scleroderma Integumentary (Skin) Medical History: Negative for: History of Burn; History of pressure wounds Musculoskeletal Medical History: Negative for: Gout; Rheumatoid Arthritis; Osteoarthritis; Osteomyelitis Neurologic Medical History: Negative for: Dementia; Neuropathy; Quadriplegia; Paraplegia; Seizure Disorder Oncologic Medical History: Negative for: Received Chemotherapy; Received Radiation Psychiatric Complaints and Symptoms: No Complaints or Symptoms Medical History: Negative for: Anorexia/bulimia; Confinement Anxiety Immunizations Pneumococcal Vaccine: Received Pneumococcal Vaccination: Yes KASHON, KRAYNAK ([id number]) Implantable Devices Family and Social History Cancer: Yes -  Mother,Father; Diabetes: Yes - Paternal Grandparents; Heart Disease: No; Hereditary Spherocytosis: No; Hypertension: No; Kidney Disease: No; Lung Disease: Yes - Mother; Seizures: No; Stroke: Yes - Father; Thyroid Problems: No; Tuberculosis: No; Never smoker; Marital Status - Married; Alcohol Use: Never; Drug Use: No History; Caffeine Use: Moderate; Financial Concerns: No; Food, Clothing or Shelter Needs: No; Support System Lacking: No; Transportation Concerns: No; Advanced Directives: No; Patient does not want information on Advanced Directives Physician Affirmation I have reviewed and agree with the above information. Electronic Signature(s) Signed: 02/04/2018 4:52:40 PM By: Montey Hora Signed: 02/04/2018 5:24:21 PM By: Worthy Keeler PA-C Entered By: Worthy Keeler on 02/04/2018 13:21:18 Inda, [name] ([id number]) -------------------------------------------------------------------------------- SuperBill Details Patient Name: [name] Date of Service: 02/04/2018 Medical Record Number: [id number] Patient Account Number: [id number] Date of Birth/Sex: November 15, 1945 (72 y.o. M) Treating RN: Montey Hora Primary Care Provider: Yaakov Guthrie Other Clinician: Referring Provider: Yaakov Guthrie Treating Provider/Extender: Melburn Hake, HOYT Weeks in Treatment: 7 Diagnosis Coding ICD-10 Codes Code Description T81.31XA Disruption of external operation (surgical) wound, not elsewhere classified, initial encounter E11.621 Type 2 diabetes mellitus with foot ulcer L97.512 Non-pressure chronic ulcer of other part of right foot with fat layer exposed I10 Essential (primary) hypertension Facility Procedures CPT4: Description Modifier Quantity Code 32122482 15275 - SKIN SUB GRAFT FACE/NK/HF/G 1 ICD-10 Diagnosis Description T81.31XA Disruption of external operation (surgical) wound, not elsewhere classified, initial encounter L97.512 Non-pressure chronic  ulcer of other part of right foot with  fat layer exposed E11.621 Type 2 diabetes mellitus with foot ulcer CPT4: [id number] Grafix PL 16 mm Disk 2 Physician Procedures CPT4: Description Modifier Quantity Code 8891694 50388 - WC PHYS SKIN SUB GRAFT FACE/NK/HF/G 1 ICD-10 Diagnosis Description T81.31XA Disruption of external operation (surgical) wound, not elsewhere classified, initial encounter L97.512 Non-pressure  chronic ulcer of other part of right foot with fat layer exposed E11.621 Type 2 diabetes mellitus with foot ulcer Electronic Signature(s) Signed: 02/04/2018 5:24:21 PM By: Worthy Keeler PA-C Previous Signature: 02/04/2018 12:53:11 PM Version By: Montey Hora Entered By: Worthy Keeler on 02/04/2018 13:22:15

## 2018-02-07 NOTE — Progress Notes (Signed)
JAMEZ, AMBROCIO ([id number]) Visit Report for 02/04/2018 Arrival Information Details Patient Name: [name], [name]. Date of Service: 02/04/2018 11:15 AM Medical Record Number: [id number] Patient Account Number: [id number] Date of Birth/Sex: 22-Nov-1945 (72 y.o. M) Treating RN: Cornell Barman Primary Care Tara Rud: Yaakov Guthrie Other Clinician: Referring Kaulana Brindle: Yaakov Guthrie Treating Yassine Brunsman/Extender: Melburn Hake, HOYT Weeks in Treatment: 7 Visit Information History Since Last Visit Added or deleted any medications: No Patient Arrived: Ambulatory Any new allergies or adverse reactions: No Arrival Time: 11:10 Had a fall or experienced change in No Accompanied By: self activities of daily living that may affect Transfer Assistance: None risk of falls: Patient Identification Verified: Yes Signs or symptoms of abuse/neglect since last visito No Secondary Verification Process Yes Hospitalized since last visit: No Completed: Implantable device outside of the clinic excluding No Patient Has Alerts: Yes cellular tissue based products placed in the center Patient Alerts: DMII since last visit: ABI 12/24/17 L 1.2 R Has Dressing in Place as Prescribed: Yes 1.2 Pain Present Now: No Electronic Signature(s) Signed: 02/05/2018 5:21:06 PM By: Gretta Cool, BSN, RN, CWS, Kim RN, BSN Entered By: Gretta Cool, BSN, RN, CWS, Kim on 02/04/2018 11:10:38 [name] ([id number]) -------------------------------------------------------------------------------- Lower Extremity Assessment Details Patient Name: [name] Date of Service: 02/04/2018 11:15 AM Medical Record Number: [id number] Patient Account Number: [id number] Date of Birth/Sex: Nov 05, 1945 (72 y.o. M) Treating RN: Cornell Barman Primary Care Jillann Charette: Yaakov Guthrie Other Clinician: Referring Helayne Metsker: Yaakov Guthrie Treating Rossi Silvestro/Extender: Melburn Hake, HOYT Weeks in Treatment: 7 Edema Assessment Assessed: [Left: No] [Right: Yes] Edema:  [Left: N] [Right: o] Vascular Assessment Claudication: Claudication Assessment [Right:None] Pulses: Dorsalis Pedis Palpable: [Right:Yes] Posterior Tibial Extremity colors, hair growth, and conditions: Extremity Color: [Right:Normal] Hair Growth on Extremity: [Right:No] Temperature of Extremity: [Right:Warm] Capillary Refill: [Right:< 3 seconds] Toe Nail Assessment Left: Right: Thick: No Discolored: No Deformed: No Improper Length and Hygiene: No Electronic Signature(s) Signed: 02/05/2018 5:21:06 PM By: Gretta Cool, BSN, RN, CWS, Kim RN, BSN Entered By: Gretta Cool, BSN, RN, CWS, Kim on 02/04/2018 11:14:43 [place], [name] ([id number]) -------------------------------------------------------------------------------- Multi Wound Chart Details Patient Name: [name] Date of Service: 02/04/2018 11:15 AM Medical Record Number: [id number] Patient Account Number: [id number] Date of Birth/Sex: [birth year] (72 y.o. M) Treating RN: Montey Hora Primary Care Takeyah Wieman: Yaakov Guthrie Other Clinician: Referring Telisha Zawadzki: Yaakov Guthrie Treating Rod Majerus/Extender: Melburn Hake, HOYT Weeks in Treatment: 7 Vital Signs Height(in): 77 Pulse(bpm): 75 Weight(lbs): 240 Blood Pressure(mmHg): 136/81 Body Mass Index(BMI): 28 Temperature(F): 98.0 Respiratory Rate 16 (breaths/min): Photos: [1:No Photos] [N/A:N/A] Wound Location: [1:Right Foot - Dorsal] [N/A:N/A] Wounding Event: [1:Surgical Injury] [N/A:N/A] Primary Etiology: [1:Diabetic Wound/Ulcer of the N/A Lower Extremity] Secondary Etiology: [1:Open Surgical Wound] [N/A:N/A] Comorbid History: [1:Coronary Artery Disease, Hypertension, Type II Diabetes] [N/A:N/A] Date Acquired: [1:10/30/2017] [N/A:N/A] Weeks of Treatment: [1:7] [N/A:N/A] Wound Status: [1:Open] [N/A:N/A] Measurements L x W x D [1:1x0.6x0.1] [N/A:N/A] (cm) Area (cm) : [1:0.471] [N/A:N/A] Volume (cm) : [1:0.047] [N/A:N/A] % Reduction in Area: [1:16.60%] [N/A:N/A] % Reduction  in Volume: [1:94.80%] [N/A:N/A] Classification: [1:Grade 1] [N/A:N/A] Exudate Amount: [1:Medium] [N/A:N/A] Exudate Type: [1:Sanguinous] [N/A:N/A] Exudate Color: [1:red] [N/A:N/A] Wound Margin: [1:Flat and Intact] [N/A:N/A] Granulation Amount: [1:None Present (0%)] [N/A:N/A] Necrotic Amount: [1:Large (67-100%)] [N/A:N/A] Necrotic Tissue: [1:Eschar] [N/A:N/A] Exposed Structures: [1:Fat Layer (Subcutaneous Tissue) Exposed: Yes Fascia: No Tendon: No Muscle: No Joint: No Bone: No] [N/A:N/A] Epithelialization: [1:Medium (34-66%)] [N/A:N/A] Periwound Skin Texture: [1:Induration: Yes Excoriation: No Callus: No Crepitus: No] [N/A:N/A] Rash: No Scarring: No Periwound Skin Moisture: Maceration: No N/A N/A Dry/Scaly: No Periwound  Skin Color: Atrophie Blanche: No N/A N/A Cyanosis: No Ecchymosis: No Erythema: No Hemosiderin Staining: No Mottled: No Pallor: No Rubor: No Temperature: No Abnormality N/A N/A Tenderness on Palpation: No N/A N/A Wound Preparation: Ulcer Cleansing: N/A N/A Rinsed/Irrigated with Saline Topical Anesthetic Applied: Other: lidocaine 4% Treatment Notes Electronic Signature(s) Signed: 02/04/2018 4:52:40 PM By: Montey Hora Entered By: Montey Hora on 02/04/2018 11:43:38 Fantini, [name] ([id number]) -------------------------------------------------------------------------------- [place] Details Patient Name: [name] Date of Service: 02/04/2018 11:15 AM Medical Record Number: [id number] Patient Account Number: [id number] Date of Birth/Sex: Apr 04, 1945 (72 y.o. M) Treating RN: Montey Hora Primary Care Henryk Ursin: Yaakov Guthrie Other Clinician: Referring Ade Stmarie: Yaakov Guthrie Treating Naina Sleeper/Extender: Melburn Hake, HOYT Weeks in Treatment: 7 Active Inactive ` Orientation to the Wound Care Program Nursing Diagnoses: Knowledge deficit related to the wound healing center program Goals: Patient/caregiver will verbalize  understanding of the [place] Program Date Initiated: 12/16/2017 Target Resolution Date: 01/06/2018 Goal Status: Active Interventions: Provide education on orientation to the wound center Notes: ` Wound/Skin Impairment Nursing Diagnoses: Impaired tissue integrity Goals: Patient/caregiver will verbalize understanding of skin care regimen Date Initiated: 12/16/2017 Target Resolution Date: 01/12/2018 Goal Status: Active Ulcer/skin breakdown will have a volume reduction of 30% by week 4 Date Initiated: 12/16/2017 Target Resolution Date: 01/12/2018 Goal Status: Active Interventions: Assess patient/caregiver ability to obtain necessary supplies Assess patient/caregiver ability to perform ulcer/skin care regimen upon admission and as needed Assess ulceration(s) every visit Treatment Activities: Skin care regimen initiated : 12/16/2017 Notes: Electronic Signature(s) Signed: 02/04/2018 4:52:40 PM By: Donald Pore, [name] ([id number]) Entered By: Montey Hora on 02/04/2018 11:43:30 Chaires, [name] ([id number]) -------------------------------------------------------------------------------- Pain Assessment Details Patient Name: [name] Date of Service: 02/04/2018 11:15 AM Medical Record Number: [id number] Patient Account Number: [id number] Date of Birth/Sex: 1945-05-28 (72 y.o. M) Treating RN: Cornell Barman Primary Care Kaisey Huseby: Yaakov Guthrie Other Clinician: Referring Tomeika Weinmann: Yaakov Guthrie Treating Shakaya Bhullar/Extender: Melburn Hake, HOYT Weeks in Treatment: 7 Active Problems Location of Pain Severity and Description of Pain Patient Has Paino No Site Locations Pain Management and Medication Current Pain Management: Electronic Signature(s) Signed: 02/05/2018 5:21:06 PM By: Gretta Cool, BSN, RN, CWS, Kim RN, BSN Entered By: Gretta Cool, BSN, RN, CWS, Kim on 02/04/2018 11:10:44 [name]  ([id number]) -------------------------------------------------------------------------------- Patient/Caregiver Education Details Patient Name: [name] Date of Service: 02/04/2018 11:15 AM Medical Record Number: [id number] Patient Account Number: [id number] Date of Birth/Gender: November 21, 1945 (72 y.o. M) Treating RN: Montey Hora Primary Care Physician: Yaakov Guthrie Other Clinician: Referring Physician: Yaakov Guthrie Treating Physician/Extender: Sharalyn Ink in Treatment: 7 Education Assessment Education Provided To: Patient Education Topics Provided Wound/Skin Impairment: Handouts: Caring for Your Ulcer Methods: Demonstration, Explain/Verbal Responses: State content correctly Electronic Signature(s) Signed: 02/04/2018 4:52:40 PM By: Montey Hora Entered By: Montey Hora on 02/04/2018 12:54:05 Sova, [name] ([id number]) -------------------------------------------------------------------------------- Wound Assessment Details Patient Name: [name] Date of Service: 02/04/2018 11:15 AM Medical Record Number: [id number] Patient Account Number: [id number] Date of Birth/Sex: 12/12/1945 (72 y.o. M) Treating RN: Cornell Barman Primary Care Tona Qualley: Yaakov Guthrie Other Clinician: Referring Aran Menning: Yaakov Guthrie Treating Tobe Kervin/Extender: Melburn Hake, HOYT Weeks in Treatment: 7 Wound Status Wound Number: 1 Primary Etiology: Diabetic Wound/Ulcer of the Lower Extremity Wound Location: Right Foot - Dorsal Secondary Open Surgical Wound Wounding Event: Surgical Injury Etiology: Date Acquired: 10/30/2017 Wound Status: Open Weeks Of Treatment: 7 Comorbid Coronary Artery Disease, Hypertension, Clustered Wound: No History: Type II Diabetes Wound Measurements Length: (cm) 1 Width: (cm) 0.6 Depth: (  cm) 0.1 Area: (cm) 0.471 Volume: (cm) 0.047 % Reduction in Area: 16.6% % Reduction in Volume: 94.8% Epithelialization: Medium (34-66%) Tunneling:  No Undermining: No Wound Description Classification: Grade 1 Foul Wound Margin: Flat and Intact Sloug Exudate Amount: Medium Exudate Type: Sanguinous Exudate Color: red Odor After Cleansing: No h/Fibrino Yes Wound Bed Granulation Amount: None Present (0%) Exposed Structure Necrotic Amount: Large (67-100%) Fascia Exposed: No Necrotic Quality: Eschar Fat Layer (Subcutaneous Tissue) Exposed: Yes Tendon Exposed: No Muscle Exposed: No Joint Exposed: No Bone Exposed: No Periwound Skin Texture Texture Color No Abnormalities Noted: No No Abnormalities Noted: No Callus: No Atrophie Blanche: No Crepitus: No Cyanosis: No Excoriation: No Ecchymosis: No Induration: Yes Erythema: No Rash: No Hemosiderin Staining: No Scarring: No Mottled: No Pallor: No Moisture Rubor: No No Abnormalities Noted: No Dry / Scaly: No Temperature / Pain Maceration: No Temperature: No Abnormality Cange, Yussuf G. ([id number]) Wound Preparation Ulcer Cleansing: Rinsed/Irrigated with Saline Topical Anesthetic Applied: Other: lidocaine 4%, Electronic Signature(s) Signed: 02/05/2018 5:21:06 PM By: Gretta Cool, BSN, RN, CWS, Kim RN, BSN Entered By: Gretta Cool, BSN, RN, CWS, Kim on 02/04/2018 11:14:01 [name] ([id number]) -------------------------------------------------------------------------------- Vitals Details Patient Name: [name] Date of Service: 02/04/2018 11:15 AM Medical Record Number: [id number] Patient Account Number: [id number] Date of Birth/Sex: 05/30/1945 (72 y.o. M) Treating RN: Cornell Barman Primary Care Izrael Peak: Yaakov Guthrie Other Clinician: Referring Veleria Barnhardt: Yaakov Guthrie Treating Datra Clary/Extender: Melburn Hake, HOYT Weeks in Treatment: 7 Vital Signs Time Taken: 11:10 Temperature (F): 98.0 Height (in): 77 Pulse (bpm): 75 Weight (lbs): 240 Respiratory Rate (breaths/min): 16 Body Mass Index (BMI): 28.5 Blood Pressure (mmHg): 136/81 Reference Range: 80 - 120 mg /  dl Electronic Signature(s) Signed: 02/05/2018 5:21:06 PM By: Gretta Cool, BSN, RN, CWS, Kim RN, BSN Entered By: Gretta Cool, BSN, RN, CWS, Kim on 02/04/2018 11:11:04

## 2018-02-11 ENCOUNTER — Encounter: Payer: Medicare Other | Admitting: Physician Assistant

## 2018-02-11 DIAGNOSIS — T8189XA Other complications of procedures, not elsewhere classified, initial encounter: Secondary | ICD-10-CM | POA: Diagnosis not present

## 2018-02-11 DIAGNOSIS — I110 Hypertensive heart disease with heart failure: Secondary | ICD-10-CM | POA: Diagnosis not present

## 2018-02-11 DIAGNOSIS — L97512 Non-pressure chronic ulcer of other part of right foot with fat layer exposed: Secondary | ICD-10-CM | POA: Diagnosis not present

## 2018-02-11 DIAGNOSIS — E119 Type 2 diabetes mellitus without complications: Secondary | ICD-10-CM | POA: Diagnosis not present

## 2018-02-11 DIAGNOSIS — T8131XA Disruption of external operation (surgical) wound, not elsewhere classified, initial encounter: Secondary | ICD-10-CM | POA: Diagnosis not present

## 2018-02-11 DIAGNOSIS — I2510 Atherosclerotic heart disease of native coronary artery without angina pectoris: Secondary | ICD-10-CM | POA: Diagnosis not present

## 2018-02-13 NOTE — Progress Notes (Signed)
[name] ([id number]) Visit Report for 02/11/2018 Chief Complaint Document Details Patient Name: [name]. Date of Service: 02/11/2018 11:15 AM Medical Record Number: [id number] Patient Account Number: [id number] Date of Birth/Sex: 1946-01-27 (72 y.o. M) Treating RN: Montey Hora Primary Care Provider: Yaakov Guthrie Other Clinician: Referring Provider: Yaakov Guthrie Treating Provider/Extender: Melburn Hake, Kitti Mcclish Weeks in Treatment: 8 Information Obtained from: Patient Chief Complaint Right foot surgical wound Electronic Signature(s) Signed: 02/12/2018 12:54:11 AM By: Worthy Keeler PA-C Entered By: Worthy Keeler on 02/11/2018 11:22:02 [name], [name] ([id number]) -------------------------------------------------------------------------------- HPI Details Patient Name: [name] Date of Service: 02/11/2018 11:15 AM Medical Record Number: [id number] Patient Account Number: [id number] Date of Birth/Sex: Sep 07, 1945 (72 y.o. M) Treating RN: Montey Hora Primary Care Provider: Yaakov Guthrie Other Clinician: Referring Provider: Yaakov Guthrie Treating Provider/Extender: Melburn Hake, Alix Lahmann Weeks in Treatment: 8 History of Present Illness HPI Description: 12/16/17 on evaluation today patient presents concerning an issue that he had which began roughly 2 years ago which included pain with walking and having had injections previously along with inserts which he obtained from the [facility]. Unfortunately throughout all this he was not getting significantly. Therefore he did present to Dr. 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 Treatment options were discussed included conservative versus surgical. The patient proceed with surgical intervention at that time. Subsequently the patient ended up undergoing surgery apparently on 10/31/17 although I do not see the actual surgical note when I look for  epic. Subsequently he was seen back for reevaluation on 11/06/17 and since that time has been undergoing dressing changes at Dr. Amalia Hailey office. With that being said wound care has been actually performed in their office on a regular basis since then he's been going in getting the dressings changed and rewrap and subsequently he had a significant issue where this became inflamed/infected and he was placed on Cipro 500 mg. He actually is finishing that up today and in fact the foot itself actually appears to be doing well as far as I'm concerned. Nonetheless they have been performing good wound care according to what I'm seeing in the clinic where they were performing all of his dressing changes. Subsequently that has been from October 31, 2017 through current when I evaluated him today. The patient's wound culture which was obtained and completed did show Staphylococcus aureus and E. coli both sensitive to Cipro which is why this was utilize. The patient today states that he really wants to try to get this thing closed as quickly as possible. Patient has a history of diabetes mellitus type II with a hemoglobin A1c on 10/15/17 of 8.4. He also has a history of hypertension which is fairly well controlled. 12/23/17 on evaluation today patient actually appears to be doing better in regard to his foot ulcer. The overall appearance of the wound bed is dramatically improved compared to previous. He does not appear to have had any issues with bleeding after the chemical cauterization last week. Overall I feel like he's making good signs of improvement we have gotten approval for the Grafix PL. 01/07/18 but evaluation today patient actually appears to be doing better in regard to the wound bed at this point which is good news. There still is tendon exposed in the base of the wound but fortunately he seems to be making some signs of progress. This is good and I do believe the wound bed is  ready for the application  of the Grafix PL at this point. He has been tolerating the dressing's at home without complication and he is been doing excellent job taking care of this in my pinion. 01/13/18 on evaluation today patient presents for his second application of Grafix PL. He tolerated the first application without complication. He did end up performing the dressing change although he states when he changed the outer dressing that the mepitel came off with the dressing and the packing strip came out as well. Nonetheless he states the dressing really was not to wet or saturated he mainly just did this out of habit. I believe that this go-round I would like for him to just attempt to leave this in place not removing the packing for any reason other than saturation of the dressing. Other than that I do believe he is appropriate for the second application of Grafix PL. 01/21/18 evaluation today patient's wound actually show signs of excellent granulation. He has been tolerating the Grafix PL without complication at this point. Overall I do feel like that the granulation noted has been quite significant over the period of time that we treated him just for two weeks currently. He's been showing signs of good granulation base of the wound and specifically the tendon which was showing is no longer present all of which is excellent. In general I've been extremely happy with the overall progress. The patient likewise is pleased with how things are progressing. 01/28/18 on evaluation today patient actually appears to be doing wonderful in regard to his ulcer on his foot. He has been tolerating the Grafix PL in an excellent fashion and overall I do feel like he's made significant and rapid progress in general. The wound today appears to be doing much better. 02/04/18 on evaluation today patient actually appears to be doing very well in regard to his foot ulcer. In fact post debridement it actually appears as just a very small area  still open that I did utilize the skin substitute on today. Nonetheless he is having no pain and overall I feel like this is likely within a week or two of complete closure. [name], [name] ([id number]) 02/11/18 on evaluation today patient actually appears to be doing excellent. In fact upon inspection of his wound it appears this is completely healed which is great news. With that being said he is still somewhat reluctant to just want to get in the shower and proceed as normal. You clearly understand this with how things went the last time he thought everything was going well. Nonetheless I think this is a very different scenario I believe this is indeed healed at this point. Electronic Signature(s) Signed: 02/12/2018 12:54:11 AM By: Worthy Keeler PA-C Entered By: Worthy Keeler on 02/12/2018 00:52:19 [place], [name] ([id number]) -------------------------------------------------------------------------------- Physical Exam Details Patient Name: [name], [name] Date of Service: 02/11/2018 11:15 AM Medical Record Number: [id number] Patient Account Number: [id number] Date of Birth/Sex: 02-14-1946 (72 y.o. M) Treating RN: Montey Hora Primary Care Provider: Yaakov Guthrie Other Clinician: Referring Provider: Yaakov Guthrie Treating Provider/Extender: STONE III, Julen Rubert Weeks in Treatment: 8 Constitutional Well-nourished and well-hydrated in no acute distress. Respiratory normal breathing without difficulty. clear to auscultation bilaterally. Cardiovascular regular rate and rhythm with normal S1, S2. Psychiatric this patient is able to make decisions and demonstrates good insight into disease process. Alert and Oriented x 3. pleasant and cooperative. Notes Patient's wound once the eschar which was dried on the top of the surface  along with some dressing was removed appears to show complete epithelialization at this time. There does not appear to be any evidence of infection which is good  news is having no pain. Electronic Signature(s) Signed: 02/12/2018 12:54:11 AM By: Worthy Keeler PA-C Entered By: Worthy Keeler on 02/12/2018 00:53:02 [name], [name] ([id number]) -------------------------------------------------------------------------------- Physician Orders Details Patient Name: [name], [name] Date of Service: 02/11/2018 11:15 AM Medical Record Number: [id number] Patient Account Number: [id number] Date of Birth/Sex: 01-Sep-1945 (72 y.o. M) Treating RN: Montey Hora Primary Care Provider: Yaakov Guthrie Other Clinician: Referring Provider: Yaakov Guthrie Treating Provider/Extender: Melburn Hake, Eduin Friedel Weeks in Treatment: 8 Verbal / Phone Orders: No Diagnosis Coding ICD-10 Coding Code Description T81.31XA Disruption of external operation (surgical) wound, not elsewhere classified, initial encounter E11.621 Type 2 diabetes mellitus with foot ulcer L97.512 Non-pressure chronic ulcer of other part of right foot with fat layer exposed I10 Essential (primary) hypertension Follow-up Appointments o Return Appointment in 2 weeks. Electronic Signature(s) Signed: 02/11/2018 5:02:25 PM By: Montey Hora Signed: 02/12/2018 12:54:11 AM By: Worthy Keeler PA-C Entered By: Montey Hora on 02/11/2018 11:59:14 [name], [name] ([id number]) -------------------------------------------------------------------------------- Problem List Details Patient Name: [name], [name] Date of Service: 02/11/2018 11:15 AM Medical Record Number: [id number] Patient Account Number: [id number] Date of Birth/Sex: 13-Oct-1945 (72 y.o. M) Treating RN: Montey Hora Primary Care Provider: Yaakov Guthrie Other Clinician: Referring Provider: Yaakov Guthrie Treating Provider/Extender: Melburn Hake, Dez Stauffer Weeks in Treatment: 8 Active Problems ICD-10 Evaluated Encounter Code Description Active Date Today Diagnosis T81.31XA Disruption of external operation (surgical) wound, not 12/16/2017 No  Yes elsewhere classified, initial encounter E11.621 Type 2 diabetes mellitus with foot ulcer 12/16/2017 No Yes L97.512 Non-pressure chronic ulcer of other part of right foot with fat 12/16/2017 No Yes layer exposed I10 Essential (primary) hypertension 12/16/2017 No Yes Inactive Problems Resolved Problems Electronic Signature(s) Signed: 02/12/2018 12:54:11 AM By: Worthy Keeler PA-C Entered By: Worthy Keeler on 02/11/2018 11:21:57 [name], [name] ([id number]) -------------------------------------------------------------------------------- Progress Note Details Patient Name: [name] Date of Service: 02/11/2018 11:15 AM Medical Record Number: [id number] Patient Account Number: [id number] Date of Birth/Sex: 07/26/1945 (72 y.o. M) Treating RN: Montey Hora Primary Care Provider: Yaakov Guthrie Other Clinician: Referring Provider: Yaakov Guthrie Treating Provider/Extender: Melburn Hake, Kollins Fenter Weeks in Treatment: 8 Subjective Chief Complaint Information obtained from Patient Right foot surgical wound History of Present Illness (HPI) 12/16/17 on evaluation today patient presents concerning an issue that he had which began roughly 2 years ago which included pain with walking and having had injections previously along with inserts which he obtained from the [facility]. Unfortunately throughout all this he was not getting significantly. Therefore he did present to Dr. Daylene Katayama who is a local podiatrist who diagnosed him with a Morton s Neuroma of the second interspace right foot. Treatment options were discussed included conservative versus surgical. The patient proceed with surgical intervention at that time. Subsequently the patient ended up undergoing surgery apparently on 10/31/17 although I do not see the actual surgical note when I look for epic. Subsequently he was seen back for reevaluation on 11/06/17 and since that time has been undergoing dressing changes at Dr. Amalia Hailey  office. With that being said wound care has been actually performed in their office on a regular basis since then he's been going in getting the dressings changed and rewrap and subsequently he had a significant issue where this became inflamed/infected and he was placed on Cipro 500 mg. He actually is finishing  that up today and in fact the foot itself actually appears to be doing well as far as I'm concerned. Nonetheless they have been performing good wound care according to what I'm seeing in the clinic where they were performing all of his dressing changes. Subsequently that has been from October 31, 2017 through current when I evaluated him today. The patient's wound culture which was obtained and completed did show Staphylococcus aureus and E. coli both sensitive to Cipro which is why this was utilize. The patient today states that he really wants to try to get this thing closed as quickly as possible. Patient has a history of diabetes mellitus type II with a hemoglobin A1c on 10/15/17 of 8.4. He also has a history of hypertension which is fairly well controlled. 12/23/17 on evaluation today patient actually appears to be doing better in regard to his foot ulcer. The overall appearance of the wound bed is dramatically improved compared to previous. He does not appear to have had any issues with bleeding after the chemical cauterization last week. Overall I feel like he's making good signs of improvement we have gotten approval for the Grafix PL. 01/07/18 but evaluation today patient actually appears to be doing better in regard to the wound bed at this point which is good news. There still is tendon exposed in the base of the wound but fortunately he seems to be making some signs of progress. This is good and I do believe the wound bed is ready for the application of the Grafix PL at this point. He has been tolerating the dressing's at home without complication and he is been doing excellent job  taking care of this in my pinion. 01/13/18 on evaluation today patient presents for his second application of Grafix PL. He tolerated the first application without complication. He did end up performing the dressing change although he states when he changed the outer dressing that the mepitel came off with the dressing and the packing strip came out as well. Nonetheless he states the dressing really was not to wet or saturated he mainly just did this out of habit. I believe that this go-round I would like for him to just attempt to leave this in place not removing the packing for any reason other than saturation of the dressing. Other than that I do believe he is appropriate for the second application of Grafix PL. 01/21/18 evaluation today patient's wound actually show signs of excellent granulation. He has been tolerating the Grafix PL without complication at this point. Overall I do feel like that the granulation noted has been quite significant over the period of time that we treated him just for two weeks currently. He's been showing signs of good granulation base of the wound and specifically the tendon which was showing is no longer present all of which is excellent. In general I've been extremely happy with the overall progress. The patient likewise is pleased with how things are progressing. 01/28/18 on evaluation today patient actually appears to be doing wonderful in regard to his ulcer on his foot. He has been [name], Brandon JOBE. ([id number]) tolerating the Grafix PL in an excellent fashion and overall I do feel like he's made significant and rapid progress in general. The wound today appears to be doing much better. 02/04/18 on evaluation today patient actually appears to be doing very well in regard to his foot ulcer. In fact post debridement it actually appears as just a very small area still open that  I did utilize the skin substitute on today. Nonetheless he is having no pain and  overall I feel like this is likely within a week or two of complete closure. 02/11/18 on evaluation today patient actually appears to be doing excellent. In fact upon inspection of his wound it appears this is completely healed which is great news. With that being said he is still somewhat reluctant to just want to get in the shower and proceed as normal. You clearly understand this with how things went the last time he thought everything was going well. Nonetheless I think this is a very different scenario I believe this is indeed healed at this point. Patient History Information obtained from Patient. Family History Cancer - Mother,Father, Diabetes - Paternal Grandparents, Lung Disease - Mother, Stroke - Father, No family history of Heart Disease, Hereditary Spherocytosis, Hypertension, Kidney Disease, Seizures, Thyroid Problems, Tuberculosis. Social History Never smoker, Marital Status - Married, Alcohol Use - Never, Drug Use - No History, Caffeine Use - Moderate. Medical And Surgical History Notes Cardiovascular HLD Review of Systems (ROS) Constitutional Symptoms (General Health) Denies complaints or symptoms of Fever, Chills. Respiratory The patient has no complaints or symptoms. Cardiovascular The patient has no complaints or symptoms. Psychiatric The patient has no complaints or symptoms. Objective Constitutional Well-nourished and well-hydrated in no acute distress. Vitals Time Taken: 11:28 AM, Height: 77 in, Weight: 240 lbs, BMI: 28.5, Temperature: 98.2 F, Pulse: 64 bpm, Respiratory Rate: 16 breaths/min, Blood Pressure: 120/60 mmHg. Respiratory normal breathing without difficulty. clear to auscultation bilaterally. Cardiovascular [name], PETTA. ([id number]) regular rate and rhythm with normal S1, S2. Psychiatric this patient is able to make decisions and demonstrates good insight into disease process. Alert and Oriented x 3. pleasant and cooperative. General Notes:  Patient's wound once the eschar which was dried on the top of the surface along with some dressing was removed appears to show complete epithelialization at this time. There does not appear to be any evidence of infection which is good news is having no pain. Integumentary (Hair, Skin) Wound #1 status is Healed - Epithelialized. Original cause of wound was Surgical Injury. The wound is located on the Right,Dorsal Foot. The wound measures 0cm length x 0cm width x 0cm depth; 0cm^2 area and 0cm^3 volume. There is Fat Layer (Subcutaneous Tissue) Exposed exposed. There is no tunneling or undermining noted. There is a medium amount of sanguinous drainage noted. The wound margin is flat and intact. There is no granulation within the wound bed. There is a large (67-100%) amount of necrotic tissue within the wound bed including Eschar. The periwound skin appearance exhibited: Induration. The periwound skin appearance did not exhibit: Callus, Crepitus, Excoriation, Rash, Scarring, Dry/Scaly, Maceration, Atrophie Blanche, Cyanosis, Ecchymosis, Hemosiderin Staining, Mottled, Pallor, Rubor, Erythema. Periwound temperature was noted as No Abnormality. Assessment Active Problems ICD-10 Disruption of external operation (surgical) wound, not elsewhere classified, initial encounter Type 2 diabetes mellitus with foot ulcer Non-pressure chronic ulcer of other part of right foot with fat layer exposed Essential (primary) hypertension Plan Follow-up Appointments: Return Appointment in 2 weeks. Currently my suggestion is gonna be that he keeps this area covered and protected for the next two weeks. I will see him for reevaluation at that time in two weeks to see were things stand. My opinion is that he will have no trouble between now and then although if he does he knows to give me a call and let me know. Otherwise I expect this to continue to remain healed  close between now and then I which point I would  definitely release him back to normal shoes as well as normal showering without any reservation. Please see above for specific wound care orders. We will see patient for re-evaluation in 2 week(s) here in the clinic. If anything worsens or changes patient will contact our office for additional recommendations. [name], [name] ([id number]) Electronic Signature(s) Signed: 02/12/2018 12:54:11 AM By: Worthy Keeler PA-C Entered By: Worthy Keeler on 02/12/2018 00:53:17 [name], [name] [name] ([id number]) -------------------------------------------------------------------------------- ROS/PFSH Details Patient Name: [name] Date of Service: 02/11/2018 11:15 AM Medical Record Number: [id number] Patient Account Number: [id number] Date of Birth/Sex: Oct 04, 1945 (72 y.o. M) Treating RN: Montey Hora Primary Care Provider: Yaakov Guthrie Other Clinician: Referring Provider: Yaakov Guthrie Treating Provider/Extender: Melburn Hake, Marki Frede Weeks in Treatment: 8 Information Obtained From Patient Wound History Do you currently have one or more open woundso Yes How many open wounds do you currently haveo 1 Approximately how long have you had your woundso 6 weeks How have you been treating your wound(s) until nowo bandage by podiatry Has your wound(s) ever healed and then re-openedo No Have you had any lab work done in the past montho No Have you tested positive for an antibiotic resistant organism (MRSA, VRE)o No Have you tested positive for osteomyelitis (bone infection)o No Have you had any tests for circulation on your legso No Constitutional Symptoms (General Health) Complaints and Symptoms: Negative for: Fever; Chills Eyes Medical History: Negative for: Cataracts; Glaucoma; Optic Neuritis Ear/Nose/Mouth/Throat Medical History: Negative for: Chronic sinus problems/congestion; Middle ear problems Hematologic/Lymphatic Medical History: Negative for: Anemia; Hemophilia; Human Immunodeficiency  Virus; Lymphedema; Sickle Cell Disease Respiratory Complaints and Symptoms: No Complaints or Symptoms Medical History: Negative for: Aspiration; Asthma; Chronic Obstructive Pulmonary Disease (COPD); Pneumothorax; Sleep Apnea; Tuberculosis Cardiovascular Complaints and Symptoms: No Complaints or Symptoms Medical History: Positive for: Coronary Artery Disease; Hypertension Hoe, Zaeem Barajas. ([id number]) Negative for: Angina; Arrhythmia; Congestive Heart Failure; Deep Vein Thrombosis; Hypotension; Myocardial Infarction; Peripheral Arterial Disease; Peripheral Venous Disease; Phlebitis; Vasculitis Past Medical History Notes: HLD Gastrointestinal Medical History: Negative for: Cirrhosis ; Colitis; Crohnos; Hepatitis A; Hepatitis B; Hepatitis C Endocrine Medical History: Positive for: Type II Diabetes Negative for: Type I Diabetes Treated with: Oral agents Genitourinary Medical History: Negative for: End Stage Renal Disease Immunological Medical History: Negative for: Lupus Erythematosus; Raynaudos; Scleroderma Integumentary (Skin) Medical History: Negative for: History of Burn; History of pressure wounds Musculoskeletal Medical History: Negative for: Gout; Rheumatoid Arthritis; Osteoarthritis; Osteomyelitis Neurologic Medical History: Negative for: Dementia; Neuropathy; Quadriplegia; Paraplegia; Seizure Disorder Oncologic Medical History: Negative for: Received Chemotherapy; Received Radiation Psychiatric Complaints and Symptoms: No Complaints or Symptoms Medical History: Negative for: Anorexia/bulimia; Confinement Anxiety Immunizations Pneumococcal Vaccine: Received Pneumococcal Vaccination: Yes ARYN, KOPS ([id number]) Implantable Devices Family and Social History Cancer: Yes - Mother,Father; Diabetes: Yes - Paternal Grandparents; Heart Disease: No; Hereditary Spherocytosis: No; Hypertension: No; Kidney Disease: No; Lung Disease: Yes - Mother; Seizures: No;  Stroke: Yes - Father; Thyroid Problems: No; Tuberculosis: No; Never smoker; Marital Status - Married; Alcohol Use: Never; Drug Use: No History; Caffeine Use: Moderate; Financial Concerns: No; Food, Clothing or Shelter Needs: No; Support System Lacking: No; Transportation Concerns: No; Advanced Directives: No; Patient does not want information on Advanced Directives Physician Affirmation I have reviewed and agree with the above information. Electronic Signature(s) Signed: 02/12/2018 12:54:11 AM By: Worthy Keeler PA-C Signed: 02/12/2018 3:52:47 PM By: Montey Hora Entered By: Worthy Keeler on 02/12/2018 00:52:36 [place], [name]. (  [id number]) -------------------------------------------------------------------------------- SuperBill Details Patient Name: CAPRI, RABEN. Date of Service: 02/11/2018 Medical Record Number: [id number] Patient Account Number: [id number] Date of Birth/Sex: [birth year] (72 y.o. M) Treating RN: Montey Hora Primary Care Provider: Yaakov Guthrie Other Clinician: Referring Provider: Yaakov Guthrie Treating Provider/Extender: Melburn Hake, Abegail Kloeppel Weeks in Treatment: 8 Diagnosis Coding ICD-10 Codes Code Description T81.31XA Disruption of external operation (surgical) wound, not elsewhere classified, initial encounter E11.621 Type 2 diabetes mellitus with foot ulcer L97.512 Non-pressure chronic ulcer of other part of right foot with fat layer exposed I10 Essential (primary) hypertension Facility Procedures CPT4 Code: 21624469 Description: 99213 - WOUND CARE VISIT-LEV 3 EST PT Modifier: Quantity: 1 Physician Procedures CPT4: Description Modifier Quantity Code 5072257 50518 - WC PHYS LEVEL 3 - EST PT 1 ICD-10 Diagnosis Description T81.31XA Disruption of external operation (surgical) wound, not elsewhere classified, initial encounter E11.621 Type 2 diabetes mellitus with  foot ulcer L97.512 Non-pressure chronic ulcer of other part of right foot with fat layer exposed  I10 Essential (primary) hypertension Electronic Signature(s) Signed: 02/12/2018 12:54:11 AM By: Worthy Keeler PA-C Entered By: Worthy Keeler on 02/12/2018 00:53:49

## 2018-02-18 ENCOUNTER — Ambulatory Visit: Payer: Medicare Other | Admitting: Physician Assistant

## 2018-02-25 ENCOUNTER — Encounter: Payer: Medicare Other | Attending: Physician Assistant | Admitting: Physician Assistant

## 2018-02-25 DIAGNOSIS — L97512 Non-pressure chronic ulcer of other part of right foot with fat layer exposed: Secondary | ICD-10-CM | POA: Diagnosis not present

## 2018-02-25 DIAGNOSIS — I10 Essential (primary) hypertension: Secondary | ICD-10-CM | POA: Diagnosis not present

## 2018-02-25 DIAGNOSIS — L97519 Non-pressure chronic ulcer of other part of right foot with unspecified severity: Secondary | ICD-10-CM | POA: Diagnosis not present

## 2018-02-25 DIAGNOSIS — I2510 Atherosclerotic heart disease of native coronary artery without angina pectoris: Secondary | ICD-10-CM | POA: Insufficient documentation

## 2018-02-25 DIAGNOSIS — Z79899 Other long term (current) drug therapy: Secondary | ICD-10-CM | POA: Diagnosis not present

## 2018-02-25 DIAGNOSIS — E11621 Type 2 diabetes mellitus with foot ulcer: Secondary | ICD-10-CM | POA: Insufficient documentation

## 2018-02-25 DIAGNOSIS — E785 Hyperlipidemia, unspecified: Secondary | ICD-10-CM | POA: Insufficient documentation

## 2018-02-25 DIAGNOSIS — T8189XA Other complications of procedures, not elsewhere classified, initial encounter: Secondary | ICD-10-CM | POA: Diagnosis not present

## 2018-03-02 NOTE — Progress Notes (Signed)
KEANAN, MELANDER ([id number]) Visit Report for 02/25/2018 Arrival Information Details Patient Name: [name]. Date of Service: 02/25/2018 11:15 AM Medical Record Number: [id number] Patient Account Number: [id number] Date of Birth/Sex: Jan 07, 1946 (72 y.o. M) Treating RN: Cornell Barman Primary Care Aidynn Polendo: Yaakov Guthrie Other Clinician: Referring Anedra Penafiel: Yaakov Guthrie Treating Algenis Ballin/Extender: Melburn Hake, HOYT Weeks in Treatment: 10 Visit Information History Since Last Visit Added or deleted any medications: No Patient Arrived: Ambulatory Any new allergies or adverse reactions: No Arrival Time: 11:32 Had a fall or experienced change in No Accompanied By: self activities of daily living that may affect Transfer Assistance: None risk of falls: Patient Identification Verified: Yes Signs or symptoms of abuse/neglect since last visito No Secondary Verification Process Yes Hospitalized since last visit: No Completed: Implantable device outside of the clinic excluding No Patient Has Alerts: Yes cellular tissue based products placed in the center Patient Alerts: DMII since last visit: ABI 12/24/17 L 1.2 R Has Dressing in Place as Prescribed: Yes 1.2 Pain Present Now: No Electronic Signature(s) Signed: 02/26/2018 5:09:03 PM By: Gretta Cool, BSN, RN, CWS, Kim RN, BSN Entered By: Gretta Cool, BSN, RN, CWS, Kim on 02/25/2018 11:32:55 [name] ([id number]) -------------------------------------------------------------------------------- Clinic Level of Care Assessment Details Patient Name: [name] Date of Service: 02/25/2018 11:15 AM Medical Record Number: [id number] Patient Account Number: [id number] Date of Birth/Sex: October 20, 1945 (72 y.o. M) Treating RN: Montey Hora Primary Care Raylinn Kosar: Yaakov Guthrie Other Clinician: Referring Juanice Warburton: Yaakov Guthrie Treating Pamala Hayman/Extender: Melburn Hake, HOYT Weeks in Treatment: 10 Clinic Level of Care Assessment Items TOOL 4  Quantity Score []  - Use when only an EandM is performed on FOLLOW-UP visit 0 ASSESSMENTS - Nursing Assessment / Reassessment X - Reassessment of Co-morbidities (includes updates in patient status) 1 10 X- 1 5 Reassessment of Adherence to Treatment Plan ASSESSMENTS - Wound and Skin Assessment / Reassessment []  - Simple Wound Assessment / Reassessment - one wound 0 []  - 0 Complex Wound Assessment / Reassessment - multiple wounds X- 1 10 Dermatologic / Skin Assessment (not related to wound area) ASSESSMENTS - Focused Assessment []  - Circumferential Edema Measurements - multi extremities 0 []  - 0 Nutritional Assessment / Counseling / Intervention X- 1 5 Lower Extremity Assessment (monofilament, tuning fork, pulses) []  - 0 Peripheral Arterial Disease Assessment (using hand held doppler) ASSESSMENTS - Ostomy and/or Continence Assessment and Care []  - Incontinence Assessment and Management 0 []  - 0 Ostomy Care Assessment and Management (repouching, etc.) PROCESS - Coordination of Care X - Simple Patient / Family Education for ongoing care 1 15 []  - 0 Complex (extensive) Patient / Family Education for ongoing care []  - 0 Staff obtains Programmer, systems, Records, Test Results / Process Orders []  - 0 Staff telephones HHA, Nursing Homes / Clarify orders / etc []  - 0 Routine Transfer to another Facility (non-emergent condition) []  - 0 Routine Hospital Admission (non-emergent condition) []  - 0 New Admissions / Biomedical engineer / Ordering NPWT, Apligraf, etc. []  - 0 Emergency Hospital Admission (emergent condition) X- 1 10 Simple Discharge Coordination [name]. ([id number]) []  - 0 Complex (extensive) Discharge Coordination PROCESS - Special Needs []  - Pediatric / Minor Patient Management 0 []  - 0 Isolation Patient Management []  - 0 Hearing / Language / Visual special needs []  - 0 Assessment of Community assistance (transportation, D/C planning, etc.) []  - 0 Additional  assistance / Altered mentation []  - 0 Support Surface(s) Assessment (bed, cushion, seat, etc.) INTERVENTIONS - Wound Cleansing / Measurement []  - Simple Wound  Cleansing - one wound 0 []  - 0 Complex Wound Cleansing - multiple wounds X- 1 5 Wound Imaging (photographs - any number of wounds) []  - 0 Wound Tracing (instead of photographs) []  - 0 Simple Wound Measurement - one wound []  - 0 Complex Wound Measurement - multiple wounds INTERVENTIONS - Wound Dressings []  - Small Wound Dressing one or multiple wounds 0 []  - 0 Medium Wound Dressing one or multiple wounds []  - 0 Large Wound Dressing one or multiple wounds []  - 0 Application of Medications - topical []  - 0 Application of Medications - injection INTERVENTIONS - Miscellaneous []  - External ear exam 0 []  - 0 Specimen Collection (cultures, biopsies, blood, body fluids, etc.) []  - 0 Specimen(s) / Culture(s) sent or taken to Lab for analysis []  - 0 Patient Transfer (multiple staff / Civil Service fast streamer / Similar devices) []  - 0 Simple Staple / Suture removal (25 or less) []  - 0 Complex Staple / Suture removal (26 or more) []  - 0 Hypo / Hyperglycemic Management (close monitor of Blood Glucose) []  - 0 Ankle / Brachial Index (ABI) - do not check if billed separately X- 1 5 Vital Signs [name], [name] ([id number]) Has the patient been seen at the hospital within the last three years: Yes Total Score: 65 Level Of Care: New/Established - Level 2 Electronic Signature(s) Signed: 02/25/2018 5:28:58 PM By: Montey Hora Entered By: Montey Hora on 02/25/2018 11:39:52 [name] ([id number]) -------------------------------------------------------------------------------- Encounter Discharge Information Details Patient Name: [name] Date of Service: 02/25/2018 11:15 AM Medical Record Number: [id number] Patient Account Number: [id number] Date of Birth/Sex: 03-22-1945 (72 y.o. M) Treating RN: Montey Hora Primary Care  Andersen Iorio: Yaakov Guthrie Other Clinician: Referring Lamija Besse: Yaakov Guthrie Treating Chasiti Waddington/Extender: Melburn Hake, HOYT Weeks in Treatment: 10 Encounter Discharge Information Items Discharge Condition: Stable Ambulatory Status: Ambulatory Discharge Destination: Home Transportation: Private Auto Accompanied By: self Schedule Follow-up Appointment: No Clinical Summary of Care: Electronic Signature(s) Signed: 02/25/2018 5:28:58 PM By: Montey Hora Entered By: Montey Hora on 02/25/2018 12:01:01 [name] ([id number]) -------------------------------------------------------------------------------- Lower Extremity Assessment Details Patient Name: [name] Date of Service: 02/25/2018 11:15 AM Medical Record Number: [id number] Patient Account Number: [id number] Date of Birth/Sex: June 28, 1945 (72 y.o. M) Treating RN: Cornell Barman Primary Care Emberli Ballester: Yaakov Guthrie Other Clinician: Referring Skippy Marhefka: Yaakov Guthrie Treating Jahmal Dunavant/Extender: Melburn Hake, HOYT Weeks in Treatment: 10 Edema Assessment Assessed: [Left: No] [Right: No] Edema: [Left: N] [Right: o] Vascular Assessment Pulses: Dorsalis Pedis Palpable: [Right:Yes] Posterior Tibial Extremity colors, hair growth, and conditions: Extremity Color: [Right:Normal] Temperature of Extremity: [Right:Warm] Capillary Refill: [Right:< 3 seconds] Toe Nail Assessment Left: Right: Thick: No Discolored: No Deformed: No Improper Length and Hygiene: No Electronic Signature(s) Signed: 02/26/2018 5:09:03 PM By: Gretta Cool, BSN, RN, CWS, Kim RN, BSN Entered By: Gretta Cool, BSN, RN, CWS, Kim on 02/25/2018 11:34:45 Klapper, Lonna Barajas ([id number]) -------------------------------------------------------------------------------- [place] Details Patient Name: [name] Date of Service: 02/25/2018 11:15 AM Medical Record Number: [id number] Patient Account Number: [id number] Date of Birth/Sex: 12/04/1945 (72 y.o.  M) Treating RN: Montey Hora Primary Care Tykeria Wawrzyniak: Yaakov Guthrie Other Clinician: Referring Cai Anfinson: Yaakov Guthrie Treating Caroline Longie/Extender: Melburn Hake, HOYT Weeks in Treatment: 10 Active Inactive Electronic Signature(s) Signed: 02/25/2018 5:28:58 PM By: Montey Hora Entered By: Montey Hora on 02/25/2018 11:36:50 Marcum, Lonna Barajas ([id number]) -------------------------------------------------------------------------------- Pain Assessment Details Patient Name: [name] Date of Service: 02/25/2018 11:15 AM Medical Record Number: [id number] Patient Account Number: [id number] Date of Birth/Sex: [birth year] (72 y.o. M) Treating RN:  Cornell Barman Primary Care Isidora Laham: Yaakov Guthrie Other Clinician: Referring Khalel Alms: Yaakov Guthrie Treating Randen Kauth/Extender: Melburn Hake, HOYT Weeks in Treatment: 10 Active Problems Location of Pain Severity and Description of Pain Patient Has Paino No Site Locations Pain Management and Medication Current Pain Management: Electronic Signature(s) Signed: 02/26/2018 5:09:03 PM By: Gretta Cool, BSN, RN, CWS, Kim RN, BSN Entered By: Gretta Cool, BSN, RN, CWS, Kim on 02/25/2018 11:33:01 [name] ([id number]) -------------------------------------------------------------------------------- Patient/Caregiver Education Details Patient Name: [name] Date of Service: 02/25/2018 11:15 AM Medical Record Number: [id number] Patient Account Number: [id number] Date of Birth/Gender: May 05, 1945 (72 y.o. M) Treating RN: Montey Hora Primary Care Physician: Yaakov Guthrie Other Clinician: Referring Physician: Yaakov Guthrie Treating Physician/Extender: Sharalyn Ink in Treatment: 10 Education Assessment Education Provided To: Patient Education Topics Provided Basic Hygiene: Handouts: Other: care of newly healed ulcer site Methods: Explain/Verbal Responses: State content correctly Electronic Signature(s) Signed: 02/25/2018 5:28:58 PM By:  Montey Hora Entered By: Montey Hora on 02/25/2018 11:41:59 [place], Lonna Barajas ([id number]) -------------------------------------------------------------------------------- [place] Details Patient Name: [name] Date of Service: 02/25/2018 11:15 AM Medical Record Number: [id number] Patient Account Number: [id number] Date of Birth/Sex: 1945/12/24 (72 y.o. M) Treating RN: Cornell Barman Primary Care Reakwon Barren: Yaakov Guthrie Other Clinician: Referring Amori Colomb: Yaakov Guthrie Treating Seleny Allbright/Extender: Melburn Hake, HOYT Weeks in Treatment: 10 Vital Signs Time Taken: 11:33 Temperature (F): 98.2 Height (in): 77 Pulse (bpm): 95 Weight (lbs): 240 Respiratory Rate (breaths/min): 16 Body Mass Index (BMI): 28.5 Blood Pressure (mmHg): 124/64 Reference Range: 80 - 120 mg / dl Electronic Signature(s) Signed: 02/26/2018 5:09:03 PM By: Gretta Cool, BSN, RN, CWS, Kim RN, BSN Entered By: Gretta Cool, BSN, RN, CWS, Kim on 02/25/2018 11:33:20

## 2018-03-04 ENCOUNTER — Ambulatory Visit: Payer: Medicare Other | Admitting: Physician Assistant

## 2018-03-04 NOTE — Progress Notes (Signed)
[name], [name] ([id number]) Visit Report for 02/25/2018 Chief Complaint Document Details Patient Name: [name], [name]. Date of Service: 02/25/2018 11:15 AM Medical Record Number: [id number] Patient Account Number: [id number] Date of Birth/Sex: Oct 29, 1945 (72 y.o. M) Treating RN: Montey Hora Primary Care Provider: Yaakov Guthrie Other Clinician: Referring Provider: Yaakov Guthrie Treating Provider/Extender: Melburn Hake, Abbygail [place] Weeks in Treatment: 10 Information Obtained from: Patient Chief Complaint Right foot surgical wound Electronic Signature(s) Signed: 02/28/2018 8:01:46 PM By: Worthy Keeler PA-C Entered By: Worthy Keeler on 02/25/2018 11:14:30 [name], [name] ([id number]) -------------------------------------------------------------------------------- HPI Details Patient Name: [name] Date of Service: 02/25/2018 11:15 AM Medical Record Number: [id number] Patient Account Number: [id number] Date of Birth/Sex: Jun 27, 1945 (72 y.o. M) Treating RN: Montey Hora Primary Care Provider: Yaakov Guthrie Other Clinician: Referring Provider: Yaakov Guthrie Treating Provider/Extender: Melburn Hake, Lariza Cothron Weeks in Treatment: 10 History of Present Illness HPI Description: 12/16/17 on evaluation today patient presents concerning an issue that he had which began roughly 2 years ago which included pain with walking and having had injections previously along with inserts which he obtained from the [facility]. Unfortunately throughout all this he was not getting significantly. Therefore he did present to Dr. 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 Treatment options were discussed included conservative versus surgical. The patient proceed with surgical intervention at that time. Subsequently the patient ended up undergoing surgery apparently on 10/31/17 although I do not see the actual surgical note when I look for  epic. Subsequently he was seen back for reevaluation on 11/06/17 and since that time has been undergoing dressing changes at Dr. Amalia Hailey office. With that being said wound care has been actually performed in their office on a regular basis since then he's been going in getting the dressings changed and rewrap and subsequently he had a significant issue where this became inflamed/infected and he was placed on Cipro 500 mg. He actually is finishing that up today and in fact the foot itself actually appears to be doing well as far as I'm concerned. Nonetheless they have been performing good wound care according to what I'm seeing in the clinic where they were performing all of his dressing changes. Subsequently that has been from October 31, 2017 through current when I evaluated him today. The patient's wound culture which was obtained and completed did show Staphylococcus aureus and E. coli both sensitive to Cipro which is why this was utilize. The patient today states that he really wants to try to get this thing closed as quickly as possible. Patient has a history of diabetes mellitus type II with a hemoglobin A1c on 10/15/17 of 8.4. He also has a history of hypertension which is fairly well controlled. 12/23/17 on evaluation today patient actually appears to be doing better in regard to his foot ulcer. The overall appearance of the wound bed is dramatically improved compared to previous. He does not appear to have had any issues with bleeding after the chemical cauterization last week. Overall I feel like he's making good signs of improvement we have gotten approval for the Grafix PL. 01/07/18 but evaluation today patient actually appears to be doing better in regard to the wound bed at this point which is good news. There still is tendon exposed in the base of the wound but fortunately he seems to be making some signs of progress. This is good and I do believe the wound bed is  ready for the application  of the Grafix PL at this point. He has been tolerating the dressing's at home without complication and he is been doing excellent job taking care of this in my pinion. 01/13/18 on evaluation today patient presents for his second application of Grafix PL. He tolerated the first application without complication. He did end up performing the dressing change although he states when he changed the outer dressing that the mepitel came off with the dressing and the packing strip came out as well. Nonetheless he states the dressing really was not to wet or saturated he mainly just did this out of habit. I believe that this go-round I would like for him to just attempt to leave this in place not removing the packing for any reason other than saturation of the dressing. Other than that I do believe he is appropriate for the second application of Grafix PL. 01/21/18 evaluation today patient's wound actually show signs of excellent granulation. He has been tolerating the Grafix PL without complication at this point. Overall I do feel like that the granulation noted has been quite significant over the period of time that we treated him just for two weeks currently. He's been showing signs of good granulation base of the wound and specifically the tendon which was showing is no longer present all of which is excellent. In general I've been extremely happy with the overall progress. The patient likewise is pleased with how things are progressing. 01/28/18 on evaluation today patient actually appears to be doing wonderful in regard to his ulcer on his foot. He has been tolerating the Grafix PL in an excellent fashion and overall I do feel like he's made significant and rapid progress in general. The wound today appears to be doing much better. 02/04/18 on evaluation today patient actually appears to be doing very well in regard to his foot ulcer. In fact post debridement it actually appears as just a very small area  still open that I did utilize the skin substitute on today. Nonetheless he is having no pain and overall I feel like this is likely within a week or two of complete closure. [name], [name] ([id number]) 02/11/18 on evaluation today patient actually appears to be doing excellent. In fact upon inspection of his wound it appears this is completely healed which is great news. With that being said he is still somewhat reluctant to just want to get in the shower and proceed as normal. You clearly understand this with how things went the last time he thought everything was going well. Nonetheless I think this is a very different scenario I believe this is indeed healed at this point. 02/25/18 on evaluation today patient actually appears to show signs of being completely healed which is excellent news. He has been tolerating the protective bandage without any complication is also be taking showers and states that is had no complication. Overall I'm extremely happy this has remained closed. Electronic Signature(s) Signed: 02/28/2018 8:01:46 PM By: Worthy Keeler PA-C Entered By: Worthy Keeler on 02/28/2018 19:01:10 [name], [name] ([id number]) -------------------------------------------------------------------------------- Physical Exam Details Patient Name: [name], [name] Date of Service: 02/25/2018 11:15 AM Medical Record Number: [id number] Patient Account Number: [id number] Date of Birth/Sex: [birth year] (72 y.o. M) Treating RN: Montey Hora Primary Care Provider: Yaakov Guthrie Other Clinician: Referring Provider: Yaakov Guthrie Treating Provider/Extender: STONE III, Any Mcneice Weeks in Treatment: 10 Constitutional Well-nourished and well-hydrated in no acute distress. Respiratory normal breathing without difficulty. Psychiatric this patient  is able to make decisions and demonstrates good insight into disease process. Alert and Oriented x 3. pleasant and cooperative. Notes Patient's wound  again show signs of complete epithelialization there's no opening and nothing I think for him to be concerned of at this point. Electronic Signature(s) Signed: 02/28/2018 8:01:46 PM By: Worthy Keeler PA-C Entered By: Worthy Keeler on 02/28/2018 19:01:41 [name], [name] ([id number]) -------------------------------------------------------------------------------- Physician Orders Details Patient Name: [name] Date of Service: 02/25/2018 11:15 AM Medical Record Number: [id number] Patient Account Number: [id number] Date of Birth/Sex: 05-Feb-1946 (72 y.o. M) Treating RN: Montey Hora Primary Care Provider: Yaakov Guthrie Other Clinician: Referring Provider: Yaakov Guthrie Treating Provider/Extender: Melburn Hake, Chimene Salo Weeks in Treatment: 10 Verbal / Phone Orders: No Diagnosis Coding ICD-10 Coding Code Description T81.31XA Disruption of external operation (surgical) wound, not elsewhere classified, initial encounter E11.621 Type 2 diabetes mellitus with foot ulcer L97.512 Non-pressure chronic ulcer of other part of right foot with fat layer exposed I10 Essential (primary) hypertension Discharge From [facility] o Discharge from [place] Signature(s) Signed: 02/25/2018 5:28:58 PM By: Montey Hora Signed: 02/28/2018 8:01:46 PM By: Worthy Keeler PA-C Entered By: Montey Hora on 02/25/2018 11:37:01 [name], [name] ([id number]) -------------------------------------------------------------------------------- Problem List Details Patient Name: [name] Date of Service: 02/25/2018 11:15 AM Medical Record Number: [id number] Patient Account Number: [id number] Date of Birth/Sex: [birth year] (72 y.o. M) Treating RN: Montey Hora Primary Care Provider: Yaakov Guthrie Other Clinician: Referring Provider: Yaakov Guthrie Treating Provider/Extender: Melburn Hake, Matricia Begnaud Weeks in Treatment: 10 Active Problems ICD-10 Evaluated Encounter Code Description Active  Date Today Diagnosis T81.31XA Disruption of external operation (surgical) wound, not 12/16/2017 No Yes elsewhere classified, initial encounter E11.621 Type 2 diabetes mellitus with foot ulcer 12/16/2017 No Yes L97.512 Non-pressure chronic ulcer of other part of right foot with fat 12/16/2017 No Yes layer exposed I10 Essential (primary) hypertension 12/16/2017 No Yes Inactive Problems Resolved Problems Electronic Signature(s) Signed: 02/28/2018 8:01:46 PM By: Worthy Keeler PA-C Entered By: Worthy Keeler on 02/25/2018 11:14:18 [name], [name] ([id number]) -------------------------------------------------------------------------------- Progress Note Details Patient Name: [name] Date of Service: 02/25/2018 11:15 AM Medical Record Number: [id number] Patient Account Number: [id number] Date of Birth/Sex: February 18, 1946 (72 y.o. M) Treating RN: Montey Hora Primary Care Provider: Yaakov Guthrie Other Clinician: Referring Provider: Yaakov Guthrie Treating Provider/Extender: Melburn Hake, Clarabel Marion Weeks in Treatment: 10 Subjective Chief Complaint Information obtained from Patient Right foot surgical wound History of Present Illness (HPI) 12/16/17 on evaluation today patient presents concerning an issue that he had which began roughly 2 years ago which included pain with walking and having had injections previously along with inserts which he obtained from the [facility]. Unfortunately throughout all this he was not getting significantly. Therefore he did present to Dr. Daylene Katayama who is a local podiatrist who diagnosed him with a Morton s Neuroma of the second interspace right foot. Treatment options were discussed included conservative versus surgical. The patient proceed with surgical intervention at that time. Subsequently the patient ended up undergoing surgery apparently on 10/31/17 although I do not see the actual surgical note when I look for epic. Subsequently he was seen back for  reevaluation on 11/06/17 and since that time has been undergoing dressing changes at Dr. Amalia Hailey office. With that being said wound care has been actually performed in their office on a regular basis since then he's been going in getting the dressings changed and rewrap and subsequently he had a significant issue where  this became inflamed/infected and he was placed on Cipro 500 mg. He actually is finishing that up today and in fact the foot itself actually appears to be doing well as far as I'm concerned. Nonetheless they have been performing good wound care according to what I'm seeing in the clinic where they were performing all of his dressing changes. Subsequently that has been from October 31, 2017 through current when I evaluated him today. The patient's wound culture which was obtained and completed did show Staphylococcus aureus and E. coli both sensitive to Cipro which is why this was utilize. The patient today states that he really wants to try to get this thing closed as quickly as possible. Patient has a history of diabetes mellitus type II with a hemoglobin A1c on 10/15/17 of 8.4. He also has a history of hypertension which is fairly well controlled. 12/23/17 on evaluation today patient actually appears to be doing better in regard to his foot ulcer. The overall appearance of the wound bed is dramatically improved compared to previous. He does not appear to have had any issues with bleeding after the chemical cauterization last week. Overall I feel like he's making good signs of improvement we have gotten approval for the Grafix PL. 01/07/18 but evaluation today patient actually appears to be doing better in regard to the wound bed at this point which is good news. There still is tendon exposed in the base of the wound but fortunately he seems to be making some signs of progress. This is good and I do believe the wound bed is ready for the application of the Grafix PL at this point. He has  been tolerating the dressing's at home without complication and he is been doing excellent job taking care of this in my pinion. 01/13/18 on evaluation today patient presents for his second application of Grafix PL. He tolerated the first application without complication. He did end up performing the dressing change although he states when he changed the outer dressing that the mepitel came off with the dressing and the packing strip came out as well. Nonetheless he states the dressing really was not to wet or saturated he mainly just did this out of habit. I believe that this go-round I would like for him to just attempt to leave this in place not removing the packing for any reason other than saturation of the dressing. Other than that I do believe he is appropriate for the second application of Grafix PL. 01/21/18 evaluation today patient's wound actually show signs of excellent granulation. He has been tolerating the Grafix PL without complication at this point. Overall I do feel like that the granulation noted has been quite significant over the period of time that we treated him just for two weeks currently. He's been showing signs of good granulation base of the wound and specifically the tendon which was showing is no longer present all of which is excellent. In general I've been extremely happy with the overall progress. The patient likewise is pleased with how things are progressing. 01/28/18 on evaluation today patient actually appears to be doing wonderful in regard to his ulcer on his foot. He has been [name], [name]. ([id number]) tolerating the Grafix PL in an excellent fashion and overall I do feel like he's made significant and rapid progress in general. The wound today appears to be doing much better. 02/04/18 on evaluation today patient actually appears to be doing very well in regard to his foot ulcer. In  fact post debridement it actually appears as just a very small area still  open that I did utilize the skin substitute on today. Nonetheless he is having no pain and overall I feel like this is likely within a week or two of complete closure. 02/11/18 on evaluation today patient actually appears to be doing excellent. In fact upon inspection of his wound it appears this is completely healed which is great news. With that being said he is still somewhat reluctant to just want to get in the shower and proceed as normal. You clearly understand this with how things went the last time he thought everything was going well. Nonetheless I think this is a very different scenario I believe this is indeed healed at this point. 02/25/18 on evaluation today patient actually appears to show signs of being completely healed which is excellent news. He has been tolerating the protective bandage without any complication is also be taking showers and states that is had no complication. Overall I'm extremely happy this has remained closed. Patient History Information obtained from Patient. Family History Cancer - Mother,Father, Diabetes - Paternal Grandparents, Lung Disease - Mother, Stroke - Father, No family history of Heart Disease, Hereditary Spherocytosis, Hypertension, Kidney Disease, Seizures, Thyroid Problems, Tuberculosis. Social History Never smoker, Marital Status - Married, Alcohol Use - Never, Drug Use - No History, Caffeine Use - Moderate. Medical And Surgical History Notes Cardiovascular HLD Review of Systems (ROS) Constitutional Symptoms (General Health) Denies complaints or symptoms of Fever, Chills. Respiratory The patient has no complaints or symptoms. Cardiovascular The patient has no complaints or symptoms. Psychiatric The patient has no complaints or symptoms. Objective Constitutional Well-nourished and well-hydrated in no acute distress. Vitals Time Taken: 11:33 AM, Height: 77 in, Weight: 240 lbs, BMI: 28.5, Temperature: 98.2 F, Pulse: 95 bpm,  Respiratory Rate: 16 breaths/min, Blood Pressure: 124/64 mmHg. [name], [name] ([id number]) Respiratory normal breathing without difficulty. Psychiatric this patient is able to make decisions and demonstrates good insight into disease process. Alert and Oriented x 3. pleasant and cooperative. General Notes: Patient's wound again show signs of complete epithelialization there's no opening and nothing I think for him to be concerned of at this point. Assessment Active Problems ICD-10 Disruption of external operation (surgical) wound, not elsewhere classified, initial encounter Type 2 diabetes mellitus with foot ulcer Non-pressure chronic ulcer of other part of right foot with fat layer exposed Essential (primary) hypertension Plan [facility]charge [facility]: Discharge from [place] recommend full discharge from the wound care center as the patient is completely healed with no signs of anything that would likely worsen. He is very pleased today. I told him that if he has any issues going forward he will contact the office and let me know. Electronic Signature(s) Signed: 02/28/2018 8:01:46 PM By: Worthy Keeler PA-C Entered By: Worthy Keeler on 02/28/2018 19:02:11 [name], [name] ([id number]) -------------------------------------------------------------------------------- ROS/PFSH Details Patient Name: [name] Date of Service: 02/25/2018 11:15 AM Medical Record Number: [id number] Patient Account Number: [id number] Date of Birth/Sex: 05-19-1945 (72 y.o. M) Treating RN: Montey Hora Primary Care Provider: Yaakov Guthrie Other Clinician: Referring Provider: Yaakov Guthrie Treating Provider/Extender: Melburn Hake, Thomasina [name] Weeks in Treatment: 10 Information Obtained From Patient Wound History Do you currently have one or more open woundso Yes How many open wounds do you currently haveo 1 Approximately how long have you had your woundso 6 weeks How  have you been treating your wound(s) until nowo bandage by podiatry  Has your wound(s) ever healed and then re-openedo No Have you had any lab work done in the past montho No Have you tested positive for an antibiotic resistant organism (MRSA, VRE)o No Have you tested positive for osteomyelitis (bone infection)o No Have you had any tests for circulation on your legso No Constitutional Symptoms (General Health) Complaints and Symptoms: Negative for: Fever; Chills Eyes Medical History: Negative for: Cataracts; Glaucoma; Optic Neuritis Ear/Nose/Mouth/Throat Medical History: Negative for: Chronic sinus problems/congestion; Middle ear problems Hematologic/Lymphatic Medical History: Negative for: Anemia; Hemophilia; Human Immunodeficiency Virus; Lymphedema; Sickle Cell Disease Respiratory Complaints and Symptoms: No Complaints or Symptoms Medical History: Negative for: Aspiration; Asthma; Chronic Obstructive Pulmonary Disease (COPD); Pneumothorax; Sleep Apnea; Tuberculosis Cardiovascular Complaints and Symptoms: No Complaints or Symptoms Medical History: Positive for: Coronary Artery Disease; Hypertension Eisenbeis, Brandon G. ([id number]) Negative for: Angina; Arrhythmia; Congestive Heart Failure; Deep Vein Thrombosis; Hypotension; Myocardial Infarction; Peripheral Arterial Disease; Peripheral Venous Disease; Phlebitis; Vasculitis Past Medical History Notes: HLD Gastrointestinal Medical History: Negative for: Cirrhosis ; Colitis; Crohnos; Hepatitis A; Hepatitis B; Hepatitis C Endocrine Medical History: Positive for: Type II Diabetes Negative for: Type I Diabetes Treated with: Oral agents Genitourinary Medical History: Negative for: End Stage Renal Disease Immunological Medical History: Negative for: Lupus Erythematosus; Raynaudos; Scleroderma Integumentary (Skin) Medical History: Negative for: History of Burn; History of pressure wounds Musculoskeletal Medical  History: Negative for: Gout; Rheumatoid Arthritis; Osteoarthritis; Osteomyelitis Neurologic Medical History: Negative for: Dementia; Neuropathy; Quadriplegia; Paraplegia; Seizure Disorder Oncologic Medical History: Negative for: Received Chemotherapy; Received Radiation Psychiatric Complaints and Symptoms: No Complaints or Symptoms Medical History: Negative for: Anorexia/bulimia; Confinement Anxiety Immunizations Pneumococcal Vaccine: Received Pneumococcal Vaccination: Yes MANASSEH, PIT[place]LEY ([id number]) Implantable Devices Family and Social History Cancer: Yes - Mother,Father; Diabetes: Yes - Paternal Grandparents; Heart Disease: No; Hereditary Spherocytosis: No; Hypertension: No; Kidney Disease: No; Lung Disease: Yes - Mother; Seizures: No; Stroke: Yes - Father; Thyroid Problems: No; Tuberculosis: No; Never smoker; Marital Status - Married; Alcohol Use: Never; Drug Use: No History; Caffeine Use: Moderate; Financial Concerns: No; Food, Clothing or Shelter Needs: No; Support System Lacking: No; Transportation Concerns: No; Advanced Directives: No; Patient does not want information on Advanced Directives Physician Affirmation I have reviewed and agree with the above information. Electronic Signature(s) Signed: 02/28/2018 8:01:46 PM By: Worthy Keeler PA-C Signed: 03/03/2018 5:43:16 PM By: Montey Hora Entered By: Worthy Keeler on 02/28/2018 19:01:25 Beichner, [name] ([id number]) -------------------------------------------------------------------------------- SuperBill Details Patient Name: [name] Date of Service: 02/25/2018 Medical Record Number: [id number] Patient Account Number: [id number] Date of Birth/Sex: 1945/08/24 (72 y.o. M) Treating RN: Montey Hora Primary Care Provider: Yaakov Guthrie Other Clinician: Referring Provider: Yaakov Guthrie Treating Provider/Extender: Melburn Hake, Makaila Windle Weeks in Treatment: 10 Diagnosis Coding ICD-10 Codes Code  Description T81.31XA Disruption of external operation (surgical) wound, not elsewhere classified, initial encounter E11.621 Type 2 diabetes mellitus with foot ulcer L97.512 Non-pressure chronic ulcer of other part of right foot with fat layer exposed I10 Essential (primary) hypertension Facility Procedures CPT4 Code: [id number] Description: 19622 - WOUND CARE VISIT-LEV 2 EST PT Modifier: Quantity: 1 Physician Procedures CPT4: Description Modifier Quantity Code [id number] 11941 - WC PHYS LEVEL 2 - EST PT 1 ICD-10 Diagnosis Description T81.31XA Disruption of external operation (surgical) wound, not elsewhere classified, initial encounter E11.621 Type 2 diabetes mellitus with  foot ulcer L97.512 Non-pressure 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

## 2018-04-01 DIAGNOSIS — G5761 Lesion of plantar nerve, right lower limb: Secondary | ICD-10-CM | POA: Diagnosis not present

## 2018-04-01 DIAGNOSIS — M7751 Other enthesopathy of right foot: Secondary | ICD-10-CM | POA: Diagnosis not present

## 2018-04-28 DIAGNOSIS — M71571 Other bursitis, not elsewhere classified, right ankle and foot: Secondary | ICD-10-CM | POA: Diagnosis not present

## 2018-04-28 DIAGNOSIS — M2041 Other hammer toe(s) (acquired), right foot: Secondary | ICD-10-CM | POA: Diagnosis not present

## 2018-05-11 NOTE — Progress Notes (Signed)
Patient is here for follow up visit.  Subjective:   [name], male    DOB: 1945/11/23, 73 y.o.   MRN: [id number]   Chief Complaint  Patient presents with  . Coronary Artery Disease  . Follow-up    HPI  73 year old Caucasian male with hypertension, hyperlipidemia, type II diabetes mellitus, coronary artery disease status post successful RCA PCI 01/2018. moderate nonobstructive LCx disease, here for 6 month follow-up.  He is doing well from cardiac standpoint and denies chest pain, shortness of breath, palpitations, leg edema, orthopnea, PND, TIA/syncope. He has recovered well from his right foot surgery for Morton's neuroma.   Past Medical History:  Diagnosis Date  . Arthritis   . Coronary artery disease   . Diabetes mellitus without complication ([place])    type2  . History of kidney stones   . HLD (hyperlipidemia)   . Hypertension      Past Surgical History:  Procedure Laterality Date  . APPENDECTOMY    . CORONARY ANGIOPLASTY    . CORONARY STENT INTERVENTION N/A 01/28/2017   Procedure: CORONARY STENT INTERVENTION;  Surgeon: Nigel Mormon, MD;  Location: [facility];  Service: Cardiovascular;  Laterality: N/A;  . LEFT HEART CATH AND CORONARY ANGIOGRAPHY N/A 01/28/2017   Procedure: LEFT HEART CATH AND CORONARY ANGIOGRAPHY;  Surgeon: Nigel Mormon, MD;  Location: [facility];  Service: Cardiovascular;  Laterality: N/A;  . TONSILLECTOMY    . ULTRASOUND GUIDANCE FOR VASCULAR ACCESS  01/28/2017   Procedure: Ultrasound Guidance For Vascular Access;  Surgeon: Nigel Mormon, MD;  Location: [facility];  Service: Cardiovascular;;     Social History   Socioeconomic History  . Marital status: Divorced    Spouse name: Not on file  . Number of children: Not on file  . Years of education: Not on file  . Highest education level: Not on file  Occupational History  . Not on file  Social Needs  . Financial resource strain: Not on  file  . Food insecurity:    Worry: Not on file    Inability: Not on file  . Transportation needs:    Medical: Not on file    Non-medical: Not on file  Tobacco Use  . Smoking status: Never Smoker  . Smokeless tobacco: Never Used  Substance and Sexual Activity  . Alcohol use: No    Frequency: Never  . Drug use: No  . Sexual activity: Not on file  Lifestyle  . Physical activity:    Days per week: 5 days    Minutes per session: 30 min  . Stress: Only a little  Relationships  . Social connections:    Talks on phone: Not on file    Gets together: Not on file    Attends religious service: Not on file    Active member of club or organization: Not on file    Attends meetings of clubs or organizations: Not on file    Relationship status: Not on file  . Intimate partner violence:    Fear of current or ex partner: Not on file    Emotionally abused: Not on file    Physically abused: Not on file    Forced sexual activity: Not on file  Other Topics Concern  . Not on file  Social History Narrative  . Not on file     Current Outpatient Medications on File Prior to Visit  Medication Sig Dispense Refill  . amLODipine (NORVASC)  10 MG tablet Take 1 tablet (10 mg total) daily by mouth. 30 tablet 3  . aspirin 81 MG chewable tablet Chew 1 tablet (81 mg total) daily by mouth. 90 tablet 3  . ciprofloxacin (CIPRO) 500 MG tablet Take 1 tablet (500 mg total) by mouth 2 (two) times daily. 20 tablet 0  . clindamycin (CLEOCIN) 150 MG capsule TAKE 1 CAPSULE BY MOUTH EVERY 6 HOURS FOR 10 DAYS  0  . clopidogrel (PLAVIX) 75 MG tablet Take 1 tablet (75 mg total) daily with breakfast by mouth. 60 tablet 3  . finasteride (PROSCAR) 5 MG tablet     . losartan (COZAAR) 25 MG tablet TAKE 1 TABLET BY MOUTH ONCE DAILY    . meloxicam (MOBIC) 15 MG tablet TAKE 1 TABLET BY MOUTH ONCE DAILY    . metFORMIN (GLUCOPHAGE) 1000 MG tablet Take 1,000 mg 2 (two) times daily by mouth.   0  . metoprolol succinate  (TOPROL-XL) 25 MG 24 hr tablet Take 1 tablet (25 mg total) daily by mouth. 30 tablet 3  . metroNIDAZOLE (FLAGYL) 500 MG tablet Take 500 mg by mouth 3 (three) times daily. for 7 days  0  . rosuvastatin (CRESTOR) 20 MG tablet TAKE 1 TABLET BY MOUTH ONCE DAILY    . sulfamethoxazole-trimethoprim (BACTRIM DS,SEPTRA DS) 800-160 MG tablet Take 1 tablet by mouth 2 (two) times daily. 20 tablet 0  . tamsulosin (FLOMAX) 0.4 MG CAPS capsule TAKE 1 CAPSULE BY MOUTH AT BEDTIME     No current facility-administered medications on file prior to visit.     Cardiovascular studies:  EKG 05/12/2018: Sinus  Rhythm  Normal EKG  Cath 01/28/2017: Severe prox-mid RCA and Rt PDA stenoses Moderate LCx disease   Successful percutaneous coronary intervention  Direct stenting Resolute 2.25 x 12 mm PDA PTCA and drug-eluting stent stent placement Resolute 4.0 x 30 mm proximal RCA Medical management for moderate LCx disease.    Echocardiogram 12/25/2016: Left ventricle cavity is normal in size. Moderate concentric hypertrophy of the left ventricle. Normal global wall motion. Doppler evidence of grade I (impaired) diastolic dysfunction, normal LAP. Calculated EF 55%. No hemodynamically significant valvular abnormality. Normal right atrial pressure  Review of Systems  Constitution: Negative for decreased appetite, malaise/fatigue, weight gain and weight loss.  HENT: Negative for congestion.   Eyes: Negative for visual disturbance.  Cardiovascular: Negative for chest pain, claudication, dyspnea on exertion, leg swelling, palpitations and syncope.  Respiratory: Negative for shortness of breath.   Endocrine: Negative for cold intolerance.  Hematologic/Lymphatic: Does not bruise/bleed easily.  Skin: Negative for itching and rash.  Musculoskeletal: Negative for myalgias.  Gastrointestinal: Negative for abdominal pain, nausea and vomiting.  Genitourinary: Negative for dysuria.  Neurological: Negative for dizziness  and weakness.  Psychiatric/Behavioral: The patient is not nervous/anxious.   All other systems reviewed and are negative.      Objective:   Vitals:   05/12/18 1051  BP: 138/88  Pulse: 69  SpO2: 98%     Physical Exam  Constitutional: He is oriented to person, place, and time. He appears well-developed and well-nourished. No distress.  HENT:  Head: Normocephalic and atraumatic.  Eyes: Pupils are equal, round, and reactive to light. Conjunctivae are normal.  Neck: No JVD present.  Cardiovascular: Normal rate, regular rhythm and intact distal pulses.  No murmur heard. Pulmonary/Chest: Effort normal and breath sounds normal. He has no wheezes. He has no rales.  Abdominal: Soft. Bowel sounds are normal. There is no rebound.  Musculoskeletal:  General: No edema.  Lymphadenopathy:    He has no cervical adenopathy.  Neurological: He is alert and oriented to person, place, and time. No cranial nerve deficit.  Skin: Skin is warm and dry.  Psychiatric: He has a normal mood and affect.  Nursing note and vitals reviewed.       Assessment & Recommendations:   73 year old Caucasian male with hypertension, hyperlipidemia, type II diabetes mellitus, coronary artery disease status post successful RCA PCI 01/2018. moderate nonobstructive LCx disease, here for 6 month follow-up.  CAD: Stable. I have asked him to reduce ASA to 81 mg to avoid bleeding risk.  Hypertension: Controlled on losartan  Type 2 DM: On metformin. Patient is not willing to consider Jardiance at this time.  Hyperlipiemia: Continue rosuvastatin 20 mg daily.  I will see him back in 1 year.  Nigel Mormon, MD [facility]. [place] Pager: [phone] Office: [phone] If no answer Cell [phone]

## 2018-05-12 ENCOUNTER — Encounter: Payer: Self-pay | Admitting: Cardiology

## 2018-05-12 ENCOUNTER — Ambulatory Visit (INDEPENDENT_AMBULATORY_CARE_PROVIDER_SITE_OTHER): Payer: Medicare Other | Admitting: Cardiology

## 2018-05-12 ENCOUNTER — Other Ambulatory Visit: Payer: Self-pay | Admitting: Cardiology

## 2018-05-12 DIAGNOSIS — Z794 Long term (current) use of insulin: Secondary | ICD-10-CM | POA: Diagnosis not present

## 2018-05-12 DIAGNOSIS — E119 Type 2 diabetes mellitus without complications: Secondary | ICD-10-CM

## 2018-05-12 DIAGNOSIS — I10 Essential (primary) hypertension: Secondary | ICD-10-CM

## 2018-05-12 DIAGNOSIS — I25118 Atherosclerotic heart disease of native coronary artery with other forms of angina pectoris: Secondary | ICD-10-CM | POA: Insufficient documentation

## 2018-05-12 DIAGNOSIS — I2510 Atherosclerotic heart disease of native coronary artery without angina pectoris: Secondary | ICD-10-CM | POA: Diagnosis not present

## 2018-05-12 DIAGNOSIS — R6889 Other general symptoms and signs: Secondary | ICD-10-CM | POA: Diagnosis not present

## 2018-05-12 DIAGNOSIS — I2089 Other forms of angina pectoris: Secondary | ICD-10-CM | POA: Insufficient documentation

## 2018-05-12 DIAGNOSIS — Z0189 Encounter for other specified special examinations: Secondary | ICD-10-CM | POA: Insufficient documentation

## 2018-05-12 DIAGNOSIS — I208 Other forms of angina pectoris: Secondary | ICD-10-CM

## 2018-05-12 HISTORY — DX: Atherosclerotic heart disease of native coronary artery without angina pectoris: I25.10

## 2018-05-12 HISTORY — DX: Other forms of angina pectoris: I20.8

## 2018-05-12 HISTORY — DX: Essential (primary) hypertension: I10

## 2018-05-12 HISTORY — DX: Long term (current) use of insulin: E11.9

## 2018-05-13 ENCOUNTER — Other Ambulatory Visit: Payer: Self-pay

## 2018-05-13 MED ORDER — ROSUVASTATIN CALCIUM 20 MG PO TABS: 20.0000 mg | ORAL_TABLET | Freq: Every day | ORAL | 1 refills | Status: DC

## 2018-05-15 DIAGNOSIS — Z125 Encounter for screening for malignant neoplasm of prostate: Secondary | ICD-10-CM | POA: Diagnosis not present

## 2018-05-15 DIAGNOSIS — R809 Proteinuria, unspecified: Secondary | ICD-10-CM | POA: Diagnosis not present

## 2018-05-15 DIAGNOSIS — E119 Type 2 diabetes mellitus without complications: Secondary | ICD-10-CM | POA: Diagnosis not present

## 2018-05-15 DIAGNOSIS — D509 Iron deficiency anemia, unspecified: Secondary | ICD-10-CM | POA: Diagnosis not present

## 2018-05-15 DIAGNOSIS — R972 Elevated prostate specific antigen [PSA]: Secondary | ICD-10-CM | POA: Diagnosis not present

## 2018-05-15 DIAGNOSIS — Z1159 Encounter for screening for other viral diseases: Secondary | ICD-10-CM | POA: Diagnosis not present

## 2018-05-15 DIAGNOSIS — E782 Mixed hyperlipidemia: Secondary | ICD-10-CM | POA: Diagnosis not present

## 2018-05-15 DIAGNOSIS — I10 Essential (primary) hypertension: Secondary | ICD-10-CM | POA: Diagnosis not present

## 2018-05-27 DIAGNOSIS — G5761 Lesion of plantar nerve, right lower limb: Secondary | ICD-10-CM | POA: Diagnosis not present

## 2018-06-16 DIAGNOSIS — Z1211 Encounter for screening for malignant neoplasm of colon: Secondary | ICD-10-CM | POA: Diagnosis not present

## 2018-06-16 DIAGNOSIS — I10 Essential (primary) hypertension: Secondary | ICD-10-CM | POA: Diagnosis not present

## 2018-06-16 DIAGNOSIS — Z86010 Personal history of colonic polyps: Secondary | ICD-10-CM | POA: Diagnosis not present

## 2018-06-16 DIAGNOSIS — E119 Type 2 diabetes mellitus without complications: Secondary | ICD-10-CM | POA: Diagnosis not present

## 2018-06-16 DIAGNOSIS — R809 Proteinuria, unspecified: Secondary | ICD-10-CM | POA: Diagnosis not present

## 2018-06-16 DIAGNOSIS — E782 Mixed hyperlipidemia: Secondary | ICD-10-CM | POA: Diagnosis not present

## 2018-06-16 DIAGNOSIS — Z0000 Encounter for general adult medical examination without abnormal findings: Secondary | ICD-10-CM | POA: Diagnosis not present

## 2018-06-20 DIAGNOSIS — R81 Glycosuria: Secondary | ICD-10-CM | POA: Diagnosis not present

## 2018-06-20 DIAGNOSIS — R300 Dysuria: Secondary | ICD-10-CM | POA: Diagnosis not present

## 2018-06-20 DIAGNOSIS — E119 Type 2 diabetes mellitus without complications: Secondary | ICD-10-CM | POA: Diagnosis not present

## 2018-06-20 DIAGNOSIS — I10 Essential (primary) hypertension: Secondary | ICD-10-CM | POA: Diagnosis not present

## 2018-06-20 DIAGNOSIS — R358 Other polyuria: Secondary | ICD-10-CM | POA: Diagnosis not present

## 2018-08-15 DIAGNOSIS — R3912 Poor urinary stream: Secondary | ICD-10-CM | POA: Diagnosis not present

## 2018-08-15 DIAGNOSIS — N401 Enlarged prostate with lower urinary tract symptoms: Secondary | ICD-10-CM | POA: Diagnosis not present

## 2018-10-29 ENCOUNTER — Other Ambulatory Visit: Payer: Self-pay | Admitting: Cardiology

## 2018-11-17 DIAGNOSIS — N401 Enlarged prostate with lower urinary tract symptoms: Secondary | ICD-10-CM | POA: Diagnosis not present

## 2018-11-17 DIAGNOSIS — R972 Elevated prostate specific antigen [PSA]: Secondary | ICD-10-CM | POA: Diagnosis not present

## 2018-11-17 DIAGNOSIS — R3912 Poor urinary stream: Secondary | ICD-10-CM | POA: Diagnosis not present

## 2018-12-23 DIAGNOSIS — Z20828 Contact with and (suspected) exposure to other viral communicable diseases: Secondary | ICD-10-CM | POA: Diagnosis not present

## 2019-01-06 DIAGNOSIS — D225 Melanocytic nevi of trunk: Secondary | ICD-10-CM | POA: Diagnosis not present

## 2019-01-06 DIAGNOSIS — D1730 Benign lipomatous neoplasm of skin and subcutaneous tissue of unspecified sites: Secondary | ICD-10-CM | POA: Diagnosis not present

## 2019-01-06 DIAGNOSIS — L821 Other seborrheic keratosis: Secondary | ICD-10-CM | POA: Diagnosis not present

## 2019-01-06 DIAGNOSIS — Z23 Encounter for immunization: Secondary | ICD-10-CM | POA: Diagnosis not present

## 2019-01-06 DIAGNOSIS — L570 Actinic keratosis: Secondary | ICD-10-CM | POA: Diagnosis not present

## 2019-01-08 DIAGNOSIS — Z1159 Encounter for screening for other viral diseases: Secondary | ICD-10-CM | POA: Diagnosis not present

## 2019-01-12 DIAGNOSIS — K5730 Diverticulosis of large intestine without perforation or abscess without bleeding: Secondary | ICD-10-CM | POA: Diagnosis not present

## 2019-01-12 DIAGNOSIS — Z86010 Personal history of colonic polyps: Secondary | ICD-10-CM | POA: Diagnosis not present

## 2019-01-12 DIAGNOSIS — K635 Polyp of colon: Secondary | ICD-10-CM | POA: Diagnosis not present

## 2019-01-14 DIAGNOSIS — K635 Polyp of colon: Secondary | ICD-10-CM | POA: Diagnosis not present

## 2019-01-20 ENCOUNTER — Other Ambulatory Visit: Payer: Self-pay | Admitting: Cardiology

## 2019-01-25 DIAGNOSIS — I10 Essential (primary) hypertension: Secondary | ICD-10-CM | POA: Diagnosis not present

## 2019-01-25 DIAGNOSIS — N401 Enlarged prostate with lower urinary tract symptoms: Secondary | ICD-10-CM | POA: Diagnosis not present

## 2019-01-25 DIAGNOSIS — E785 Hyperlipidemia, unspecified: Secondary | ICD-10-CM | POA: Diagnosis not present

## 2019-01-25 DIAGNOSIS — E1121 Type 2 diabetes mellitus with diabetic nephropathy: Secondary | ICD-10-CM | POA: Diagnosis not present

## 2019-01-25 DIAGNOSIS — E118 Type 2 diabetes mellitus with unspecified complications: Secondary | ICD-10-CM | POA: Diagnosis not present

## 2019-01-25 DIAGNOSIS — E119 Type 2 diabetes mellitus without complications: Secondary | ICD-10-CM | POA: Diagnosis not present

## 2019-01-25 DIAGNOSIS — D509 Iron deficiency anemia, unspecified: Secondary | ICD-10-CM | POA: Diagnosis not present

## 2019-01-25 DIAGNOSIS — E782 Mixed hyperlipidemia: Secondary | ICD-10-CM | POA: Diagnosis not present

## 2019-03-24 DIAGNOSIS — I10 Essential (primary) hypertension: Secondary | ICD-10-CM | POA: Diagnosis not present

## 2019-03-24 DIAGNOSIS — E782 Mixed hyperlipidemia: Secondary | ICD-10-CM | POA: Diagnosis not present

## 2019-03-24 DIAGNOSIS — D509 Iron deficiency anemia, unspecified: Secondary | ICD-10-CM | POA: Diagnosis not present

## 2019-03-24 DIAGNOSIS — E118 Type 2 diabetes mellitus with unspecified complications: Secondary | ICD-10-CM | POA: Diagnosis not present

## 2019-03-24 DIAGNOSIS — E1121 Type 2 diabetes mellitus with diabetic nephropathy: Secondary | ICD-10-CM | POA: Diagnosis not present

## 2019-03-24 DIAGNOSIS — N401 Enlarged prostate with lower urinary tract symptoms: Secondary | ICD-10-CM | POA: Diagnosis not present

## 2019-03-24 DIAGNOSIS — E119 Type 2 diabetes mellitus without complications: Secondary | ICD-10-CM | POA: Diagnosis not present

## 2019-03-24 DIAGNOSIS — E785 Hyperlipidemia, unspecified: Secondary | ICD-10-CM | POA: Diagnosis not present

## 2019-04-14 DIAGNOSIS — H524 Presbyopia: Secondary | ICD-10-CM | POA: Diagnosis not present

## 2019-04-14 DIAGNOSIS — E119 Type 2 diabetes mellitus without complications: Secondary | ICD-10-CM | POA: Diagnosis not present

## 2019-04-14 DIAGNOSIS — Z961 Presence of intraocular lens: Secondary | ICD-10-CM | POA: Diagnosis not present

## 2019-04-15 ENCOUNTER — Other Ambulatory Visit: Payer: Self-pay | Admitting: Cardiology

## 2019-04-28 ENCOUNTER — Encounter (HOSPITAL_COMMUNITY): Payer: Self-pay | Admitting: Emergency Medicine

## 2019-04-28 ENCOUNTER — Other Ambulatory Visit: Payer: Self-pay

## 2019-04-28 ENCOUNTER — Emergency Department (HOSPITAL_COMMUNITY)
Admission: EM | Admit: 2019-04-28 | Discharge: 2019-04-28 | Discharge status: Against Medical Advice | Payer: Medicare Other | Attending: Emergency Medicine | Admitting: Emergency Medicine

## 2019-04-28 DIAGNOSIS — Z955 Presence of coronary angioplasty implant and graft: Secondary | ICD-10-CM | POA: Insufficient documentation

## 2019-04-28 DIAGNOSIS — I10 Essential (primary) hypertension: Secondary | ICD-10-CM | POA: Insufficient documentation

## 2019-04-28 DIAGNOSIS — H10411 Chronic giant papillary conjunctivitis, right eye: Secondary | ICD-10-CM | POA: Diagnosis not present

## 2019-04-28 DIAGNOSIS — Z5320 Procedure and treatment not carried out because of patient's decision for unspecified reasons: Secondary | ICD-10-CM | POA: Diagnosis not present

## 2019-04-28 DIAGNOSIS — Z7982 Long term (current) use of aspirin: Secondary | ICD-10-CM | POA: Insufficient documentation

## 2019-04-28 DIAGNOSIS — R55 Syncope and collapse: Secondary | ICD-10-CM | POA: Diagnosis not present

## 2019-04-28 DIAGNOSIS — R42 Dizziness and giddiness: Secondary | ICD-10-CM | POA: Diagnosis not present

## 2019-04-28 DIAGNOSIS — Z79899 Other long term (current) drug therapy: Secondary | ICD-10-CM | POA: Diagnosis not present

## 2019-04-28 DIAGNOSIS — Z7901 Long term (current) use of anticoagulants: Secondary | ICD-10-CM | POA: Diagnosis not present

## 2019-04-28 DIAGNOSIS — I2510 Atherosclerotic heart disease of native coronary artery without angina pectoris: Secondary | ICD-10-CM | POA: Diagnosis not present

## 2019-04-28 DIAGNOSIS — E119 Type 2 diabetes mellitus without complications: Secondary | ICD-10-CM | POA: Diagnosis not present

## 2019-04-28 DIAGNOSIS — Z7984 Long term (current) use of oral hypoglycemic drugs: Secondary | ICD-10-CM | POA: Insufficient documentation

## 2019-04-28 LAB — CBG MONITORING, ED: Glucose-Capillary: 179 mg/dL — ABNORMAL HIGH (ref 70–99)

## 2019-04-28 LAB — CBC
HCT: 40.7 % (ref 39.0–52.0)
Hemoglobin: 13.8 g/dL (ref 13.0–17.0)
MCH: 30.4 pg (ref 26.0–34.0)
MCHC: 33.9 g/dL (ref 30.0–36.0)
MCV: 89.6 fL (ref 80.0–100.0)
Platelets: 147 10*3/uL — ABNORMAL LOW (ref 150–400)
RBC: 4.54 MIL/uL (ref 4.22–5.81)
RDW: 13.2 % (ref 11.5–15.5)
WBC: 6.2 10*3/uL (ref 4.0–10.5)
nRBC: 0 % (ref 0.0–0.2)

## 2019-04-28 LAB — BASIC METABOLIC PANEL
Anion gap: 11 (ref 5–15)
BUN: 19 mg/dL (ref 8–23)
CO2: 23 mmol/L (ref 22–32)
Calcium: 9.3 mg/dL (ref 8.9–10.3)
Chloride: 104 mmol/L (ref 98–111)
Creatinine, Ser: 1.1 mg/dL (ref 0.61–1.24)
GFR calc Af Amer: >60 mL/min (ref >60)
GFR calc non Af Amer: >60 mL/min (ref >60)
Glucose, Bld: 215 mg/dL — ABNORMAL HIGH (ref 70–99)
Potassium: 3.9 mmol/L (ref 3.5–5.1)
Sodium: 138 mmol/L (ref 135–145)

## 2019-04-28 MED ORDER — SODIUM CHLORIDE 0.9% FLUSH: 3.0000 mL | Freq: Once | INTRAVENOUS | Status: DC

## 2019-04-28 MED ORDER — ONDANSETRON 4 MG PO TBDP
4.0000 mg | ORAL_TABLET | Freq: Once | ORAL | Status: AC
  Administered 2019-04-28: 4 mg via ORAL
  Filled 2019-04-28: qty 1

## 2019-04-28 NOTE — Discharge Instructions (Signed)
You have chosen to leave [facility]. Should you change your mind, you are always welcome and encouraged to return to the ED. You are encouraged to follow-up with, at the very least, a primary care provider, or other similar medical professional on this matter.   Please follow-up with your cardiologist.  Please call today to make that appointment.

## 2019-04-28 NOTE — ED Triage Notes (Addendum)
Pt states he went to the eye doctor for something in his right eye- they dilated his eye. Afterwards he felt dizzy and passed out/vomited at the office. Pt was seated in a chair when he passed out. Pt continues to feel nauseous and light headed.

## 2019-04-28 NOTE — ED Provider Notes (Addendum)
[facility] EMERGENCY DEPARTMENT Provider Note   CSN: [id number] Arrival date & time: 04/28/19  1147     History Chief Complaint  Patient presents with  . Loss of Consciousness    [name] is a 74 y.o. male.  HPI  Patient is a 74 year old male with CAD, HTN, DM, HLD.  More specifically for patient CAD he states he had abnormal stress test several years ago and had 2 stents placed to prevent stenosis.  He is seen by [facility].  Patient was as ophthalmologist today for right eye redness diagnosed with subconjunctival hemorrhage with no foreign body.  Patient states that he was doing well and had just finished slit-lamp exam when he felt lightheaded, tingling in his arms and states that he laid back in his patio chair and passed out been explained.  Patient states this occurred at approximately 10 AM.  States he woke up when the ophthalmology staff used smelling salts.  He states he has had persistent dizziness but been improving since that time.  Patient states that he did urinate while he was passed out.  Patient denies any chest pain, shortness breath, vomiting, abdominal pain, vertiginous symptoms, headache or confusion.  He has no history of seizures and states that the staff told him he had no seizure-like activity.  He denies any tongue or cheek pain.  Denies any confusion when he woke up stating that he knew what was going on around him.   Denies any HA or disorientation.      Past Medical History:  Diagnosis Date  . Arthritis   . Controlled type 2 diabetes mellitus without complication, with long-term current use of insulin ([place]) 05/12/2018  . Coronary artery disease   . Coronary atherosclerosis of native coronary artery 05/12/2018  . Diabetes mellitus without complication ([place])    type2  . Equivalent angina ([place]) 05/12/2018  . Essential hypertension, benign 05/12/2018  . History of kidney stones   . HLD (hyperlipidemia)   . Hypertension      Patient Active Problem List   Diagnosis Date Noted  . Essential hypertension, benign 05/12/2018  . Controlled type 2 diabetes mellitus without complication, with long-term current use of insulin ([place]) 05/12/2018  . Coronary atherosclerosis of native coronary artery 05/12/2018  . Laboratory examination 05/12/2018  . Equivalent angina ([place]) 05/12/2018  . Decreased exercise tolerance 05/12/2018  . Nasal septal perforation 11/13/2017  . Sinus pain 11/13/2017  . Post PTCA 01/28/2017  . Abnormal stress test 01/23/2017    Past Surgical History:  Procedure Laterality Date  . APPENDECTOMY    . CORONARY ANGIOPLASTY    . CORONARY STENT INTERVENTION N/A 01/28/2017   Procedure: CORONARY STENT INTERVENTION;  Surgeon: Nigel Mormon, MD;  Location: [facility];  Service: Cardiovascular;  Laterality: N/A;  . LEFT HEART CATH AND CORONARY ANGIOGRAPHY N/A 01/28/2017   Procedure: LEFT HEART CATH AND CORONARY ANGIOGRAPHY;  Surgeon: Nigel Mormon, MD;  Location: [facility];  Service: Cardiovascular;  Laterality: N/A;  . TONSILLECTOMY    . ULTRASOUND GUIDANCE FOR VASCULAR ACCESS  01/28/2017   Procedure: Ultrasound Guidance For Vascular Access;  Surgeon: Nigel Mormon, MD;  Location: [facility];  Service: Cardiovascular;;       Family History  Problem Relation Age of Onset  . Bladder Cancer Mother   . Cancer Father     Social History   Tobacco Use  . Smoking status: Never Smoker  . Smokeless tobacco:  Never Used  Substance Use Topics  . Alcohol use: Yes    Comment: 4-5 drinks a year   . Drug use: No    Home Medications Prior to Admission medications   Medication Sig Start Date End Date Taking? Authorizing Provider  aspirin 81 MG chewable tablet Chew 1 tablet (81 mg total) daily by mouth. 01/29/17  Yes Patwardhan, Manish J, MD  finasteride (PROSCAR) 5 MG tablet Take 5 mg by mouth at bedtime.  10/16/17  Yes [provider]  losartan  (COZAAR) 25 MG tablet Take 1 tablet by mouth once daily Patient taking differently: Take 25 mg by mouth daily.  04/15/19  Yes Patwardhan, Reynold Bowen, MD  metFORMIN (GLUCOPHAGE) 1000 MG tablet Take 1,000 mg 2 (two) times daily by mouth.  08/27/16  Yes [provider]  rosuvastatin (CRESTOR) 20 MG tablet Take 1 tablet by mouth once daily Patient taking differently: Take 20 mg by mouth daily.  04/15/19  Yes Patwardhan, Manish J, MD  tamsulosin (FLOMAX) 0.4 MG CAPS capsule Take 0.4 mg by mouth at bedtime.  10/02/17  Yes [provider]  amLODipine (NORVASC) 10 MG tablet Take 1 tablet (10 mg total) daily by mouth. Patient not taking: Reported on 05/12/2018 01/29/17   Nigel Mormon, MD  clopidogrel (PLAVIX) 75 MG tablet Take 1 tablet (75 mg total) daily with breakfast by mouth. Patient not taking: Reported on 05/12/2018 01/29/17   Nigel Mormon, MD  metoprolol succinate (TOPROL-XL) 25 MG 24 hr tablet Take 1 tablet (25 mg total) daily by mouth. Patient not taking: Reported on 05/12/2018 01/28/17   Nigel Mormon, MD    Allergies    Patient has no known allergies.  Review of Systems   Review of Systems  Constitutional: Negative for chills and fever.  HENT: Negative for congestion.   Eyes: Negative for pain.  Respiratory: Negative for cough and shortness of breath.   Cardiovascular: Negative for chest pain and leg swelling.  Gastrointestinal: Positive for nausea. Negative for abdominal pain and vomiting.  Genitourinary: Negative for dysuria.  Musculoskeletal: Negative for myalgias.  Skin: Negative for rash.  Neurological: Positive for syncope. Negative for dizziness, seizures, numbness and headaches.    Physical Exam Updated Vital Signs BP (!) 184/84   Pulse (!) 56   Temp 97.8 F (36.6 C) (Oral)   Resp 12   SpO2 98%   Physical Exam Vitals and nursing note reviewed.  Constitutional:      General: He is not in acute distress.    Appearance: He is not  ill-appearing.     Comments: 74 year old male, pleasant, well-appearing, nondiaphoretic or toxic/ill-appearing  HENT:     Head: Normocephalic and atraumatic.     Nose: Nose normal.     Mouth/Throat:     Mouth: Mucous membranes are moist.  Eyes:     General: No scleral icterus.    Extraocular Movements: Extraocular movements intact.     Comments: Left pupil chronically dilated (old injury) Right eye with small subconjunctival hemorrhage   Cardiovascular:     Rate and Rhythm: Normal rate and regular rhythm.     Pulses: Normal pulses.     Heart sounds: Normal heart sounds.     Comments: Heart rate is regular, no murmurs, rubs, gallops.  Bilateral radial pulses are 3+ and symmetric.  Bilateral DP pulses are 3+ symmetric. Pulmonary:     Effort: Pulmonary effort is normal. No respiratory distress.     Breath sounds: No wheezing.  Abdominal:  General: Bowel sounds are normal.     Palpations: Abdomen is soft. There is no mass.     Tenderness: There is no abdominal tenderness. There is no right CVA tenderness, left CVA tenderness, guarding or rebound.     Hernia: No hernia is present.  Musculoskeletal:     Cervical back: Normal range of motion.     Right lower leg: No edema.     Left lower leg: No edema.     Comments: No lower extremity edema or calf tenderness.  Skin:    General: Skin is warm and dry.     Capillary Refill: Capillary refill takes less than 2 seconds.  Neurological:     Mental Status: He is alert. Mental status is at baseline.     Comments: Alert and oriented to self, place, time and event.  Speech is fluent, clear without dysarthria or dysphasia.  Strength 5/5 in upper/lower extremities  Sensation intact in upper/lower extremities   Normal gait--ambulated across room twice with good coordination without difficulty. Negative Romberg. No pronator drift.  Normal finger-to-nose and feet tapping.  CN I not tested  CN II grossly intact visual fields bilaterally. Did  not visualize posterior eye.  CN III, IV, VI PERRLA and EOMs intact bilaterally  CN V Intact sensation to sharp and light touch to the face  CN VII facial movements symmetric  CN VIII not tested  CN IX, X no uvula deviation, symmetric rise of soft palate  CN XI 5/5 SCM and trapezius strength bilaterally  CN XII Midline tongue protrusion, symmetric L/R movements   Psychiatric:        Mood and Affect: Mood normal.        Behavior: Behavior normal.     ED Results / Procedures / Treatments   Labs (all labs ordered are listed, but only abnormal results are displayed) Labs Reviewed  BASIC METABOLIC PANEL - Abnormal; Notable for the following components:      Result Value   Glucose, Bld 215 (*)    All other components within normal limits  CBC - Abnormal; Notable for the following components:   Platelets 147 (*)    All other components within normal limits  CBG MONITORING, ED - Abnormal; Notable for the following components:   Glucose-Capillary 179 (*)    All other components within normal limits    EKG EKG Interpretation  Date/Time:  Tuesday April 28 2019 11:53:56 EST Ventricular Rate:  66 PR Interval:  170 QRS Duration: 96 QT Interval:  408 QTC Calculation: 427 R Axis:   36 Text Interpretation: Normal sinus rhythm Nonspecific ST abnormality Abnormal ECG Confirmed by Pattricia Boss [phone]) on 04/28/2019 3:29:49 PM   Radiology No results found.  Procedures Procedures (including critical care time)  Medications Ordered in ED Medications  sodium chloride flush (NS) 0.9 % injection 3 mL (0 mLs Intravenous Hold 04/28/19 1310)  ondansetron (ZOFRAN-ODT) disintegrating tablet 4 mg (4 mg Oral Given 04/28/19 1314)    ED Course  I have reviewed the triage vital signs and the nursing notes.  Pertinent labs & imaging results that were available during my care of the patient were reviewed by me and considered in my medical decision making (see chart for details).    MDM  Rules/Calculators/A&P                      Patient is a 74 year old male presented today for syncopal episode he has a history of CAD, hypertension and  diabetes and hyperlipidemia physical exam is benign and over the course of his ED visit he improved with feeling quite nauseous and somewhat lightheaded to having no nausea and being lightheaded.  He has no abnormalities on his neurologic exam, cardiac exam and pulmonary exam.  He is well-appearing is requesting to go home.  Labs, EKG, vitals reviewed and self.  CBC is unremarkable.  He has mildly elevated blood sugar at 215 unlikely to be causing his symptoms today.  Patient was given p.o. fluid after ODT Zofran and tolerated fluid well without any emesis and with sensation of nausea.  EKG is nonischemic but does have TWI in lead III.  He denies any chest pain shortness of breath, diaphoresis.  Patient's vitals are significant for elevated blood pressure which he has a history of.  I discussed patient's results with him and my attending physician.  We recommended admission for syncope.  Patient is AO x4 and mentating well, speaking clearly and not intoxicated.  He is requesting to be discharged home.  Because patient is declining hospitalization he will sign out AMA. I discussed this case with my attending physician who cosigned this note including patient's presenting symptoms, physical exam, and planned diagnostics and interventions. Attending physician stated agreement with plan or made changes to plan which were implemented. Attending physician assessed patient at bedside.  ---  Patient wishes to leave [place]. I personally explained need for further testing and my concerns for adverse outcomes if workup is incomplete. Specific concerns explained to patient include worsening symptoms, functional loss, long term sickness and death. Patient states understanding of risks and states they will return if they feel the need to at a later date to  receive the recommended care or any other care at any time, regardless of their ability to pay for such care. Patient understands they are able to return at any time. Patient is able to explain back the risks of leaving AMA and still wishes to leave.   Specifically I recommend admission to telemetry floor assess medical cardiology to evaluate for and exclude cardiac etiology of syncopal episode  The patient is oriented to person, place, and time, has the capacity to make decisions regarding the medical care offered. The patient speaks coherently and exhibits no evidence of having an altered level of consciousness or alcohol or drug intoxication to a point that would impair judgment. They respond knowingly to questions about recommended treatment and alternate treatments including no further testing or treatment; participate in diagnostic and treatment decisions by means of rational thought processes; and understand the items of minimum basic medical treatment information with respect to that treatment (the nature and seriousness of the illness, the nature of the treatment, the probable degree and duration of any benefits and risks of any medical intervention that is being recommended, and the consequences of lack of treatment, and the nature, risks, and benefits of any reasonable alternatives).  The patient understands the relevant information of the nature of their medical condition, as well as the risks, benefits, and treatment alternatives (including non-treatment), consequences of refusing care, and can competently communicate a rational explanation about their choice of care options.    Included in AVS was the following message:  You have chosen to leave [facility]. Should you change your mind, you are always welcome and encouraged to return to the ED. You are encouraged to follow-up with, at the very least, a primary care provider, or other similar medical professional on this  matter.   Final Clinical Impression(s) / ED Diagnoses Final diagnoses:  Syncope, unspecified syncope type    Rx / DC Orders ED Discharge Orders    None       Tedd Sias, [place] 04/28/19 2027    Pati Gallo [place], [place] 04/28/19 2206    Pattricia Boss, MD 04/30/19 1334

## 2019-04-28 NOTE — ED Notes (Signed)
Pt has decided to leava against medical advice. Md aware and has spoken with him. Pt educated on necessity to follow up with PCP and cardiology. Pt agreeable. Pt a/o x4 and ambulating without difficulty.

## 2019-04-28 NOTE — ED Notes (Signed)
Pt has water at the bedside. Tolerating well at this time.

## 2019-05-03 ENCOUNTER — Ambulatory Visit: Payer: Medicare Other | Attending: Internal Medicine

## 2019-05-03 DIAGNOSIS — Z23 Encounter for immunization: Secondary | ICD-10-CM | POA: Insufficient documentation

## 2019-05-03 NOTE — Progress Notes (Signed)
   Covid-19 Vaccination Clinic  Name:  [name]    MRN: [id number] DOB: 1945/06/01  05/03/2019  Mr. [name] was observed post Covid-19 immunization for 15 minutes without incidence. He was provided with Vaccine Information Sheet and instruction to access the V-Safe system.   Mr. [name] was instructed to call 911 with any severe reactions post vaccine: Marland Kitchen Difficulty breathing  . Swelling of your face and throat  . A fast heartbeat  . A bad rash all over your body  . Dizziness and weakness    Immunizations Administered    Name Date Dose VIS Date Route   Pfizer COVID-19 Vaccine 05/03/2019 11:34 AM 0.3 mL 02/27/2019 Intramuscular   Manufacturer: [place]   Lot: [id number]   [place]: [id number]

## 2019-05-11 ENCOUNTER — Other Ambulatory Visit: Payer: Self-pay

## 2019-05-11 ENCOUNTER — Ambulatory Visit: Payer: Medicare Other | Admitting: Cardiology

## 2019-05-11 ENCOUNTER — Encounter: Payer: Self-pay | Admitting: Cardiology

## 2019-05-11 VITALS — BP 141/91 | HR 74 | Temp 96.2°F | Ht 77.0 in | Wt 239.4 lb

## 2019-05-11 DIAGNOSIS — I2510 Atherosclerotic heart disease of native coronary artery without angina pectoris: Secondary | ICD-10-CM

## 2019-05-11 DIAGNOSIS — Z794 Long term (current) use of insulin: Secondary | ICD-10-CM | POA: Diagnosis not present

## 2019-05-11 DIAGNOSIS — I10 Essential (primary) hypertension: Secondary | ICD-10-CM

## 2019-05-11 DIAGNOSIS — E119 Type 2 diabetes mellitus without complications: Secondary | ICD-10-CM | POA: Diagnosis not present

## 2019-05-11 NOTE — Progress Notes (Signed)
Patient is here for follow up visit.  Subjective:   [name], male    DOB: 1945/09/28, 74 y.o.   MRN: [id number]   Chief Complaint  Patient presents with  . Hypertension  . Follow-up    1 year    HPI  74 year old Caucasian male with hypertension, hyperlipidemia, type II diabetes mellitus, coronary artery disease status post successful RCA PCI 01/2018. moderate nonobstructive LCx disease.  He is doing well from cardiac standpoint and denies chest pain, shortness of breath, palpitations, leg edema, orthopnea, PND, TIA/syncope. He was walking up to 10,000 steps/day before weather got too cold. Two weeks ago, patient had an episode of syncope, while at ophthalmologist office. He had "something in his eye". He had not slept much the night before. He was at their office with his eyelid everted, is when he had sudden onset nausea, burping, lightheadedness, and loss of consciousness. He was taken to [facility] ED where initial workup was unremarkable. He was recommended hospitalization for observation, but he opted to leave as he was feeling better. He has not had any recurrence of these symptoms.   Current Outpatient Medications on File Prior to Visit  Medication Sig Dispense Refill  . aspirin 81 MG chewable tablet Chew 1 tablet (81 mg total) daily by mouth. 90 tablet 3  . finasteride (PROSCAR) 5 MG tablet Take 5 mg by mouth at bedtime.     Marland Kitchen losartan (COZAAR) 25 MG tablet Take 1 tablet by mouth once daily (Patient taking differently: Take 25 mg by mouth daily. ) 90 tablet 3  . metFORMIN (GLUCOPHAGE) 1000 MG tablet Take 1,000 mg 2 (two) times daily by mouth.   0  . rosuvastatin (CRESTOR) 20 MG tablet Take 1 tablet by mouth once daily (Patient taking differently: Take 20 mg by mouth daily. ) 90 tablet 3  . tamsulosin (FLOMAX) 0.4 MG CAPS capsule Take 0.4 mg by mouth at bedtime.      No current facility-administered medications on file prior to visit.    Cardiovascular  studies:  EKG 05/11/2019: Sinus rhythm 59 bpm. Old inferior infarct.   Coronary angiography/intervention 01/28/2017: Severe prox-mid RCA and Rt PDA stenoses Moderate LCx disease   Successful percutaneous coronary intervention  Direct stenting Resolute 2.25 x 12 mm PDA  PTCA and drug-eluting stent stent placement Resolute 4.0 x 30 mm proximal RCA Medical management for moderate LCx disease.    Echocardiogram 12/25/2016: Left ventricle cavity is normal in size. Moderate concentric hypertrophy of the left ventricle. Normal global wall motion. Doppler evidence of grade I (impaired) diastolic dysfunction, normal LAP. Calculated EF 55%. No hemodynamically significant valvular abnormality. Normal right atrial pressure  Recent labs: 04/28/2019: Glucose 215, BUN/Cr 19/1.1. EGFR >60. Na/K 138/3.9.  H/H 13.8/40.7. MCV 89. Platelets 147   Review of Systems  Cardiovascular: Negative for chest pain, dyspnea on exertion, leg swelling, palpitations and syncope.       Objective:    Vitals:   05/11/19 1031 05/11/19 1052  BP: (!) 155/86 (!) 141/91  Pulse: 76 74  Temp: (!) 96.2 F (35.7 C)   SpO2: 99%      Physical Exam  Constitutional: He appears well-developed and well-nourished.  Neck: No JVD present.  Cardiovascular: Normal rate, regular rhythm, normal heart sounds and intact distal pulses.  No murmur heard. Pulmonary/Chest: Effort normal and breath sounds normal. He has no wheezes. He has no rales.  Musculoskeletal:        General: No edema.  Nursing note  and vitals reviewed.       Assessment & Recommendations:   74 year old Caucasian male with hypertension, hyperlipidemia, type II diabetes mellitus, coronary artery disease status post successful RCA PCI 01/2018. moderate nonobstructive LCx disease.  Syncope: Appears to have been vasovagal syncope in the setting of pain and anxiety related to MI procedure.  No recurrence.  Recommend liberal hydration.  He is not on a  beta-blocker.  Given recent syncope, is probably not the right time to start him on it.  CAD: Stable. Continue Aspirin, statin.  Hypertension: Controlled on losartan  Type 2 DM: On metformin. Patient is not willing to consider Jardiance at this time.  Hyperlipiemia: Continue rosuvastatin 20 mg daily.  Patient has appointment with his PCP next month.  Will get labs.  F/u in 1 year.  Nigel Mormon, MD [facility] [place] Pager: [phone] Office: [phone] If no answer Cell [phone]

## 2019-05-25 DIAGNOSIS — E118 Type 2 diabetes mellitus with unspecified complications: Secondary | ICD-10-CM | POA: Diagnosis not present

## 2019-05-25 DIAGNOSIS — E782 Mixed hyperlipidemia: Secondary | ICD-10-CM | POA: Diagnosis not present

## 2019-05-25 DIAGNOSIS — E119 Type 2 diabetes mellitus without complications: Secondary | ICD-10-CM | POA: Diagnosis not present

## 2019-05-25 DIAGNOSIS — E1121 Type 2 diabetes mellitus with diabetic nephropathy: Secondary | ICD-10-CM | POA: Diagnosis not present

## 2019-05-25 DIAGNOSIS — I10 Essential (primary) hypertension: Secondary | ICD-10-CM | POA: Diagnosis not present

## 2019-05-25 DIAGNOSIS — D509 Iron deficiency anemia, unspecified: Secondary | ICD-10-CM | POA: Diagnosis not present

## 2019-05-25 DIAGNOSIS — Z7984 Long term (current) use of oral hypoglycemic drugs: Secondary | ICD-10-CM | POA: Diagnosis not present

## 2019-05-25 DIAGNOSIS — N401 Enlarged prostate with lower urinary tract symptoms: Secondary | ICD-10-CM | POA: Diagnosis not present

## 2019-05-25 DIAGNOSIS — E785 Hyperlipidemia, unspecified: Secondary | ICD-10-CM | POA: Diagnosis not present

## 2019-05-26 ENCOUNTER — Ambulatory Visit: Payer: Medicare Other | Attending: Internal Medicine

## 2019-05-26 DIAGNOSIS — Z23 Encounter for immunization: Secondary | ICD-10-CM | POA: Insufficient documentation

## 2019-05-26 NOTE — Progress Notes (Signed)
   Covid-19 Vaccination Clinic  Name:  [name]    MRN: [id number] DOB: March 14, 1946  05/26/2019  Mr. [name] was observed post Covid-19 immunization for 15 minutes without incident. He was provided with Vaccine Information Sheet and instruction to access the V-Safe system.   Mr. [name] was instructed to call [phone] with any severe reactions post vaccine: Marland Kitchen Difficulty breathing  . Swelling of face and throat  . A fast heartbeat  . A bad rash all over body  . Dizziness and weakness   Immunizations Administered    Name Date Dose VIS Date Route   Pfizer COVID-19 Vaccine 05/26/2019  2:33 PM 0.3 mL 02/27/2019 Intramuscular   Manufacturer: [place]   Lot: [id number]   [place]: [id number]

## 2019-07-22 IMAGING — MR MR CERVICAL SPINE W/O CM
4 of 5 series · 24 of 48 positions shown · non-contrast
Comparison: Cervical spine radiographs 07/05/2016

CLINICAL DATA: Left neck and shoulder pain since lifting weights in
[DATE].

EXAM:
MRI CERVICAL SPINE WITHOUT CONTRAST
TECHNIQUE: Multiplanar, multisequence MR imaging of the cervical spine was
performed. No intravenous contrast was administered.

[Series 4: T1 · sagittal · 3.3mm · 0.43mm/px · 7 of 13 slices shown]
[im 1/13]
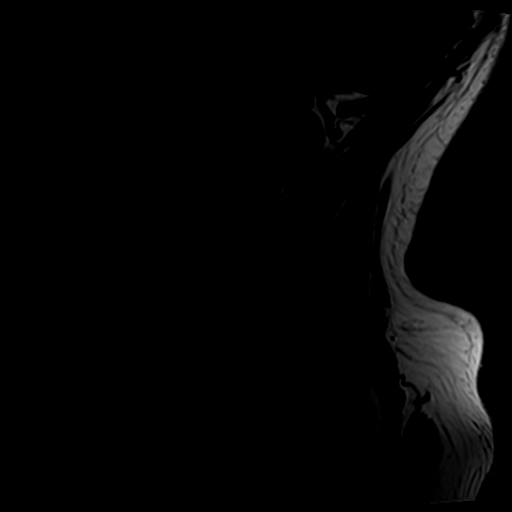
[im 3/13]
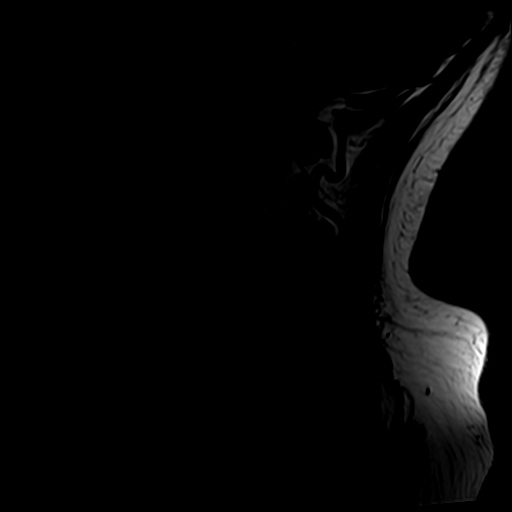
[im 5/13]
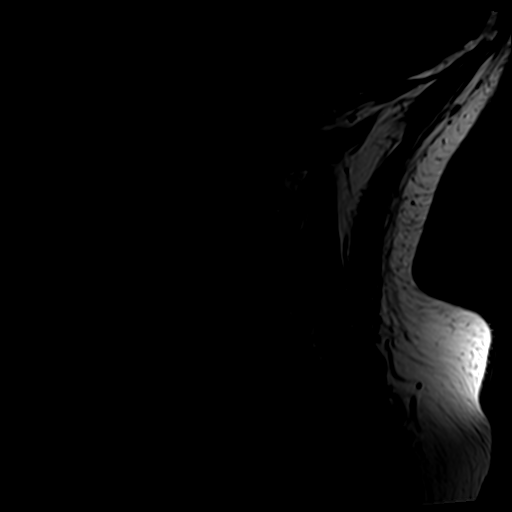
[im 7/13]
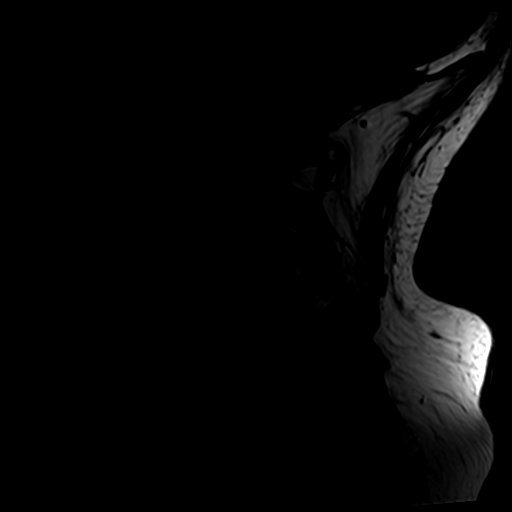
[im 9/13]
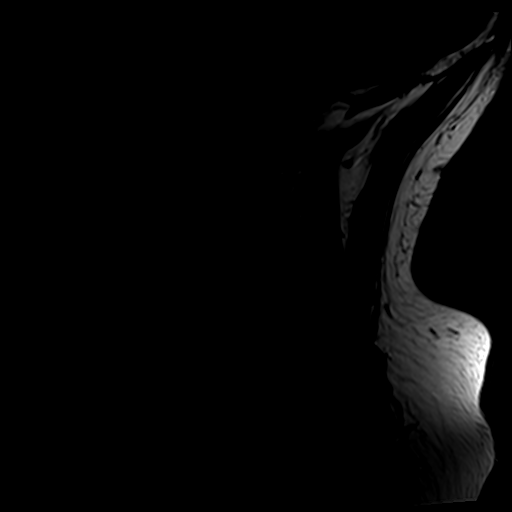
[im 11/13]
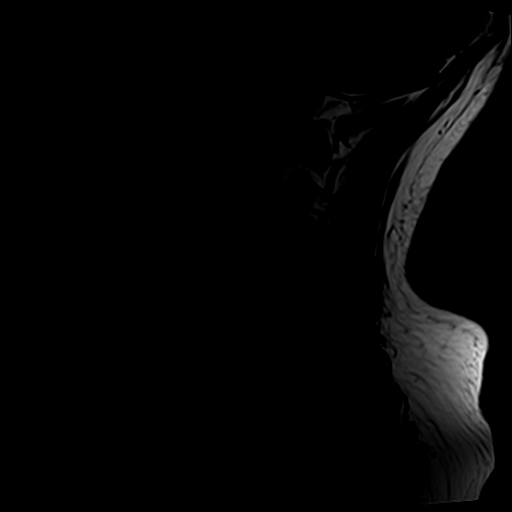
[im 13/13]
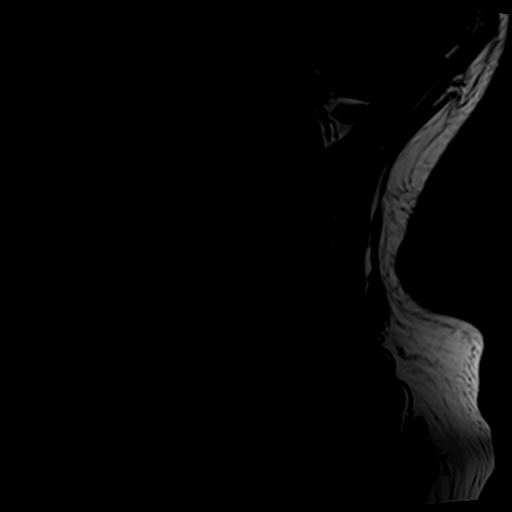

[Series 5: tir sag · sagittal · 3.3mm · 0.43mm/px · 3 of 13 slices shown]
[im 3/13]
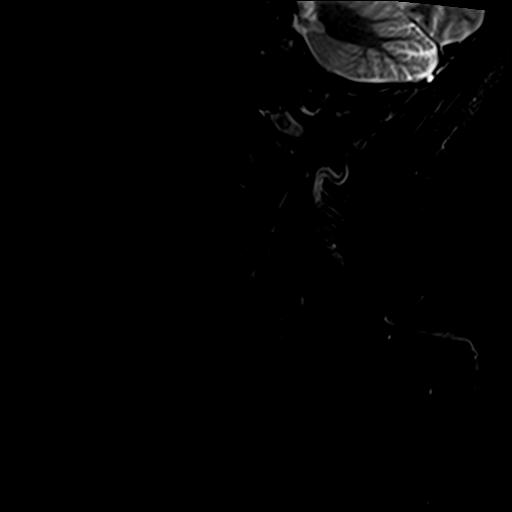
[im 8/13]
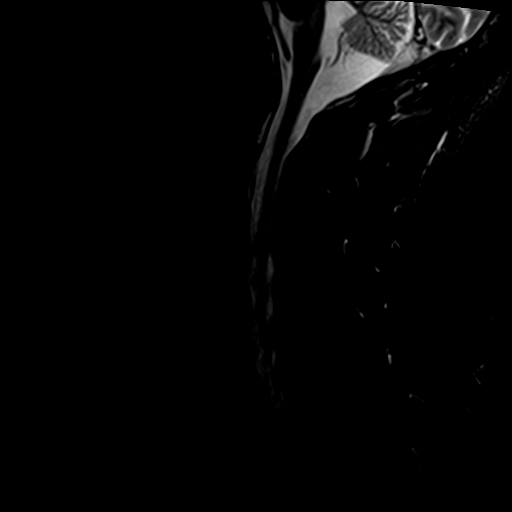
[im 13/13]
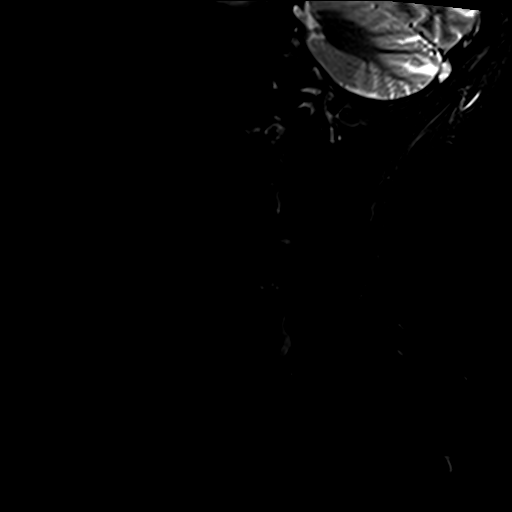

[Series 7: T2 · axial · 3.0mm · 0.74mm/px · z∈[-128,-1]mm · 8 of 29 slices shown (1 of 2)]
[im 1/29]
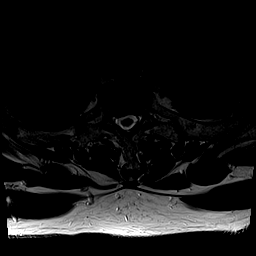
[im 5/29]
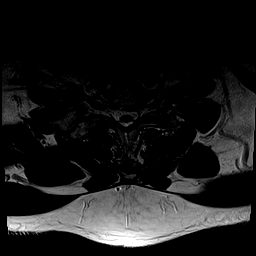
[im 9/29]
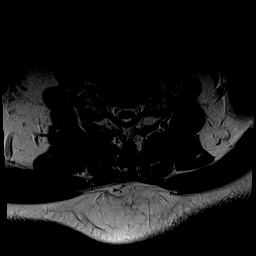
[im 13/29]
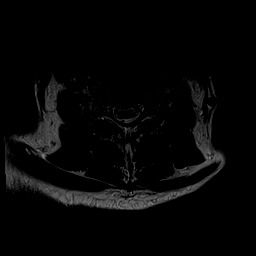
[im 16/29]
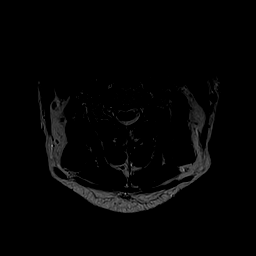
[im 20/29]
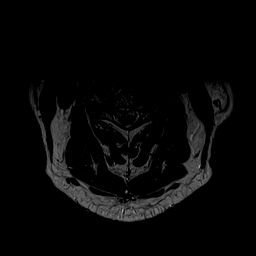
[im 24/29]
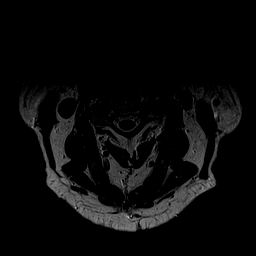
[im 29/29]
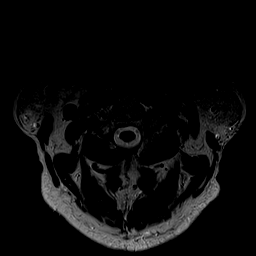

[Series 8: T2 · sagittal · 3.3mm · 0.43mm/px · 6 of 13 slices shown (2 of 2)]
[im 1/13]
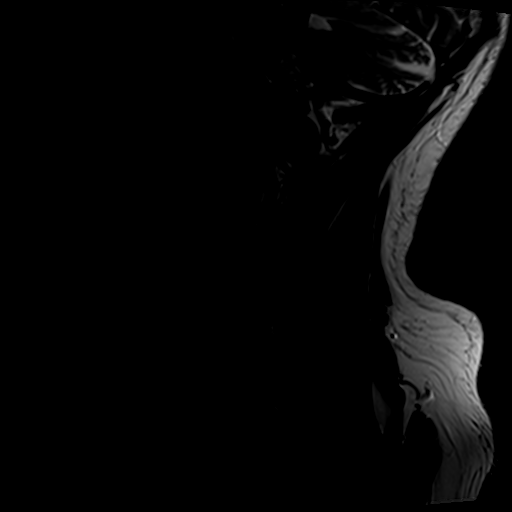
[im 3/13]
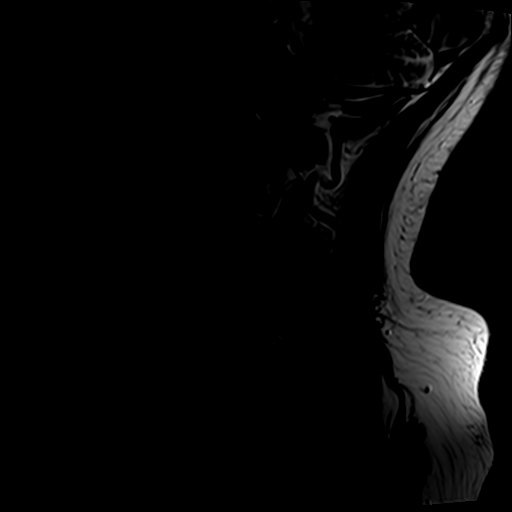
[im 5/13]
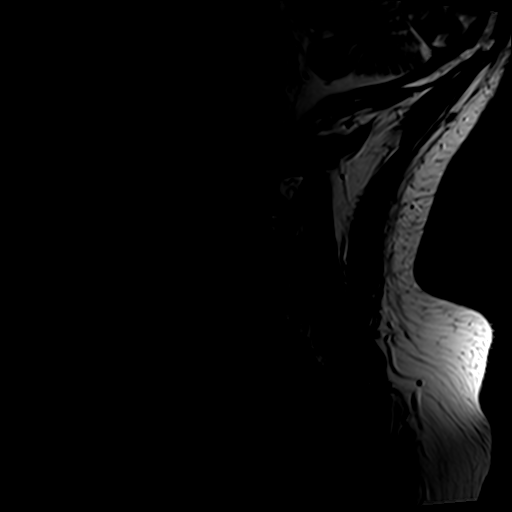
[im 8/13]
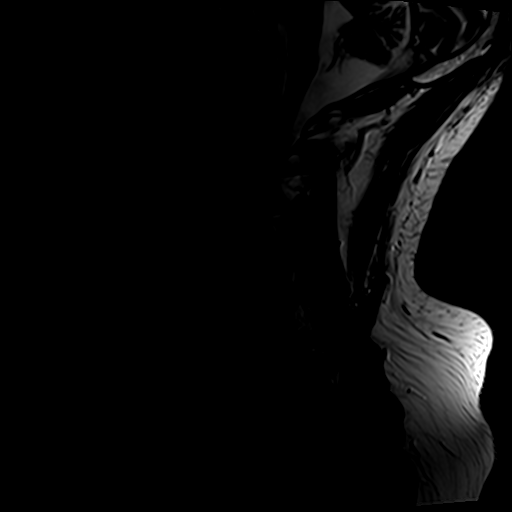
[im 10/13]
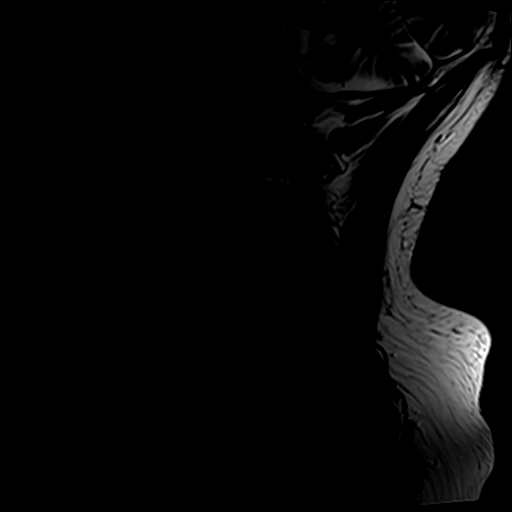
[im 13/13]
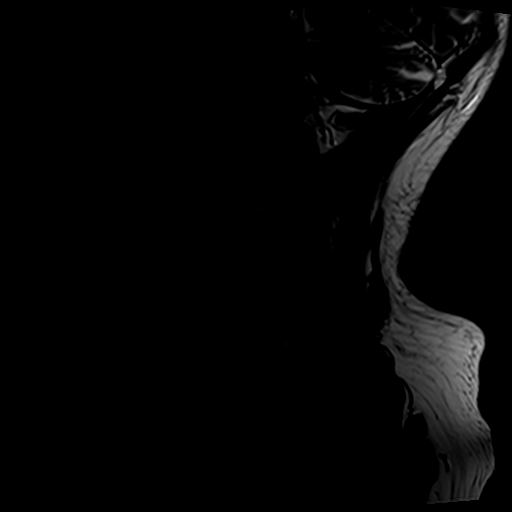

[24 of 48 positions shown; findings below may reference images not displayed]

FINDINGS: Alignment: Trace anterolisthesis of C4 on C5.

Vertebrae: No fracture or osseous lesion. Mild degenerative facet
edema on the right at C7-T1 and on the left at C4-5.

Cord: Normal signal and morphology.

Posterior Fossa, vertebral arteries, paraspinal tissues:
Unremarkable.

Disc levels:

C2-3: Mild right uncovertebral spurring and minimal facet arthrosis
without stenosis.

C3-4: Mild uncovertebral spurring and mild right and moderate left
facet arthrosis result in mild right neural foraminal stenosis
without spinal stenosis.

C4-5: Slight disc space narrowing. Broad central disc protrusion
results in mild spinal stenosis. Uncovertebral spurring and severe
left facet arthrosis result in borderline to mild right and moderate
left neural foraminal stenosis potentially affecting the left L5
nerve root.

C5-6: Slight disc space narrowing. Minimal disc bulging and
uncovertebral spurring without stenosis.

C6-7: Shallow central disc protrusion and minimal right
uncovertebral spurring result in borderline right neural foraminal
stenosis without spinal stenosis.

C7-T1: Shallow central/ left paracentral disc protrusion and mild
facet arthrosis without stenosis.

T1-2: Small right paracentral disc extrusion and endplate spurring
without significant stenosis.
IMPRESSION: Multilevel cervical disc degeneration, greatest at C4-5 where there
is mild spinal stenosis and moderate left neural foraminal stenosis.

## 2019-08-06 DIAGNOSIS — I10 Essential (primary) hypertension: Secondary | ICD-10-CM | POA: Diagnosis not present

## 2019-08-06 DIAGNOSIS — D509 Iron deficiency anemia, unspecified: Secondary | ICD-10-CM | POA: Diagnosis not present

## 2019-08-06 DIAGNOSIS — E119 Type 2 diabetes mellitus without complications: Secondary | ICD-10-CM | POA: Diagnosis not present

## 2019-08-06 DIAGNOSIS — E782 Mixed hyperlipidemia: Secondary | ICD-10-CM | POA: Diagnosis not present

## 2019-08-06 DIAGNOSIS — N401 Enlarged prostate with lower urinary tract symptoms: Secondary | ICD-10-CM | POA: Diagnosis not present

## 2019-08-06 DIAGNOSIS — E1121 Type 2 diabetes mellitus with diabetic nephropathy: Secondary | ICD-10-CM | POA: Diagnosis not present

## 2019-08-06 DIAGNOSIS — E785 Hyperlipidemia, unspecified: Secondary | ICD-10-CM | POA: Diagnosis not present

## 2019-08-06 DIAGNOSIS — E118 Type 2 diabetes mellitus with unspecified complications: Secondary | ICD-10-CM | POA: Diagnosis not present

## 2019-09-28 DIAGNOSIS — E118 Type 2 diabetes mellitus with unspecified complications: Secondary | ICD-10-CM | POA: Diagnosis not present

## 2019-09-28 DIAGNOSIS — E785 Hyperlipidemia, unspecified: Secondary | ICD-10-CM | POA: Diagnosis not present

## 2019-09-28 DIAGNOSIS — E782 Mixed hyperlipidemia: Secondary | ICD-10-CM | POA: Diagnosis not present

## 2019-09-28 DIAGNOSIS — D509 Iron deficiency anemia, unspecified: Secondary | ICD-10-CM | POA: Diagnosis not present

## 2019-09-28 DIAGNOSIS — E1121 Type 2 diabetes mellitus with diabetic nephropathy: Secondary | ICD-10-CM | POA: Diagnosis not present

## 2019-09-28 DIAGNOSIS — N401 Enlarged prostate with lower urinary tract symptoms: Secondary | ICD-10-CM | POA: Diagnosis not present

## 2019-09-28 DIAGNOSIS — I10 Essential (primary) hypertension: Secondary | ICD-10-CM | POA: Diagnosis not present

## 2019-11-13 DIAGNOSIS — E785 Hyperlipidemia, unspecified: Secondary | ICD-10-CM | POA: Diagnosis not present

## 2019-11-13 DIAGNOSIS — E782 Mixed hyperlipidemia: Secondary | ICD-10-CM | POA: Diagnosis not present

## 2019-11-13 DIAGNOSIS — E1121 Type 2 diabetes mellitus with diabetic nephropathy: Secondary | ICD-10-CM | POA: Diagnosis not present

## 2019-11-13 DIAGNOSIS — D509 Iron deficiency anemia, unspecified: Secondary | ICD-10-CM | POA: Diagnosis not present

## 2019-11-13 DIAGNOSIS — N401 Enlarged prostate with lower urinary tract symptoms: Secondary | ICD-10-CM | POA: Diagnosis not present

## 2019-11-13 DIAGNOSIS — I10 Essential (primary) hypertension: Secondary | ICD-10-CM | POA: Diagnosis not present

## 2019-11-13 DIAGNOSIS — E118 Type 2 diabetes mellitus with unspecified complications: Secondary | ICD-10-CM | POA: Diagnosis not present

## 2019-12-04 DIAGNOSIS — Z23 Encounter for immunization: Secondary | ICD-10-CM | POA: Diagnosis not present

## 2019-12-04 DIAGNOSIS — M25522 Pain in left elbow: Secondary | ICD-10-CM | POA: Diagnosis not present

## 2019-12-09 DIAGNOSIS — D509 Iron deficiency anemia, unspecified: Secondary | ICD-10-CM | POA: Diagnosis not present

## 2019-12-09 DIAGNOSIS — N401 Enlarged prostate with lower urinary tract symptoms: Secondary | ICD-10-CM | POA: Diagnosis not present

## 2019-12-09 DIAGNOSIS — I10 Essential (primary) hypertension: Secondary | ICD-10-CM | POA: Diagnosis not present

## 2019-12-09 DIAGNOSIS — E782 Mixed hyperlipidemia: Secondary | ICD-10-CM | POA: Diagnosis not present

## 2019-12-09 DIAGNOSIS — E1121 Type 2 diabetes mellitus with diabetic nephropathy: Secondary | ICD-10-CM | POA: Diagnosis not present

## 2019-12-09 DIAGNOSIS — E118 Type 2 diabetes mellitus with unspecified complications: Secondary | ICD-10-CM | POA: Diagnosis not present

## 2019-12-09 DIAGNOSIS — E785 Hyperlipidemia, unspecified: Secondary | ICD-10-CM | POA: Diagnosis not present

## 2020-01-07 DIAGNOSIS — E782 Mixed hyperlipidemia: Secondary | ICD-10-CM | POA: Diagnosis not present

## 2020-01-07 DIAGNOSIS — I10 Essential (primary) hypertension: Secondary | ICD-10-CM | POA: Diagnosis not present

## 2020-01-07 DIAGNOSIS — E1121 Type 2 diabetes mellitus with diabetic nephropathy: Secondary | ICD-10-CM | POA: Diagnosis not present

## 2020-01-07 DIAGNOSIS — E118 Type 2 diabetes mellitus with unspecified complications: Secondary | ICD-10-CM | POA: Diagnosis not present

## 2020-01-07 DIAGNOSIS — N401 Enlarged prostate with lower urinary tract symptoms: Secondary | ICD-10-CM | POA: Diagnosis not present

## 2020-01-07 DIAGNOSIS — E785 Hyperlipidemia, unspecified: Secondary | ICD-10-CM | POA: Diagnosis not present

## 2020-01-07 DIAGNOSIS — D509 Iron deficiency anemia, unspecified: Secondary | ICD-10-CM | POA: Diagnosis not present

## 2020-02-17 DIAGNOSIS — Z23 Encounter for immunization: Secondary | ICD-10-CM | POA: Diagnosis not present

## 2020-03-14 DIAGNOSIS — L814 Other melanin hyperpigmentation: Secondary | ICD-10-CM | POA: Diagnosis not present

## 2020-03-14 DIAGNOSIS — L570 Actinic keratosis: Secondary | ICD-10-CM | POA: Diagnosis not present

## 2020-03-14 DIAGNOSIS — D225 Melanocytic nevi of trunk: Secondary | ICD-10-CM | POA: Diagnosis not present

## 2020-03-14 DIAGNOSIS — Z85828 Personal history of other malignant neoplasm of skin: Secondary | ICD-10-CM | POA: Diagnosis not present

## 2020-03-14 DIAGNOSIS — L578 Other skin changes due to chronic exposure to nonionizing radiation: Secondary | ICD-10-CM | POA: Diagnosis not present

## 2020-03-14 DIAGNOSIS — L821 Other seborrheic keratosis: Secondary | ICD-10-CM | POA: Diagnosis not present

## 2020-03-21 DIAGNOSIS — Z7984 Long term (current) use of oral hypoglycemic drugs: Secondary | ICD-10-CM | POA: Diagnosis not present

## 2020-03-21 DIAGNOSIS — E782 Mixed hyperlipidemia: Secondary | ICD-10-CM | POA: Diagnosis not present

## 2020-03-21 DIAGNOSIS — E785 Hyperlipidemia, unspecified: Secondary | ICD-10-CM | POA: Diagnosis not present

## 2020-03-21 DIAGNOSIS — E1121 Type 2 diabetes mellitus with diabetic nephropathy: Secondary | ICD-10-CM | POA: Diagnosis not present

## 2020-03-21 DIAGNOSIS — D509 Iron deficiency anemia, unspecified: Secondary | ICD-10-CM | POA: Diagnosis not present

## 2020-03-21 DIAGNOSIS — N401 Enlarged prostate with lower urinary tract symptoms: Secondary | ICD-10-CM | POA: Diagnosis not present

## 2020-03-21 DIAGNOSIS — I10 Essential (primary) hypertension: Secondary | ICD-10-CM | POA: Diagnosis not present

## 2020-03-21 DIAGNOSIS — E118 Type 2 diabetes mellitus with unspecified complications: Secondary | ICD-10-CM | POA: Diagnosis not present

## 2020-04-05 DIAGNOSIS — N401 Enlarged prostate with lower urinary tract symptoms: Secondary | ICD-10-CM | POA: Diagnosis not present

## 2020-04-05 DIAGNOSIS — R972 Elevated prostate specific antigen [PSA]: Secondary | ICD-10-CM | POA: Diagnosis not present

## 2020-04-05 DIAGNOSIS — R3912 Poor urinary stream: Secondary | ICD-10-CM | POA: Diagnosis not present

## 2020-04-19 DIAGNOSIS — H524 Presbyopia: Secondary | ICD-10-CM | POA: Diagnosis not present

## 2020-04-19 DIAGNOSIS — E119 Type 2 diabetes mellitus without complications: Secondary | ICD-10-CM | POA: Diagnosis not present

## 2020-04-19 DIAGNOSIS — H5213 Myopia, bilateral: Secondary | ICD-10-CM | POA: Diagnosis not present

## 2020-04-19 DIAGNOSIS — Z961 Presence of intraocular lens: Secondary | ICD-10-CM | POA: Diagnosis not present

## 2020-05-09 ENCOUNTER — Other Ambulatory Visit: Payer: Self-pay

## 2020-05-09 ENCOUNTER — Ambulatory Visit: Payer: Medicare Other | Admitting: Cardiology

## 2020-05-09 ENCOUNTER — Encounter: Payer: Self-pay | Admitting: Cardiology

## 2020-05-09 VITALS — BP 137/84 | HR 70 | Temp 96.6°F | Resp 16 | Ht 77.0 in | Wt 238.0 lb

## 2020-05-09 DIAGNOSIS — I2510 Atherosclerotic heart disease of native coronary artery without angina pectoris: Secondary | ICD-10-CM

## 2020-05-09 DIAGNOSIS — E119 Type 2 diabetes mellitus without complications: Secondary | ICD-10-CM

## 2020-05-09 DIAGNOSIS — Z794 Long term (current) use of insulin: Secondary | ICD-10-CM

## 2020-05-09 DIAGNOSIS — I10 Essential (primary) hypertension: Secondary | ICD-10-CM | POA: Diagnosis not present

## 2020-05-09 NOTE — Progress Notes (Signed)
Patient is here for follow up visit.  Subjective:   [name], male    DOB: 1946/03/06, 75 y.o.   MRN: [id number]   Chief Complaint  Patient presents with  . Hypertension  . Follow-up    1 year  . Coronary Artery Disease    HPI  75 year old Caucasian male with hypertension, hyperlipidemia, type II diabetes mellitus, coronary artery disease status post successful RCA PCI 01/2018. moderate nonobstructive LCx disease.  Patient is doing well from cardiac standpoint. He denies chest pain, shortness of breath, palpitations, leg edema, orthopnea, PND, TIA/syncope. He walks regularly, does 240 stps at [employer], without any difficulty. Recently, he hurt is back after lifting a 60 lb box, and is recovering from that. Reviewed recent labs with the patient, details below.     Current Outpatient Medications on File Prior to Visit  Medication Sig Dispense Refill  . aspirin 81 MG chewable tablet Chew 1 tablet (81 mg total) daily by mouth. 90 tablet 3  . finasteride (PROSCAR) 5 MG tablet Take 5 mg by mouth at bedtime.     Marland Kitchen losartan (COZAAR) 25 MG tablet Take 1 tablet by mouth once daily (Patient taking differently: Take 25 mg by mouth daily. ) 90 tablet 3  . metFORMIN (GLUCOPHAGE) 1000 MG tablet Take 1,000 mg 2 (two) times daily by mouth.   0  . rosuvastatin (CRESTOR) 20 MG tablet Take 1 tablet by mouth once daily (Patient taking differently: Take 20 mg by mouth daily. ) 90 tablet 3  . tamsulosin (FLOMAX) 0.4 MG CAPS capsule Take 0.4 mg by mouth at bedtime.      No current facility-administered medications on file prior to visit.    Cardiovascular studies:  EKG 05/09/2020: Sinus rhythm 58 bpm  Within normal limits  Coronary angiography/intervention 01/28/2017: Severe prox-mid RCA and Rt PDA stenoses Moderate LCx disease   Successful percutaneous coronary intervention  Direct stenting Resolute 2.25 x 12 mm PDA  PTCA and drug-eluting stent stent placement Resolute 4.0 x 30 mm  proximal RCA Medical management for moderate LCx disease.    Echocardiogram 12/25/2016: Left ventricle cavity is normal in size. Moderate concentric hypertrophy of the left ventricle. Normal global wall motion. Doppler evidence of grade I (impaired) diastolic dysfunction, normal LAP. Calculated EF 55%. No hemodynamically significant valvular abnormality. Normal right atrial pressure  Recent labs: 03/21/2020: Glucose N/A, BUN/Cr 21/1.0. EGFR 60. HbA1C 6.0% Chol 103, TG 47, HDL 46, LDL 45 TSH N/A  04/28/2019: Glucose 215, BUN/Cr 19/1.1. EGFR >60. Na/K 138/3.9.  H/H 13.8/40.7. MCV 89. Platelets 147   Review of Systems  Cardiovascular: Negative for chest pain, dyspnea on exertion, leg swelling, palpitations and syncope.       Objective:    Vitals:   05/09/20 1011  BP: 137/84  Pulse: 70  Resp: 16  Temp: (!) 96.6 F (35.9 C)  SpO2: 99%     Physical Exam Vitals and nursing note reviewed.  Constitutional:      Appearance: He is well-developed.  Neck:     Vascular: No JVD.  Cardiovascular:     Rate and Rhythm: Normal rate and regular rhythm.     Pulses: Intact distal pulses.     Heart sounds: Normal heart sounds. No murmur heard.   Pulmonary:     Effort: Pulmonary effort is normal.     Breath sounds: Normal breath sounds. No wheezing or rales.         Assessment & Recommendations:   75 year old Caucasian  male with hypertension, hyperlipidemia, type II diabetes mellitus, coronary artery disease status post successful RCA PCI 01/2018. moderate nonobstructive LCx disease.  Syncope: No recurrence  CAD without angina: Stable. Continue Aspirin, statin.  Hypertension: Controlled on losartan  Type 2 DM: Controlled on metformin.   Hyperlipiemia: Controlled on rosuvastatin 20 mg daily.  F/u in 1 year.  Nigel Mormon, MD [facility] [place] Pager: [phone] Office: [phone] If no answer Cell [phone]

## 2020-07-12 DIAGNOSIS — E782 Mixed hyperlipidemia: Secondary | ICD-10-CM | POA: Diagnosis not present

## 2020-07-12 DIAGNOSIS — E118 Type 2 diabetes mellitus with unspecified complications: Secondary | ICD-10-CM | POA: Diagnosis not present

## 2020-07-12 DIAGNOSIS — D509 Iron deficiency anemia, unspecified: Secondary | ICD-10-CM | POA: Diagnosis not present

## 2020-07-12 DIAGNOSIS — E785 Hyperlipidemia, unspecified: Secondary | ICD-10-CM | POA: Diagnosis not present

## 2020-07-12 DIAGNOSIS — E1121 Type 2 diabetes mellitus with diabetic nephropathy: Secondary | ICD-10-CM | POA: Diagnosis not present

## 2020-07-12 DIAGNOSIS — I10 Essential (primary) hypertension: Secondary | ICD-10-CM | POA: Diagnosis not present

## 2020-07-12 DIAGNOSIS — N401 Enlarged prostate with lower urinary tract symptoms: Secondary | ICD-10-CM | POA: Diagnosis not present

## 2020-09-08 DIAGNOSIS — Z0000 Encounter for general adult medical examination without abnormal findings: Secondary | ICD-10-CM | POA: Diagnosis not present

## 2020-09-08 DIAGNOSIS — Z1389 Encounter for screening for other disorder: Secondary | ICD-10-CM | POA: Diagnosis not present

## 2020-11-15 DIAGNOSIS — E1121 Type 2 diabetes mellitus with diabetic nephropathy: Secondary | ICD-10-CM | POA: Diagnosis not present

## 2020-11-15 DIAGNOSIS — E782 Mixed hyperlipidemia: Secondary | ICD-10-CM | POA: Diagnosis not present

## 2020-11-15 DIAGNOSIS — E118 Type 2 diabetes mellitus with unspecified complications: Secondary | ICD-10-CM | POA: Diagnosis not present

## 2020-11-15 DIAGNOSIS — E785 Hyperlipidemia, unspecified: Secondary | ICD-10-CM | POA: Diagnosis not present

## 2020-11-15 DIAGNOSIS — I10 Essential (primary) hypertension: Secondary | ICD-10-CM | POA: Diagnosis not present

## 2020-11-15 DIAGNOSIS — N401 Enlarged prostate with lower urinary tract symptoms: Secondary | ICD-10-CM | POA: Diagnosis not present

## 2020-11-16 ENCOUNTER — Ambulatory Visit: Payer: Medicare Other | Admitting: Student

## 2020-11-16 ENCOUNTER — Other Ambulatory Visit: Payer: Self-pay

## 2020-11-16 ENCOUNTER — Encounter: Payer: Self-pay | Admitting: Student

## 2020-11-16 VITALS — BP 118/67 | HR 99 | Temp 98.9°F | Resp 16 | Ht 77.0 in | Wt 236.4 lb

## 2020-11-16 DIAGNOSIS — I10 Essential (primary) hypertension: Secondary | ICD-10-CM

## 2020-11-16 DIAGNOSIS — R55 Syncope and collapse: Secondary | ICD-10-CM | POA: Diagnosis not present

## 2020-11-16 DIAGNOSIS — R42 Dizziness and giddiness: Secondary | ICD-10-CM

## 2020-11-16 NOTE — Progress Notes (Signed)
Patient is here for follow up visit.  Subjective:   [name], male    DOB: 1946-01-21, 75 y.o.   MRN: [id number]   Chief Complaint  Patient presents with   Dizziness   Hypertension    HPI  75 year old Caucasian male with hypertension, hyperlipidemia, type II diabetes mellitus, coronary artery disease status post successful RCA PCI 01/2018. moderate nonobstructive LCx disease.  Presents for urgent visit.  He states over the last 5 weeks when he is walking his dog in the middle of the day around lunchtime he has episodes of dizziness requiring him to sit down and rest.  He states he also walks his dog in the evening, which is typically a longer walk and during which he does 200 stairs without issue.  He reports home blood pressure readings average 117/60 mmHg and resting heart rate at home is about 65 bpm. Patient presently takes losartan at around 2AM (when he goes to bed). He also admits to little water intake throughout the day, in fact primarily drinks iced tea.   Denies chest pain, shortness of breath, palpitations, leg edema, orthopnea, PND, TIA/syncope.   Current Outpatient Medications on File Prior to Visit  Medication Sig Dispense Refill   aspirin 325 MG tablet Take 325 mg by mouth daily.     finasteride (PROSCAR) 5 MG tablet Take 5 mg by mouth at bedtime.      losartan (COZAAR) 25 MG tablet Take 1 tablet by mouth once daily (Patient taking differently: Take 25 mg by mouth daily.) 90 tablet 3   metFORMIN (GLUCOPHAGE) 1000 MG tablet Take 1,000 mg 2 (two) times daily by mouth.   0   rosuvastatin (CRESTOR) 20 MG tablet Take 1 tablet by mouth once daily (Patient taking differently: Take 20 mg by mouth daily.) 90 tablet 3   tamsulosin (FLOMAX) 0.4 MG CAPS capsule Take 0.4 mg by mouth at bedtime.      No current facility-administered medications on file prior to visit.    Cardiovascular studies: EKG 11/16/2020: Sinus rhythm at a rate of 90 bpm.  Normal axis.  No evidence  of ischemia or underlying injury pattern.  EKG 05/09/2020: Sinus rhythm 58 bpm  Within normal limits  Coronary angiography/intervention 01/28/2017: Severe prox-mid RCA and Rt PDA stenoses Moderate LCx disease   Successful percutaneous coronary intervention  Direct stenting Resolute 2.25 x 12 mm PDA  PTCA and drug-eluting stent stent placement Resolute 4.0 x 30 mm proximal RCA Medical management for moderate LCx disease.    Echocardiogram 12/25/2016: Left ventricle cavity is normal in size. Moderate concentric hypertrophy of the left ventricle. Normal global wall motion. Doppler evidence of grade I (impaired) diastolic dysfunction, normal LAP. Calculated EF 55%. No hemodynamically significant valvular abnormality. Normal right atrial pressure  Recent labs: 03/21/2020: Glucose N/A, BUN/Cr 21/1.0. EGFR 60. HbA1C 6.0% Chol 103, TG 47, HDL 46, LDL 45 TSH N/A  04/28/2019: Glucose 215, BUN/Cr 19/1.1. EGFR >60. Na/K 138/3.9.  H/H 13.8/40.7. MCV 89. Platelets 147   Review of Systems  Constitutional: Negative for malaise/fatigue and weight gain.  Cardiovascular:  Negative for chest pain, claudication, dyspnea on exertion, leg swelling, near-syncope, orthopnea, palpitations, paroxysmal nocturnal dyspnea and syncope.  Respiratory:  Negative for shortness of breath.   Neurological:  Positive for dizziness.      Objective:    Vitals:   11/16/20 1514 11/16/20 1515  BP:    Pulse:    Resp:    Temp:    SpO2: 98% 97%  Physical Exam Vitals and nursing note reviewed.  Constitutional:      Appearance: He is well-developed.  Neck:     Vascular: No JVD.  Cardiovascular:     Rate and Rhythm: Normal rate and regular rhythm.     Pulses: Intact distal pulses.     Heart sounds: Normal heart sounds. No murmur heard. Pulmonary:     Effort: Pulmonary effort is normal.     Breath sounds: Normal breath sounds. No wheezing or rales.  Musculoskeletal:     Right lower leg: No edema.      Left lower leg: No edema.   Orthostatic VS for the past 72 hrs (Last 3 readings):  Orthostatic BP Patient Position BP Location Cuff Size Orthostatic Pulse  11/16/20 1515 126/73 Standing Left Arm Large 71  11/16/20 1514 134/76 Sitting Left Arm Large 93  11/16/20 1513 141/79 Supine Left Arm Large 88       Assessment & Recommendations:   75 year old Caucasian male with hypertension, hyperlipidemia, type II diabetes mellitus, coronary artery disease status post successful RCA PCI 01/2018. moderate nonobstructive LCx disease.  Dizziness:  Suspect patient's episodes of dizziness are related to dehydration and exercising in the heat of the day. Advised patient to increase water intake and monitor symptoms closely.  Patient is not orthostatic, and although BP was a bit soft at first check he continues to have supine hypertension.  Will not make changes to his medications at this time, however if patient continues to have dizziness despite liberal hydration could consider reducing or stopping losartan.  Also advised patient to take losartan at 8 PM instead of 2 AM.   Syncope: No recurrence  CAD without angina: Stable. Continue Aspirin, statin.  Hypertension: Controlled on losartan  Hyperlipiemia: Controlled on rosuvastatin 20 mg daily.  Follow up in 2 weeks, sooner if needed, for dizziness.    Alethia Berthold, PA-C 11/17/2020, 9:34 AM Office: [phone]

## 2020-11-29 NOTE — Progress Notes (Signed)
Patient is here for follow up visit.  Subjective:   [name], male    DOB: 06/13/1945, 74 y.o.   MRN: [id number]   Chief Complaint  Patient presents with   Dizziness   Follow-up    HPI  75 year old Caucasian male with hypertension, hyperlipidemia, type II diabetes mellitus, coronary artery disease status post successful RCA PCI 01/2018. moderate nonobstructive LCx disease.  Patient was seen in our office 11/16/2020 with concerns of dizziness during his afternoon walks.  Last office visit he was advised to increase water intake and to continue to monitor symptoms closely.  Also at last office visit advised patient to take losartan at 8 PM instead of 2 AM.  He now presents for follow-up.  Patient has increased water intake and is now taking losartan as directed.  He has had no recurrence of dizziness since last office visit.  He does report left hip pain worse with movement after long periods of sitting or lying in bed, symptoms suggestive of arthritis.  Denies chest pain, shortness of breath, palpitations, leg edema, orthopnea, PND, TIA/syncope.   Current Outpatient Medications on File Prior to Visit  Medication Sig Dispense Refill   aspirin 325 MG tablet Take 325 mg by mouth daily.     finasteride (PROSCAR) 5 MG tablet Take 5 mg by mouth at bedtime.      losartan (COZAAR) 25 MG tablet Take 1 tablet by mouth once daily (Patient taking differently: Take 25 mg by mouth daily.) 90 tablet 3   metFORMIN (GLUCOPHAGE) 1000 MG tablet Take 1,000 mg 2 (two) times daily by mouth.   0   rosuvastatin (CRESTOR) 20 MG tablet Take 1 tablet by mouth once daily (Patient taking differently: Take 20 mg by mouth daily.) 90 tablet 3   tamsulosin (FLOMAX) 0.4 MG CAPS capsule Take 0.4 mg by mouth at bedtime.      No current facility-administered medications on file prior to visit.    Cardiovascular studies: EKG 11/16/2020: Sinus rhythm at a rate of 90 bpm.  Normal axis.  No evidence of ischemia  or underlying injury pattern.  EKG 05/09/2020: Sinus rhythm 58 bpm  Within normal limits  Coronary angiography/intervention 01/28/2017: Severe prox-mid RCA and Rt PDA stenoses Moderate LCx disease   Successful percutaneous coronary intervention  Direct stenting Resolute 2.25 x 12 mm PDA  PTCA and drug-eluting stent stent placement Resolute 4.0 x 30 mm proximal RCA Medical management for moderate LCx disease.    Echocardiogram 12/25/2016: Left ventricle cavity is normal in size. Moderate concentric hypertrophy of the left ventricle. Normal global wall motion. Doppler evidence of grade I (impaired) diastolic dysfunction, normal LAP. Calculated EF 55%. No hemodynamically significant valvular abnormality. Normal right atrial pressure  Recent labs: 03/21/2020: Glucose N/A, BUN/Cr 21/1.0. EGFR 60. HbA1C 6.0% Chol 103, TG 47, HDL 46, LDL 45 TSH N/A  04/28/2019: Glucose 215, BUN/Cr 19/1.1. EGFR >60. Na/K 138/3.9.  H/H 13.8/40.7. MCV 89. Platelets 147   Review of Systems  Constitutional: Negative for malaise/fatigue and weight gain.  Cardiovascular:  Negative for chest pain, claudication, dyspnea on exertion, leg swelling, near-syncope, orthopnea, palpitations, paroxysmal nocturnal dyspnea and syncope.  Respiratory:  Negative for shortness of breath.   Neurological:  Negative for dizziness (resolved).      Objective:    Vitals:   11/30/20 1108 11/30/20 1109  BP: 132/63   Pulse: 63   Temp:    SpO2: 99% 99%     Physical Exam Vitals and nursing note reviewed.  Constitutional:      Appearance: He is well-developed.  Neck:     Vascular: No JVD.  Cardiovascular:     Rate and Rhythm: Normal rate and regular rhythm.     Pulses: Intact distal pulses.     Heart sounds: Normal heart sounds. No murmur heard. Pulmonary:     Effort: Pulmonary effort is normal.     Breath sounds: Normal breath sounds. No wheezing or rales.  Musculoskeletal:     Right lower leg: No edema.     Left  lower leg: No edema.  Physical exam unchanged compared to previous.   Orthostatic VS for the past 72 hrs (Last 3 readings):  Orthostatic BP Patient Position BP Location Cuff Size Orthostatic Pulse  11/30/20 1109 126/69 Standing Left Arm Normal 72  11/30/20 1108 -- Sitting Left Arm Normal --  11/30/20 1107 152/80 Supine Left Arm Normal 60       Assessment & Recommendations:   75 year old Caucasian male with hypertension, hyperlipidemia, type II diabetes mellitus, coronary artery disease status post successful RCA PCI 01/2018. moderate nonobstructive LCx disease.  Dizziness:  Patient has had no recurrence of dizziness since increasing fluid intake and switching losartan to 8 PM. Will not make changes to medications at this time. Advised patient to continue to stay well-hydrated and notify our office if he has recurrence of symptoms.  Syncope: No recurrence  CAD without angina: Stable. Continue Aspirin, statin.  Hypertension: Controlled on losartan  Hyperlipiemia: Controlled on rosuvastatin  In regard to hip pain advised patient to follow-up with PCP, symptoms are suggestive of arthritis.  Follow-up as previously scheduled in about 6 months with Dr. Virgina Jock.   Alethia Berthold, PA-C 11/30/2020, 11:58 AM Office: [phone]

## 2020-11-30 ENCOUNTER — Other Ambulatory Visit: Payer: Self-pay

## 2020-11-30 ENCOUNTER — Ambulatory Visit: Payer: Medicare Other | Admitting: Student

## 2020-11-30 ENCOUNTER — Encounter: Payer: Self-pay | Admitting: Student

## 2020-11-30 VITALS — BP 132/63 | HR 63 | Temp 97.3°F | Ht 77.0 in | Wt 237.0 lb

## 2020-11-30 DIAGNOSIS — I10 Essential (primary) hypertension: Secondary | ICD-10-CM | POA: Diagnosis not present

## 2020-11-30 DIAGNOSIS — R42 Dizziness and giddiness: Secondary | ICD-10-CM

## 2021-01-02 DIAGNOSIS — Z6827 Body mass index (BMI) 27.0-27.9, adult: Secondary | ICD-10-CM | POA: Diagnosis not present

## 2021-01-02 DIAGNOSIS — J019 Acute sinusitis, unspecified: Secondary | ICD-10-CM | POA: Diagnosis not present

## 2021-02-16 DIAGNOSIS — D225 Melanocytic nevi of trunk: Secondary | ICD-10-CM | POA: Diagnosis not present

## 2021-02-16 DIAGNOSIS — Z85828 Personal history of other malignant neoplasm of skin: Secondary | ICD-10-CM | POA: Diagnosis not present

## 2021-02-16 DIAGNOSIS — L578 Other skin changes due to chronic exposure to nonionizing radiation: Secondary | ICD-10-CM | POA: Diagnosis not present

## 2021-02-16 DIAGNOSIS — L821 Other seborrheic keratosis: Secondary | ICD-10-CM | POA: Diagnosis not present

## 2021-02-16 DIAGNOSIS — L570 Actinic keratosis: Secondary | ICD-10-CM | POA: Diagnosis not present

## 2021-03-18 DIAGNOSIS — Z23 Encounter for immunization: Secondary | ICD-10-CM | POA: Diagnosis not present

## 2021-04-20 DIAGNOSIS — Z961 Presence of intraocular lens: Secondary | ICD-10-CM | POA: Diagnosis not present

## 2021-04-20 DIAGNOSIS — E119 Type 2 diabetes mellitus without complications: Secondary | ICD-10-CM | POA: Diagnosis not present

## 2021-04-20 DIAGNOSIS — H5213 Myopia, bilateral: Secondary | ICD-10-CM | POA: Diagnosis not present

## 2021-05-02 DIAGNOSIS — Z125 Encounter for screening for malignant neoplasm of prostate: Secondary | ICD-10-CM | POA: Diagnosis not present

## 2021-05-02 DIAGNOSIS — E782 Mixed hyperlipidemia: Secondary | ICD-10-CM | POA: Diagnosis not present

## 2021-05-02 DIAGNOSIS — R809 Proteinuria, unspecified: Secondary | ICD-10-CM | POA: Diagnosis not present

## 2021-05-02 DIAGNOSIS — E1121 Type 2 diabetes mellitus with diabetic nephropathy: Secondary | ICD-10-CM | POA: Diagnosis not present

## 2021-05-15 ENCOUNTER — Ambulatory Visit: Payer: Medicare Other | Admitting: Cardiology

## 2021-05-16 DIAGNOSIS — E1121 Type 2 diabetes mellitus with diabetic nephropathy: Secondary | ICD-10-CM | POA: Diagnosis not present

## 2021-05-16 DIAGNOSIS — E782 Mixed hyperlipidemia: Secondary | ICD-10-CM | POA: Diagnosis not present

## 2021-05-16 DIAGNOSIS — R809 Proteinuria, unspecified: Secondary | ICD-10-CM | POA: Diagnosis not present

## 2021-05-18 ENCOUNTER — Ambulatory Visit: Payer: Medicare Other | Admitting: Cardiology

## 2021-05-18 ENCOUNTER — Other Ambulatory Visit: Payer: Self-pay

## 2021-05-18 ENCOUNTER — Encounter: Payer: Self-pay | Admitting: Cardiology

## 2021-05-18 VITALS — BP 137/78 | HR 77 | Temp 97.9°F | Resp 17 | Ht 77.0 in | Wt 239.0 lb

## 2021-05-18 DIAGNOSIS — I10 Essential (primary) hypertension: Secondary | ICD-10-CM | POA: Diagnosis not present

## 2021-05-18 DIAGNOSIS — I2510 Atherosclerotic heart disease of native coronary artery without angina pectoris: Secondary | ICD-10-CM | POA: Diagnosis not present

## 2021-05-18 MED ORDER — ASPIRIN EC 81 MG PO TBEC: 81.0000 mg | DELAYED_RELEASE_TABLET | Freq: Every day | ORAL | 3 refills | Status: DC

## 2021-05-18 NOTE — Progress Notes (Signed)
? ? ?Patient is here for follow up visit. ? ?Subjective:  ? ?[name], male    DOB: Feb 02, 1946, 76 y.o.   MRN: [id number] ? ? ?Chief Complaint  ?Patient presents with  ? Coronary Artery Disease  ?  1 YEAR  ? ? ?HPI ? ?77 year old Caucasian male with hypertension, hyperlipidemia, type II diabetes mellitus, coronary artery disease status post successful RCA PCI 01/2018. moderate nonobstructive LCx disease. ? ?Patient is doing well, denies chest pain, shortness of breath, palpitations, leg edema, orthopnea, PND, TIA/syncope.Reviewed recent test results with the patient, details below. Blood pressure slightly elevted, generally well controlled per the patient.  ? ? ?Current Outpatient Medications:  ?  aspirin 325 MG tablet, Take 325 mg by mouth daily., Disp: , Rfl:  ?  finasteride (PROSCAR) 5 MG tablet, Take 5 mg by mouth at bedtime. , Disp: , Rfl:  ?  losartan (COZAAR) 25 MG tablet, Take 1 tablet by mouth once daily (Patient taking differently: Take 25 mg by mouth daily.), Disp: 90 tablet, Rfl: 3 ?  metFORMIN (GLUCOPHAGE) 1000 MG tablet, Take 1,000 mg 2 (two) times daily by mouth. , Disp: , Rfl: 0 ?  rosuvastatin (CRESTOR) 20 MG tablet, Take 1 tablet by mouth once daily (Patient taking differently: Take 20 mg by mouth daily. Pt taking 3 times a week), Disp: 90 tablet, Rfl: 3 ?  tamsulosin (FLOMAX) 0.4 MG CAPS capsule, Take 0.4 mg by mouth at bedtime. , Disp: , Rfl:  ? ?Cardiovascular studies: ? ?EKG 05/18/2021: ?Sinus rhythm 78 bpm ?Low voltage in limb leads ? ?Coronary intervention 01/28/2017: ?Severe prox-mid RCA and Rt PDA stenoses ?Moderate LCx disease ?  ?Successful percutaneous coronary intervention  ?Direct stenting Resolute 2.25 x 12 mm PDA  ?PTCA and drug-eluting stent stent placement Resolute 4.0 x 30 mm proximal RCA ?Medical management for moderate LCx disease. ?  ? ?Echocardiogram 12/25/2016: ?Left ventricle cavity is normal in size. Moderate concentric hypertrophy of the left ventricle. Normal global  wall motion. Doppler evidence of grade I (impaired) diastolic dysfunction, normal LAP. Calculated EF 55%. ?No hemodynamically significant valvular abnormality. ?Normal right atrial pressure ? ?Recent labs: ?05/02/2021: ?Glucose 140, BUN/Cr 23/1.05. EGFR 75. Na/K 140/4.0. Rest of the CMP normal ?HbA1C 6.3% ?Chol 130, TG 77, HDL 47, LDL 68 ? ?03/21/2020: ?Glucose N/A, BUN/Cr 21/1.0. EGFR 60. ?HbA1C 6.0% ?Chol 103, TG 47, HDL 46, LDL 45 ?TSH N/A ? ?04/28/2019: ?Glucose 215, BUN/Cr 19/1.1. EGFR >60. Na/K 138/3.9.  ?H/H 13.8/40.7. MCV 89. Platelets 147 ? ? ?Review of Systems  ?Constitutional: Negative for malaise/fatigue and weight gain.  ?Cardiovascular:  Negative for chest pain, claudication, dyspnea on exertion, leg swelling, near-syncope, orthopnea, palpitations, paroxysmal nocturnal dyspnea and syncope.  ?Respiratory:  Negative for shortness of breath.   ?Neurological:  Negative for dizziness (resolved).  ? ?   ?Objective:  ? ? ?Vitals:  ? 05/18/21 1443 05/18/21 1449  ?BP: (!) 151/82 137/78  ?Pulse: 83 77  ?Resp: 17   ?Temp: 97.9 ?F (36.6 ?C)   ?SpO2: 98% 98%  ? ? ? Physical Exam ?Vitals and nursing note reviewed.  ?Constitutional:   ?   General: He is not in acute distress. ?   Appearance: He is well-developed.  ?Neck:  ?   Vascular: No JVD.  ?Cardiovascular:  ?   Rate and Rhythm: Normal rate and regular rhythm.  ?   Pulses: Intact distal pulses.  ?   Heart sounds: Normal heart sounds. No murmur heard. ?Pulmonary:  ?   Effort: Pulmonary  effort is normal.  ?   Breath sounds: Normal breath sounds. No wheezing or rales.  ?Musculoskeletal:  ?   Right lower leg: No edema.  ?   Left lower leg: No edema.  ?Physical exam unchanged compared to previous.  ? ? ?  ICD-10-CM   ?1. Essential hypertension, benign  I10 EKG 12-Lead  ?  ?2. Coronary artery disease involving native coronary artery of native heart without angina pectoris  I25.10 aspirin EC 81 MG tablet  ?  ? ?Meds ordered this encounter  ?Medications  ? aspirin EC 81 MG  tablet  ?  Sig: Take 1 tablet (81 mg total) by mouth daily. Swallow whole.  ?  Dispense:  90 tablet  ?  Refill:  3  ? ? ?   ?Assessment & Recommendations:  ? ?76 year old Caucasian male with hypertension, hyperlipidemia, type II diabetes mellitus, coronary artery disease status post successful RCA PCI 01/2018. moderate nonobstructive LCx disease. ? ?CAD without angina: ?Stable. Continue Aspirin, not 325 mg but 81 mg, statin. ?Consider tylenol for arthritis instead of 325 mg Aspirin ? ?Hypertension: ?Generally well controlled ? ?Hyperlipiemia: ?Controlled on rosuvastatin ? ?F?u in 1 year ? ? ?Shalom Mcguiness Esther Hardy, PA-C ?05/18/2021, 12:50 PM ?Office: [phone] ? ?

## 2021-06-08 DIAGNOSIS — R3912 Poor urinary stream: Secondary | ICD-10-CM | POA: Diagnosis not present

## 2021-06-08 DIAGNOSIS — N401 Enlarged prostate with lower urinary tract symptoms: Secondary | ICD-10-CM | POA: Diagnosis not present

## 2021-09-12 DIAGNOSIS — Z1389 Encounter for screening for other disorder: Secondary | ICD-10-CM | POA: Diagnosis not present

## 2021-09-12 DIAGNOSIS — Z23 Encounter for immunization: Secondary | ICD-10-CM | POA: Diagnosis not present

## 2021-09-12 DIAGNOSIS — Z0000 Encounter for general adult medical examination without abnormal findings: Secondary | ICD-10-CM | POA: Diagnosis not present

## 2021-11-07 DIAGNOSIS — E782 Mixed hyperlipidemia: Secondary | ICD-10-CM | POA: Diagnosis not present

## 2021-11-07 DIAGNOSIS — E1121 Type 2 diabetes mellitus with diabetic nephropathy: Secondary | ICD-10-CM | POA: Diagnosis not present

## 2021-11-07 DIAGNOSIS — R809 Proteinuria, unspecified: Secondary | ICD-10-CM | POA: Diagnosis not present

## 2021-11-14 DIAGNOSIS — I10 Essential (primary) hypertension: Secondary | ICD-10-CM | POA: Diagnosis not present

## 2021-11-14 DIAGNOSIS — E785 Hyperlipidemia, unspecified: Secondary | ICD-10-CM | POA: Diagnosis not present

## 2021-11-14 DIAGNOSIS — E118 Type 2 diabetes mellitus with unspecified complications: Secondary | ICD-10-CM | POA: Diagnosis not present

## 2022-02-27 DIAGNOSIS — L821 Other seborrheic keratosis: Secondary | ICD-10-CM | POA: Diagnosis not present

## 2022-02-27 DIAGNOSIS — L570 Actinic keratosis: Secondary | ICD-10-CM | POA: Diagnosis not present

## 2022-02-27 DIAGNOSIS — L578 Other skin changes due to chronic exposure to nonionizing radiation: Secondary | ICD-10-CM | POA: Diagnosis not present

## 2022-02-27 DIAGNOSIS — D485 Neoplasm of uncertain behavior of skin: Secondary | ICD-10-CM | POA: Diagnosis not present

## 2022-02-27 DIAGNOSIS — Z85828 Personal history of other malignant neoplasm of skin: Secondary | ICD-10-CM | POA: Diagnosis not present

## 2022-02-27 DIAGNOSIS — L723 Sebaceous cyst: Secondary | ICD-10-CM | POA: Diagnosis not present

## 2022-02-27 DIAGNOSIS — D1730 Benign lipomatous neoplasm of skin and subcutaneous tissue of unspecified sites: Secondary | ICD-10-CM | POA: Diagnosis not present

## 2022-02-27 DIAGNOSIS — D225 Melanocytic nevi of trunk: Secondary | ICD-10-CM | POA: Diagnosis not present

## 2022-05-15 DIAGNOSIS — E119 Type 2 diabetes mellitus without complications: Secondary | ICD-10-CM | POA: Diagnosis not present

## 2022-05-15 DIAGNOSIS — H43813 Vitreous degeneration, bilateral: Secondary | ICD-10-CM | POA: Diagnosis not present

## 2022-05-15 DIAGNOSIS — H524 Presbyopia: Secondary | ICD-10-CM | POA: Diagnosis not present

## 2022-05-15 DIAGNOSIS — Z961 Presence of intraocular lens: Secondary | ICD-10-CM | POA: Diagnosis not present

## 2022-05-15 DIAGNOSIS — H52203 Unspecified astigmatism, bilateral: Secondary | ICD-10-CM | POA: Diagnosis not present

## 2022-05-16 DIAGNOSIS — Z125 Encounter for screening for malignant neoplasm of prostate: Secondary | ICD-10-CM | POA: Diagnosis not present

## 2022-05-16 DIAGNOSIS — E119 Type 2 diabetes mellitus without complications: Secondary | ICD-10-CM | POA: Diagnosis not present

## 2022-05-16 DIAGNOSIS — E785 Hyperlipidemia, unspecified: Secondary | ICD-10-CM | POA: Diagnosis not present

## 2022-05-16 DIAGNOSIS — I10 Essential (primary) hypertension: Secondary | ICD-10-CM | POA: Diagnosis not present

## 2022-05-16 DIAGNOSIS — I2510 Atherosclerotic heart disease of native coronary artery without angina pectoris: Secondary | ICD-10-CM | POA: Diagnosis not present

## 2022-05-21 ENCOUNTER — Ambulatory Visit: Payer: Medicare Other | Admitting: Cardiology

## 2022-05-21 ENCOUNTER — Encounter: Payer: Self-pay | Admitting: Cardiology

## 2022-05-21 VITALS — BP 103/69 | HR 83 | Ht 77.0 in | Wt 244.2 lb

## 2022-05-21 DIAGNOSIS — I2510 Atherosclerotic heart disease of native coronary artery without angina pectoris: Secondary | ICD-10-CM | POA: Diagnosis not present

## 2022-05-21 DIAGNOSIS — I10 Essential (primary) hypertension: Secondary | ICD-10-CM | POA: Diagnosis not present

## 2022-05-21 MED ORDER — ASPIRIN 81 MG PO TBEC: 81.0000 mg | DELAYED_RELEASE_TABLET | Freq: Every day | ORAL | 3 refills | Status: AC

## 2022-05-21 MED ORDER — LOSARTAN POTASSIUM 25 MG PO TABS: 25.0000 mg | ORAL_TABLET | Freq: Every day | ORAL | 3 refills | Status: DC

## 2022-05-21 MED ORDER — ROSUVASTATIN CALCIUM 20 MG PO TABS: 20.0000 mg | ORAL_TABLET | Freq: Every day | ORAL | 3 refills | Status: DC

## 2022-05-21 NOTE — Progress Notes (Signed)
Patient is here for follow up visit.  Subjective:   [name], male    DOB: Jul 13, 1945, 77 y.o.   MRN: [id number]   Chief Complaint  Patient presents with   Hypertension   Follow-up    HPI  76 year old Caucasian male with hypertension, hyperlipidemia, type II diabetes mellitus, coronary artery disease status post successful RCA PCI 01/2018. moderate nonobstructive LCx disease.  Patient is doing well, denies chest pain, shortness of breath, palpitations, leg edema, orthopnea, PND, TIA/syncope.Reviewed recent test results with the patient, details below.    Current Outpatient Medications:    aspirin EC 81 MG tablet, Take 1 tablet (81 mg total) by mouth daily. Swallow whole., Disp: 90 tablet, Rfl: 3   finasteride (PROSCAR) 5 MG tablet, Take 5 mg by mouth at bedtime. , Disp: , Rfl:    losartan (COZAAR) 25 MG tablet, Take 1 tablet by mouth once daily (Patient taking differently: Take 25 mg by mouth daily.), Disp: 90 tablet, Rfl: 3   metFORMIN (GLUCOPHAGE) 1000 MG tablet, Take 1,000 mg 2 (two) times daily by mouth. , Disp: , Rfl: 0   rosuvastatin (CRESTOR) 20 MG tablet, Take 1 tablet by mouth once daily (Patient taking differently: Take 20 mg by mouth daily. Pt taking 3 times a week), Disp: 90 tablet, Rfl: 3   tamsulosin (FLOMAX) 0.4 MG CAPS capsule, Take 0.4 mg by mouth at bedtime. , Disp: , Rfl:   Cardiovascular studies:  EKG 05/21/2022: Sinus rhythm 80 bpm  Normal EKG  Coronary intervention 01/28/2017: Severe prox-mid RCA and Rt PDA stenoses Moderate LCx disease   Successful percutaneous coronary intervention  Direct stenting Resolute 2.25 x 12 mm PDA  PTCA and drug-eluting stent stent placement Resolute 4.0 x 30 mm proximal RCA Medical management for moderate LCx disease.   Echocardiogram 12/25/2016: Left ventricle cavity is normal in size. Moderate concentric hypertrophy of the left ventricle. Normal global wall motion. Doppler evidence of grade I (impaired)  diastolic dysfunction, normal LAP. Calculated EF 55%. No hemodynamically significant valvular abnormality. Normal right atrial pressure  Recent labs: 05/16/2022: Glucose 150, BUN/Cr 23/1.0. EGFR 75. K 4.3. Hb 13.8. HbA1C 6.8% Chol 126, TG 116, HDL 45, LDL 61  Review of Systems  Constitutional: Negative for malaise/fatigue and weight gain.  Cardiovascular:  Negative for chest pain, claudication, dyspnea on exertion, leg swelling, near-syncope, orthopnea, palpitations, paroxysmal nocturnal dyspnea and syncope.  Respiratory:  Negative for shortness of breath.   Neurological:  Negative for dizziness (resolved).       Objective:    Vitals:   05/21/22 1310 05/21/22 1315  BP: (!) 150/81 103/69  Pulse: 82 83  SpO2: 98%      Physical Exam Vitals and nursing note reviewed.  Constitutional:      General: He is not in acute distress.    Appearance: He is well-developed.  Neck:     Vascular: No JVD.  Cardiovascular:     Rate and Rhythm: Normal rate and regular rhythm.     Pulses: Intact distal pulses.     Heart sounds: Normal heart sounds. No murmur heard. Pulmonary:     Effort: Pulmonary effort is normal.     Breath sounds: Normal breath sounds. No wheezing or rales.  Musculoskeletal:     Right lower leg: No edema.     Left lower leg: No edema.        ICD-10-CM   1. Coronary artery disease involving native coronary artery of native heart without angina pectoris  I25.10 EKG 12-Lead    2. Essential hypertension, benign  I10 EKG 12-Lead     Meds ordered this encounter  Medications   rosuvastatin (CRESTOR) 20 MG tablet    Sig: Take 1 tablet (20 mg total) by mouth daily.    Dispense:  90 tablet    Refill:  3   losartan (COZAAR) 25 MG tablet    Sig: Take 1 tablet (25 mg total) by mouth daily.    Dispense:  90 tablet    Refill:  3   aspirin EC 81 MG tablet    Sig: Take 1 tablet (81 mg total) by mouth daily. Swallow whole.    Dispense:  90 tablet    Refill:  3        Assessment & Recommendations:   77 year old Caucasian male with hypertension, hyperlipidemia, type II diabetes mellitus, CAD  CAD: RCA PCI 01/2018. moderate nonobstructive LCx disease. No angina symptoms. Continue Aspirin, statin  Hypertension: Well controlled  Hyperlipiemia: Controlled  Refilled meds  F/u in 1 year   [employer], PA-C 05/21/2022, 11:13 AM Office: [phone]

## 2022-06-18 DIAGNOSIS — Z23 Encounter for immunization: Secondary | ICD-10-CM | POA: Diagnosis not present

## 2022-07-19 DIAGNOSIS — N401 Enlarged prostate with lower urinary tract symptoms: Secondary | ICD-10-CM | POA: Diagnosis not present

## 2022-07-19 DIAGNOSIS — R3912 Poor urinary stream: Secondary | ICD-10-CM | POA: Diagnosis not present

## 2022-07-20 DIAGNOSIS — Z6828 Body mass index (BMI) 28.0-28.9, adult: Secondary | ICD-10-CM | POA: Diagnosis not present

## 2022-07-20 DIAGNOSIS — M7918 Myalgia, other site: Secondary | ICD-10-CM | POA: Diagnosis not present

## 2022-09-04 DIAGNOSIS — L570 Actinic keratosis: Secondary | ICD-10-CM | POA: Diagnosis not present

## 2022-09-04 DIAGNOSIS — Z86018 Personal history of other benign neoplasm: Secondary | ICD-10-CM | POA: Diagnosis not present

## 2022-09-04 DIAGNOSIS — D239 Other benign neoplasm of skin, unspecified: Secondary | ICD-10-CM | POA: Diagnosis not present

## 2022-09-25 DIAGNOSIS — E785 Hyperlipidemia, unspecified: Secondary | ICD-10-CM | POA: Diagnosis not present

## 2022-09-25 DIAGNOSIS — E119 Type 2 diabetes mellitus without complications: Secondary | ICD-10-CM | POA: Diagnosis not present

## 2022-09-25 DIAGNOSIS — I10 Essential (primary) hypertension: Secondary | ICD-10-CM | POA: Diagnosis not present

## 2023-01-21 DIAGNOSIS — T148XXA Other injury of unspecified body region, initial encounter: Secondary | ICD-10-CM | POA: Diagnosis not present

## 2023-01-21 DIAGNOSIS — Z6828 Body mass index (BMI) 28.0-28.9, adult: Secondary | ICD-10-CM | POA: Diagnosis not present

## 2023-01-21 DIAGNOSIS — E119 Type 2 diabetes mellitus without complications: Secondary | ICD-10-CM | POA: Diagnosis not present

## 2023-03-06 DIAGNOSIS — L570 Actinic keratosis: Secondary | ICD-10-CM | POA: Diagnosis not present

## 2023-03-06 DIAGNOSIS — D485 Neoplasm of uncertain behavior of skin: Secondary | ICD-10-CM | POA: Diagnosis not present

## 2023-03-06 DIAGNOSIS — L578 Other skin changes due to chronic exposure to nonionizing radiation: Secondary | ICD-10-CM | POA: Diagnosis not present

## 2023-03-06 DIAGNOSIS — D225 Melanocytic nevi of trunk: Secondary | ICD-10-CM | POA: Diagnosis not present

## 2023-03-06 DIAGNOSIS — L821 Other seborrheic keratosis: Secondary | ICD-10-CM | POA: Diagnosis not present

## 2023-03-06 DIAGNOSIS — D1730 Benign lipomatous neoplasm of skin and subcutaneous tissue of unspecified sites: Secondary | ICD-10-CM | POA: Diagnosis not present

## 2023-03-06 DIAGNOSIS — Z85828 Personal history of other malignant neoplasm of skin: Secondary | ICD-10-CM | POA: Diagnosis not present

## 2023-03-21 DIAGNOSIS — I10 Essential (primary) hypertension: Secondary | ICD-10-CM | POA: Diagnosis not present

## 2023-03-21 DIAGNOSIS — R809 Proteinuria, unspecified: Secondary | ICD-10-CM | POA: Diagnosis not present

## 2023-03-21 DIAGNOSIS — E785 Hyperlipidemia, unspecified: Secondary | ICD-10-CM | POA: Diagnosis not present

## 2023-03-21 DIAGNOSIS — E1129 Type 2 diabetes mellitus with other diabetic kidney complication: Secondary | ICD-10-CM | POA: Diagnosis not present

## 2023-04-23 DIAGNOSIS — N390 Urinary tract infection, site not specified: Secondary | ICD-10-CM | POA: Diagnosis not present

## 2023-04-23 DIAGNOSIS — R300 Dysuria: Secondary | ICD-10-CM | POA: Diagnosis not present

## 2023-04-23 DIAGNOSIS — Z6828 Body mass index (BMI) 28.0-28.9, adult: Secondary | ICD-10-CM | POA: Diagnosis not present

## 2023-05-22 ENCOUNTER — Ambulatory Visit: Payer: Medicare Other | Admitting: Cardiology

## 2023-06-03 ENCOUNTER — Other Ambulatory Visit: Payer: Self-pay | Admitting: Cardiology

## 2023-06-11 DIAGNOSIS — I2510 Atherosclerotic heart disease of native coronary artery without angina pectoris: Secondary | ICD-10-CM | POA: Diagnosis not present

## 2023-06-11 DIAGNOSIS — E119 Type 2 diabetes mellitus without complications: Secondary | ICD-10-CM | POA: Diagnosis not present

## 2023-06-11 DIAGNOSIS — I10 Essential (primary) hypertension: Secondary | ICD-10-CM | POA: Diagnosis not present

## 2023-06-11 DIAGNOSIS — E1129 Type 2 diabetes mellitus with other diabetic kidney complication: Secondary | ICD-10-CM | POA: Diagnosis not present

## 2023-06-17 DIAGNOSIS — I10 Essential (primary) hypertension: Secondary | ICD-10-CM | POA: Diagnosis not present

## 2023-06-17 DIAGNOSIS — E119 Type 2 diabetes mellitus without complications: Secondary | ICD-10-CM | POA: Diagnosis not present

## 2023-06-17 DIAGNOSIS — I2510 Atherosclerotic heart disease of native coronary artery without angina pectoris: Secondary | ICD-10-CM | POA: Diagnosis not present

## 2023-06-17 DIAGNOSIS — E1129 Type 2 diabetes mellitus with other diabetic kidney complication: Secondary | ICD-10-CM | POA: Diagnosis not present

## 2023-06-17 DIAGNOSIS — E785 Hyperlipidemia, unspecified: Secondary | ICD-10-CM | POA: Diagnosis not present

## 2023-06-19 ENCOUNTER — Ambulatory Visit: Payer: Medicare Other | Attending: Cardiology | Admitting: Cardiology

## 2023-06-19 ENCOUNTER — Encounter: Payer: Self-pay | Admitting: Cardiology

## 2023-06-19 VITALS — BP 120/78 | HR 63 | Resp 16 | Ht 77.0 in | Wt 234.4 lb

## 2023-06-19 DIAGNOSIS — I2510 Atherosclerotic heart disease of native coronary artery without angina pectoris: Secondary | ICD-10-CM | POA: Diagnosis not present

## 2023-06-19 DIAGNOSIS — I10 Essential (primary) hypertension: Secondary | ICD-10-CM | POA: Diagnosis not present

## 2023-06-19 DIAGNOSIS — R011 Cardiac murmur, unspecified: Secondary | ICD-10-CM | POA: Diagnosis not present

## 2023-06-19 DIAGNOSIS — E782 Mixed hyperlipidemia: Secondary | ICD-10-CM | POA: Diagnosis not present

## 2023-06-19 DIAGNOSIS — E119 Type 2 diabetes mellitus without complications: Secondary | ICD-10-CM | POA: Insufficient documentation

## 2023-06-19 NOTE — Progress Notes (Signed)
 Cardiology Office Note:  .   Date:  06/19/2023  ID:  [name], DOB Aug 11, 1945, MRN [id number] PCP: Ileana Ladd, MD (Inactive)  [name] Cardiologist:  Truett Mainland, MD PCP: Ileana Ladd, MD (Inactive)  Chief Complaint  Patient presents with   Coronary artery disease involving native coronary artery of   Follow-up     [name] is a 78 y.o. male with hypertension, hyperlipidemia, type II DM, CAD  Patient underwent PCI to proximal RCA for stable anginal symptoms and abnormal stress test, in 2018.  He is doing well.  He stays active with regular physical exercise and activity.  He denies any complains of chest pain or shortness of breath.  He is not on [name] for last couple months with borderline diabetes, also takes metformin.  He feels occasional tightness, is improved with increasing hydration.  Reviewed lipid panel results, details below     Vitals:   06/19/23 1019  BP: 120/78  Pulse: 63  Resp: 16  SpO2: 99%      Review of Systems  Cardiovascular:  Negative for chest pain, dyspnea on exertion, leg swelling, palpitations and syncope.        Studies Reviewed: Marland Kitchen        EKG 06/19/2023: Normal sinus rhythm with sinus arrhythmia Normal ECG When compared with ECG of 28-Apr-2019 11:53, No significant change was found    Independently interpreted 03/2023: Chol 123, TG 112, HDL 45, LDL 58 HbA1C 6.4% Hb 13.6 Cr 1.0  Coronary intervention 01/28/2017: Severe prox-mid RCA and Rt PDA stenoses Moderate LCx disease Successful percutaneous coronary intervention  Direct stenting Resolute 2.25 x 12 mm PDA  PTCA and drug-eluting stent stent placement Resolute 4.0 x 30 mm proximal RCA Medical management for moderate LCx disease.   Physical Exam Vitals and nursing note reviewed.  Constitutional:      General: He is not in acute distress. Neck:     Vascular: No JVD.  Cardiovascular:     Rate and Rhythm: Normal rate and  regular rhythm.     Heart sounds: Murmur heard.     High-pitched blowing holosystolic murmur is present with a grade of 2/6 at the apex.  Pulmonary:     Effort: Pulmonary effort is normal.     Breath sounds: Normal breath sounds. No wheezing or rales.  Musculoskeletal:     Right lower leg: No edema.     Left lower leg: No edema.      VISIT DIAGNOSES:   ICD-10-CM   1. Coronary artery disease involving native coronary artery of native heart without angina pectoris  I25.10 EKG 12-Lead    2. Primary hypertension  I10     3. Mixed hyperlipidemia  E78.2     4. Murmur  R01.1     5. Controlled type 2 diabetes mellitus without complication, with long-term current use of insulin (HCC)  E11.9    Z79.4        [name] is a 78 y.o. male with hypertension, hyperlipidemia, type II DM, CAD  Assessment & Plan:  CAD: RCA PCI 01/2017. moderate nonobstructive LCx disease. No angina symptoms. He has stopped taking aspirin due to bruising, encouraged him to take it at least couple times a week, if not daily. Continue Crestor 10 mg daily, lipids very well-controlled.  Hypertension: Well controlled   Mixed hyperlipidemia: Lipids very well controlled  Murmur: Suspect mild mitral regurgitation.  Will check echocardiogram.     F/u in  1 year  Signed, Elder Negus, MD

## 2023-06-19 NOTE — Patient Instructions (Signed)
 Medication Instructions:   Your physician recommends that you continue on your current medications as directed. Please refer to the Current Medication list given to you today.  *If you need a refill on your cardiac medications before your next appointment, please call your pharmacy*   Testing/Procedures:  Your physician has requested that you have an echocardiogram. Echocardiography is a painless test that uses sound waves to create images of your heart. It provides your doctor with information about the size and shape of your heart and how well your heart's chambers and valves are working. This procedure takes approximately one hour. There are no restrictions for this procedure. Please do NOT wear cologne, perfume, aftershave, or lotions (deodorant is allowed). Please arrive 15 minutes prior to your appointment time.  Please note: We ask at that you not bring children with you during ultrasound (echo/ vascular) testing. Due to room size and safety concerns, children are not allowed in the ultrasound rooms during exams. Our front office staff cannot provide observation of children in our lobby area while testing is being conducted. An adult accompanying a patient to their appointment will only be allowed in the ultrasound room at the discretion of the ultrasound technician under special circumstances. We apologize for any inconvenience.   Follow-Up: At [facility], you and your health needs are our priority.  As part of our continuing mission to provide you with exceptional heart care, our providers are all part of one team.  This team includes your primary Cardiologist (physician) and Advanced Practice Providers or APPs (Physician Assistants and Nurse Practitioners) who all work together to provide you with the care you need, when you need it.  Your next appointment:   1 year(s)  Provider:   Dr. Rosemary Holms         1st Floor: - Lobby - Registration  - Pharmacy  - Lab -  [facility]  2nd Floor: - PV Lab - Diagnostic Testing (echo, CT, nuclear med)  3rd Floor: - Vacant  4th Floor: - TCTS (cardiothoracic surgery) - AFib Clinic - Structural Heart Clinic - Vascular Surgery  - Vascular Ultrasound  5th Floor: - [facility] (general and EP) - Clinical Pharmacy for coumadin, hypertension, lipid, weight-loss medications, and med management appointments    Valet parking services will be available as well.

## 2023-07-04 ENCOUNTER — Other Ambulatory Visit: Payer: Self-pay | Admitting: Cardiology

## 2023-07-10 DIAGNOSIS — E1129 Type 2 diabetes mellitus with other diabetic kidney complication: Secondary | ICD-10-CM | POA: Diagnosis not present

## 2023-07-10 DIAGNOSIS — I10 Essential (primary) hypertension: Secondary | ICD-10-CM | POA: Diagnosis not present

## 2023-07-10 DIAGNOSIS — I2510 Atherosclerotic heart disease of native coronary artery without angina pectoris: Secondary | ICD-10-CM | POA: Diagnosis not present

## 2023-07-10 DIAGNOSIS — E119 Type 2 diabetes mellitus without complications: Secondary | ICD-10-CM | POA: Diagnosis not present

## 2023-07-16 DIAGNOSIS — D489 Neoplasm of uncertain behavior, unspecified: Secondary | ICD-10-CM | POA: Diagnosis not present

## 2023-07-16 DIAGNOSIS — D485 Neoplasm of uncertain behavior of skin: Secondary | ICD-10-CM | POA: Diagnosis not present

## 2023-07-17 DIAGNOSIS — I2510 Atherosclerotic heart disease of native coronary artery without angina pectoris: Secondary | ICD-10-CM | POA: Diagnosis not present

## 2023-07-17 DIAGNOSIS — I10 Essential (primary) hypertension: Secondary | ICD-10-CM | POA: Diagnosis not present

## 2023-07-17 DIAGNOSIS — E119 Type 2 diabetes mellitus without complications: Secondary | ICD-10-CM | POA: Diagnosis not present

## 2023-07-17 DIAGNOSIS — E785 Hyperlipidemia, unspecified: Secondary | ICD-10-CM | POA: Diagnosis not present

## 2023-07-17 DIAGNOSIS — E1129 Type 2 diabetes mellitus with other diabetic kidney complication: Secondary | ICD-10-CM | POA: Diagnosis not present

## 2023-07-29 DIAGNOSIS — N401 Enlarged prostate with lower urinary tract symptoms: Secondary | ICD-10-CM | POA: Diagnosis not present

## 2023-07-30 ENCOUNTER — Ambulatory Visit (HOSPITAL_COMMUNITY): Attending: Cardiovascular Disease

## 2023-07-30 DIAGNOSIS — R011 Cardiac murmur, unspecified: Secondary | ICD-10-CM | POA: Diagnosis not present

## 2023-07-30 LAB — ECHOCARDIOGRAM COMPLETE
Area-P 1/2: 3.01 cm2
S' Lateral: 3 cm

## 2023-07-31 ENCOUNTER — Ambulatory Visit: Payer: Self-pay | Admitting: Cardiology

## 2023-07-31 DIAGNOSIS — I25118 Atherosclerotic heart disease of native coronary artery with other forms of angina pectoris: Secondary | ICD-10-CM

## 2023-07-31 NOTE — Progress Notes (Signed)
 Reviewed echocardiogram.  Chamber sizes likely normal for his body surface area, and not truly dilated.  Overall heart function is normal.  There is some thickening of the mitral valve which could be affecting accurate evaluation of diastolic function, there is relaxation of the heart.  Overall, I am pleased with the findings and do not suspect any major issues.  Recommend repeat echocardiogram in 1 year before next year follow-up.  Diagnosis for echocardiogram: Coronary artery disease.  Thanks MJP

## 2023-08-01 NOTE — Telephone Encounter (Signed)
 Pt returning call

## 2023-08-05 DIAGNOSIS — R3912 Poor urinary stream: Secondary | ICD-10-CM | POA: Diagnosis not present

## 2023-08-05 DIAGNOSIS — N401 Enlarged prostate with lower urinary tract symptoms: Secondary | ICD-10-CM | POA: Diagnosis not present

## 2023-08-09 DIAGNOSIS — E1129 Type 2 diabetes mellitus with other diabetic kidney complication: Secondary | ICD-10-CM | POA: Diagnosis not present

## 2023-08-09 DIAGNOSIS — E119 Type 2 diabetes mellitus without complications: Secondary | ICD-10-CM | POA: Diagnosis not present

## 2023-08-09 DIAGNOSIS — I2510 Atherosclerotic heart disease of native coronary artery without angina pectoris: Secondary | ICD-10-CM | POA: Diagnosis not present

## 2023-08-09 DIAGNOSIS — I10 Essential (primary) hypertension: Secondary | ICD-10-CM | POA: Diagnosis not present

## 2023-08-17 DIAGNOSIS — I2510 Atherosclerotic heart disease of native coronary artery without angina pectoris: Secondary | ICD-10-CM | POA: Diagnosis not present

## 2023-08-17 DIAGNOSIS — E119 Type 2 diabetes mellitus without complications: Secondary | ICD-10-CM | POA: Diagnosis not present

## 2023-08-17 DIAGNOSIS — E785 Hyperlipidemia, unspecified: Secondary | ICD-10-CM | POA: Diagnosis not present

## 2023-08-17 DIAGNOSIS — I10 Essential (primary) hypertension: Secondary | ICD-10-CM | POA: Diagnosis not present

## 2023-08-17 DIAGNOSIS — E1129 Type 2 diabetes mellitus with other diabetic kidney complication: Secondary | ICD-10-CM | POA: Diagnosis not present

## 2023-09-08 DIAGNOSIS — I2510 Atherosclerotic heart disease of native coronary artery without angina pectoris: Secondary | ICD-10-CM | POA: Diagnosis not present

## 2023-09-08 DIAGNOSIS — E1129 Type 2 diabetes mellitus with other diabetic kidney complication: Secondary | ICD-10-CM | POA: Diagnosis not present

## 2023-09-08 DIAGNOSIS — I10 Essential (primary) hypertension: Secondary | ICD-10-CM | POA: Diagnosis not present

## 2023-09-08 DIAGNOSIS — E119 Type 2 diabetes mellitus without complications: Secondary | ICD-10-CM | POA: Diagnosis not present

## 2023-09-16 DIAGNOSIS — I2510 Atherosclerotic heart disease of native coronary artery without angina pectoris: Secondary | ICD-10-CM | POA: Diagnosis not present

## 2023-09-16 DIAGNOSIS — E785 Hyperlipidemia, unspecified: Secondary | ICD-10-CM | POA: Diagnosis not present

## 2023-09-16 DIAGNOSIS — E119 Type 2 diabetes mellitus without complications: Secondary | ICD-10-CM | POA: Diagnosis not present

## 2023-09-16 DIAGNOSIS — E1129 Type 2 diabetes mellitus with other diabetic kidney complication: Secondary | ICD-10-CM | POA: Diagnosis not present

## 2023-09-16 DIAGNOSIS — I10 Essential (primary) hypertension: Secondary | ICD-10-CM | POA: Diagnosis not present

## 2023-10-08 DIAGNOSIS — E119 Type 2 diabetes mellitus without complications: Secondary | ICD-10-CM | POA: Diagnosis not present

## 2023-10-08 DIAGNOSIS — I2510 Atherosclerotic heart disease of native coronary artery without angina pectoris: Secondary | ICD-10-CM | POA: Diagnosis not present

## 2023-10-08 DIAGNOSIS — E1129 Type 2 diabetes mellitus with other diabetic kidney complication: Secondary | ICD-10-CM | POA: Diagnosis not present

## 2023-10-08 DIAGNOSIS — I10 Essential (primary) hypertension: Secondary | ICD-10-CM | POA: Diagnosis not present

## 2023-10-11 DIAGNOSIS — Z0000 Encounter for general adult medical examination without abnormal findings: Secondary | ICD-10-CM | POA: Diagnosis not present

## 2023-10-11 DIAGNOSIS — Z1211 Encounter for screening for malignant neoplasm of colon: Secondary | ICD-10-CM | POA: Diagnosis not present

## 2023-10-11 DIAGNOSIS — E785 Hyperlipidemia, unspecified: Secondary | ICD-10-CM | POA: Diagnosis not present

## 2023-10-11 DIAGNOSIS — I2510 Atherosclerotic heart disease of native coronary artery without angina pectoris: Secondary | ICD-10-CM | POA: Diagnosis not present

## 2023-10-11 DIAGNOSIS — R809 Proteinuria, unspecified: Secondary | ICD-10-CM | POA: Diagnosis not present

## 2023-10-11 DIAGNOSIS — I10 Essential (primary) hypertension: Secondary | ICD-10-CM | POA: Diagnosis not present

## 2023-10-11 DIAGNOSIS — E1129 Type 2 diabetes mellitus with other diabetic kidney complication: Secondary | ICD-10-CM | POA: Diagnosis not present

## 2023-10-11 DIAGNOSIS — Z125 Encounter for screening for malignant neoplasm of prostate: Secondary | ICD-10-CM | POA: Diagnosis not present

## 2023-10-14 ENCOUNTER — Other Ambulatory Visit: Payer: Self-pay | Admitting: Cardiology

## 2023-10-17 DIAGNOSIS — E119 Type 2 diabetes mellitus without complications: Secondary | ICD-10-CM | POA: Diagnosis not present

## 2023-10-17 DIAGNOSIS — E1129 Type 2 diabetes mellitus with other diabetic kidney complication: Secondary | ICD-10-CM | POA: Diagnosis not present

## 2023-10-17 DIAGNOSIS — E785 Hyperlipidemia, unspecified: Secondary | ICD-10-CM | POA: Diagnosis not present

## 2023-10-17 DIAGNOSIS — I2510 Atherosclerotic heart disease of native coronary artery without angina pectoris: Secondary | ICD-10-CM | POA: Diagnosis not present

## 2023-10-17 DIAGNOSIS — I10 Essential (primary) hypertension: Secondary | ICD-10-CM | POA: Diagnosis not present

## 2023-11-07 DIAGNOSIS — I2510 Atherosclerotic heart disease of native coronary artery without angina pectoris: Secondary | ICD-10-CM | POA: Diagnosis not present

## 2023-11-07 DIAGNOSIS — E119 Type 2 diabetes mellitus without complications: Secondary | ICD-10-CM | POA: Diagnosis not present

## 2023-11-07 DIAGNOSIS — I10 Essential (primary) hypertension: Secondary | ICD-10-CM | POA: Diagnosis not present

## 2023-11-07 DIAGNOSIS — E1129 Type 2 diabetes mellitus with other diabetic kidney complication: Secondary | ICD-10-CM | POA: Diagnosis not present

## 2023-11-17 DIAGNOSIS — E119 Type 2 diabetes mellitus without complications: Secondary | ICD-10-CM | POA: Diagnosis not present

## 2023-11-17 DIAGNOSIS — E785 Hyperlipidemia, unspecified: Secondary | ICD-10-CM | POA: Diagnosis not present

## 2023-11-17 DIAGNOSIS — I10 Essential (primary) hypertension: Secondary | ICD-10-CM | POA: Diagnosis not present

## 2023-11-17 DIAGNOSIS — I2510 Atherosclerotic heart disease of native coronary artery without angina pectoris: Secondary | ICD-10-CM | POA: Diagnosis not present

## 2023-11-17 DIAGNOSIS — E1129 Type 2 diabetes mellitus with other diabetic kidney complication: Secondary | ICD-10-CM | POA: Diagnosis not present

## 2023-12-07 DIAGNOSIS — I2510 Atherosclerotic heart disease of native coronary artery without angina pectoris: Secondary | ICD-10-CM | POA: Diagnosis not present

## 2023-12-07 DIAGNOSIS — E1129 Type 2 diabetes mellitus with other diabetic kidney complication: Secondary | ICD-10-CM | POA: Diagnosis not present

## 2023-12-07 DIAGNOSIS — I10 Essential (primary) hypertension: Secondary | ICD-10-CM | POA: Diagnosis not present

## 2023-12-07 DIAGNOSIS — E119 Type 2 diabetes mellitus without complications: Secondary | ICD-10-CM | POA: Diagnosis not present

## 2023-12-12 DIAGNOSIS — Z86018 Personal history of other benign neoplasm: Secondary | ICD-10-CM | POA: Diagnosis not present

## 2023-12-12 DIAGNOSIS — I2510 Atherosclerotic heart disease of native coronary artery without angina pectoris: Secondary | ICD-10-CM | POA: Diagnosis not present

## 2023-12-17 DIAGNOSIS — I2510 Atherosclerotic heart disease of native coronary artery without angina pectoris: Secondary | ICD-10-CM | POA: Diagnosis not present

## 2023-12-17 DIAGNOSIS — E785 Hyperlipidemia, unspecified: Secondary | ICD-10-CM | POA: Diagnosis not present

## 2023-12-17 DIAGNOSIS — E119 Type 2 diabetes mellitus without complications: Secondary | ICD-10-CM | POA: Diagnosis not present

## 2023-12-17 DIAGNOSIS — I10 Essential (primary) hypertension: Secondary | ICD-10-CM | POA: Diagnosis not present

## 2023-12-17 DIAGNOSIS — E1129 Type 2 diabetes mellitus with other diabetic kidney complication: Secondary | ICD-10-CM | POA: Diagnosis not present

## 2024-01-06 DIAGNOSIS — E1129 Type 2 diabetes mellitus with other diabetic kidney complication: Secondary | ICD-10-CM | POA: Diagnosis not present

## 2024-01-06 DIAGNOSIS — I10 Essential (primary) hypertension: Secondary | ICD-10-CM | POA: Diagnosis not present

## 2024-01-06 DIAGNOSIS — I2510 Atherosclerotic heart disease of native coronary artery without angina pectoris: Secondary | ICD-10-CM | POA: Diagnosis not present

## 2024-01-06 DIAGNOSIS — E119 Type 2 diabetes mellitus without complications: Secondary | ICD-10-CM | POA: Diagnosis not present

## 2024-01-17 DIAGNOSIS — I10 Essential (primary) hypertension: Secondary | ICD-10-CM | POA: Diagnosis not present

## 2024-01-17 DIAGNOSIS — I2510 Atherosclerotic heart disease of native coronary artery without angina pectoris: Secondary | ICD-10-CM | POA: Diagnosis not present

## 2024-01-17 DIAGNOSIS — E119 Type 2 diabetes mellitus without complications: Secondary | ICD-10-CM | POA: Diagnosis not present

## 2024-01-17 DIAGNOSIS — E785 Hyperlipidemia, unspecified: Secondary | ICD-10-CM | POA: Diagnosis not present

## 2024-01-17 DIAGNOSIS — E1129 Type 2 diabetes mellitus with other diabetic kidney complication: Secondary | ICD-10-CM | POA: Diagnosis not present

## 2024-02-04 DIAGNOSIS — Z860101 Personal history of adenomatous and serrated colon polyps: Secondary | ICD-10-CM | POA: Diagnosis not present

## 2024-02-04 DIAGNOSIS — Z09 Encounter for follow-up examination after completed treatment for conditions other than malignant neoplasm: Secondary | ICD-10-CM | POA: Diagnosis not present

## 2024-02-04 DIAGNOSIS — K621 Rectal polyp: Secondary | ICD-10-CM | POA: Diagnosis not present

## 2024-02-05 DIAGNOSIS — E1129 Type 2 diabetes mellitus with other diabetic kidney complication: Secondary | ICD-10-CM | POA: Diagnosis not present

## 2024-02-05 DIAGNOSIS — I2510 Atherosclerotic heart disease of native coronary artery without angina pectoris: Secondary | ICD-10-CM | POA: Diagnosis not present

## 2024-02-05 DIAGNOSIS — I10 Essential (primary) hypertension: Secondary | ICD-10-CM | POA: Diagnosis not present

## 2024-02-05 DIAGNOSIS — E119 Type 2 diabetes mellitus without complications: Secondary | ICD-10-CM | POA: Diagnosis not present

## 2024-02-06 DIAGNOSIS — K621 Rectal polyp: Secondary | ICD-10-CM | POA: Diagnosis not present

## 2024-02-16 DIAGNOSIS — I2510 Atherosclerotic heart disease of native coronary artery without angina pectoris: Secondary | ICD-10-CM | POA: Diagnosis not present

## 2024-02-16 DIAGNOSIS — E1129 Type 2 diabetes mellitus with other diabetic kidney complication: Secondary | ICD-10-CM | POA: Diagnosis not present

## 2024-02-16 DIAGNOSIS — E119 Type 2 diabetes mellitus without complications: Secondary | ICD-10-CM | POA: Diagnosis not present

## 2024-02-16 DIAGNOSIS — E785 Hyperlipidemia, unspecified: Secondary | ICD-10-CM | POA: Diagnosis not present

## 2024-02-16 DIAGNOSIS — I10 Essential (primary) hypertension: Secondary | ICD-10-CM | POA: Diagnosis not present

## 2024-02-22 DIAGNOSIS — Z23 Encounter for immunization: Secondary | ICD-10-CM | POA: Diagnosis not present
